# Patient Record
Sex: Male | Born: 1935
Health system: Southern US, Community
[De-identification: ages and names within clinical notes are randomized; demographics above are authoritative.]

## PROBLEM LIST (undated history)

## (undated) ENCOUNTER — Emergency Department (HOSPITAL_COMMUNITY): Discharge status: Absent without leave | Payer: Medicare Other

## (undated) DIAGNOSIS — E785 Hyperlipidemia, unspecified: Secondary | ICD-10-CM

## (undated) DIAGNOSIS — R7611 Nonspecific reaction to tuberculin skin test without active tuberculosis: Secondary | ICD-10-CM

## (undated) DIAGNOSIS — K219 Gastro-esophageal reflux disease without esophagitis: Secondary | ICD-10-CM

## (undated) DIAGNOSIS — B1910 Unspecified viral hepatitis B without hepatic coma: Secondary | ICD-10-CM

## (undated) DIAGNOSIS — K5790 Diverticulosis of intestine, part unspecified, without perforation or abscess without bleeding: Secondary | ICD-10-CM

## (undated) HISTORY — DX: Diverticulosis of intestine, part unspecified, without perforation or abscess without bleeding: K57.90

## (undated) HISTORY — PX: NO PAST SURGERIES: SHX2092

## (undated) HISTORY — DX: Hyperlipidemia, unspecified: E78.5

## (undated) HISTORY — DX: Gastro-esophageal reflux disease without esophagitis: K21.9

## (undated) HISTORY — DX: Unspecified viral hepatitis B without hepatic coma: B19.10

## (undated) HISTORY — DX: Nonspecific reaction to tuberculin skin test without active tuberculosis: R76.11

---

## 1997-08-13 ENCOUNTER — Emergency Department (HOSPITAL_COMMUNITY): Admission: EM | Admit: 1997-08-13 | Discharge: 1997-08-13 | Payer: Self-pay | Admitting: Emergency Medicine

## 2001-05-23 ENCOUNTER — Emergency Department (HOSPITAL_COMMUNITY): Admission: EM | Admit: 2001-05-23 | Discharge: 2001-05-23 | Payer: Self-pay | Admitting: Emergency Medicine

## 2001-06-07 ENCOUNTER — Observation Stay (HOSPITAL_COMMUNITY): Admission: EM | Admit: 2001-06-07 | Discharge: 2001-06-09 | Payer: Self-pay

## 2001-06-11 ENCOUNTER — Encounter: Admission: RE | Admit: 2001-06-11 | Discharge: 2001-06-11 | Payer: Self-pay | Admitting: Family Medicine

## 2001-07-01 ENCOUNTER — Emergency Department (HOSPITAL_COMMUNITY): Admission: EM | Admit: 2001-07-01 | Discharge: 2001-07-01 | Payer: Self-pay

## 2001-07-02 ENCOUNTER — Ambulatory Visit (HOSPITAL_COMMUNITY): Admission: RE | Admit: 2001-07-02 | Discharge: 2001-07-02 | Payer: Self-pay | Admitting: Family Medicine

## 2003-05-18 ENCOUNTER — Ambulatory Visit (HOSPITAL_COMMUNITY): Admission: RE | Admit: 2003-05-18 | Discharge: 2003-05-18 | Payer: Self-pay | Admitting: Internal Medicine

## 2007-02-16 ENCOUNTER — Ambulatory Visit: Payer: Self-pay | Admitting: Internal Medicine

## 2007-02-16 DIAGNOSIS — M549 Dorsalgia, unspecified: Secondary | ICD-10-CM | POA: Insufficient documentation

## 2007-03-12 ENCOUNTER — Ambulatory Visit: Payer: Self-pay | Admitting: Internal Medicine

## 2007-03-12 DIAGNOSIS — K219 Gastro-esophageal reflux disease without esophagitis: Secondary | ICD-10-CM | POA: Insufficient documentation

## 2007-03-12 DIAGNOSIS — R7611 Nonspecific reaction to tuberculin skin test without active tuberculosis: Secondary | ICD-10-CM | POA: Insufficient documentation

## 2007-03-16 ENCOUNTER — Telehealth (INDEPENDENT_AMBULATORY_CARE_PROVIDER_SITE_OTHER): Payer: Self-pay | Admitting: *Deleted

## 2007-03-16 LAB — CONVERTED CEMR LAB
ALT: 58 units/L — ABNORMAL HIGH (ref 0–53)
AST: 42 units/L — ABNORMAL HIGH (ref 0–37)
Albumin: 4.5 g/dL (ref 3.5–5.2)
Alkaline Phosphatase: 59 units/L (ref 39–117)
BUN: 16 mg/dL (ref 6–23)
Basophils Absolute: 0 10*3/uL (ref 0.0–0.1)
Basophils Relative: 0.2 % (ref 0.0–1.0)
Bilirubin, Direct: 0.1 mg/dL (ref 0.0–0.3)
CO2: 31 meq/L (ref 19–32)
Calcium: 9.9 mg/dL (ref 8.4–10.5)
Chloride: 102 meq/L (ref 96–112)
Cholesterol: 251 mg/dL (ref 0–200)
Creatinine, Ser: 1.1 mg/dL (ref 0.4–1.5)
Direct LDL: 147 mg/dL
Eosinophils Absolute: 0.2 10*3/uL (ref 0.0–0.6)
Eosinophils Relative: 3.1 % (ref 0.0–5.0)
GFR calc Af Amer: 85 mL/min
GFR calc non Af Amer: 70 mL/min
Glucose, Bld: 126 mg/dL — ABNORMAL HIGH (ref 70–99)
HCT: 48.1 % (ref 39.0–52.0)
HDL: 30.8 mg/dL — ABNORMAL LOW (ref >39.0)
Hemoglobin: 16.2 g/dL (ref 13.0–17.0)
Lymphocytes Relative: 38.2 % (ref 12.0–46.0)
MCHC: 33.7 g/dL (ref 30.0–36.0)
MCV: 78.6 fL (ref 78.0–100.0)
Monocytes Absolute: 0.4 10*3/uL (ref 0.2–0.7)
Monocytes Relative: 6 % (ref 3.0–11.0)
Neutro Abs: 3.9 10*3/uL (ref 1.4–7.7)
Neutrophils Relative %: 52.5 % (ref 43.0–77.0)
PSA: 2.95 ng/mL (ref 0.10–4.00)
Platelets: 132 10*3/uL — ABNORMAL LOW (ref 150–400)
Potassium: 4.7 meq/L (ref 3.5–5.1)
RBC: 6.12 M/uL — ABNORMAL HIGH (ref 4.22–5.81)
RDW: 12.7 % (ref 11.5–14.6)
Sodium: 140 meq/L (ref 135–145)
TSH: 0.45 microintl units/mL (ref 0.35–5.50)
Total Bilirubin: 0.8 mg/dL (ref 0.3–1.2)
Total CHOL/HDL Ratio: 8.1
Total Protein: 8.2 g/dL (ref 6.0–8.3)
Triglycerides: 283 mg/dL (ref 0–149)
VLDL: 57 mg/dL — ABNORMAL HIGH (ref 0–40)
WBC: 7.3 10*3/uL (ref 4.5–10.5)

## 2007-04-03 ENCOUNTER — Ambulatory Visit: Payer: Self-pay | Admitting: Internal Medicine

## 2007-05-18 ENCOUNTER — Encounter (INDEPENDENT_AMBULATORY_CARE_PROVIDER_SITE_OTHER): Payer: Self-pay | Admitting: *Deleted

## 2007-06-15 ENCOUNTER — Ambulatory Visit: Payer: Self-pay | Admitting: Internal Medicine

## 2007-06-15 ENCOUNTER — Encounter (INDEPENDENT_AMBULATORY_CARE_PROVIDER_SITE_OTHER): Payer: Self-pay | Admitting: *Deleted

## 2007-06-15 DIAGNOSIS — B1910 Unspecified viral hepatitis B without hepatic coma: Secondary | ICD-10-CM

## 2007-06-15 DIAGNOSIS — B181 Chronic viral hepatitis B without delta-agent: Secondary | ICD-10-CM | POA: Insufficient documentation

## 2007-06-15 DIAGNOSIS — E785 Hyperlipidemia, unspecified: Secondary | ICD-10-CM

## 2007-06-19 ENCOUNTER — Telehealth (INDEPENDENT_AMBULATORY_CARE_PROVIDER_SITE_OTHER): Payer: Self-pay | Admitting: *Deleted

## 2007-06-19 LAB — CONVERTED CEMR LAB
ALT: 36 units/L (ref 0–53)
AST: 36 units/L (ref 0–37)
Albumin: 4.1 g/dL (ref 3.5–5.2)
Alkaline Phosphatase: 64 units/L (ref 39–117)
Basophils Absolute: 0 10*3/uL (ref 0.0–0.1)
Basophils Relative: 0.3 % (ref 0.0–1.0)
Bilirubin, Direct: 0.1 mg/dL (ref 0.0–0.3)
Cholesterol: 262 mg/dL (ref 0–200)
Direct LDL: 190.6 mg/dL
Eosinophils Absolute: 0.2 10*3/uL (ref 0.0–0.7)
Eosinophils Relative: 2.7 % (ref 0.0–5.0)
HCT: 49.4 % (ref 39.0–52.0)
HDL: 30 mg/dL — ABNORMAL LOW (ref >39.0)
Hemoglobin: 16.1 g/dL (ref 13.0–17.0)
Hgb A1c MFr Bld: 5.2 % (ref 4.6–6.0)
Lymphocytes Relative: 45.5 % (ref 12.0–46.0)
MCHC: 32.7 g/dL (ref 30.0–36.0)
MCV: 81 fL (ref 78.0–100.0)
Monocytes Absolute: 0.5 10*3/uL (ref 0.1–1.0)
Monocytes Relative: 7.4 % (ref 3.0–12.0)
Neutro Abs: 2.8 10*3/uL (ref 1.4–7.7)
Neutrophils Relative %: 44.1 % (ref 43.0–77.0)
Platelets: 124 10*3/uL — ABNORMAL LOW (ref 150–400)
RBC: 6.1 M/uL — ABNORMAL HIGH (ref 4.22–5.81)
RDW: 13 % (ref 11.5–14.6)
Total Bilirubin: 1 mg/dL (ref 0.3–1.2)
Total CHOL/HDL Ratio: 8.7
Total Protein: 8.4 g/dL — ABNORMAL HIGH (ref 6.0–8.3)
Triglycerides: 163 mg/dL — ABNORMAL HIGH (ref 0–149)
VLDL: 33 mg/dL (ref 0–40)
WBC: 6.3 10*3/uL (ref 4.5–10.5)

## 2007-06-26 LAB — HM COLONOSCOPY

## 2007-07-01 ENCOUNTER — Ambulatory Visit: Payer: Self-pay | Admitting: Gastroenterology

## 2007-07-01 LAB — CONVERTED CEMR LAB
AFP-Tumor Marker: 4.9 ng/mL (ref 0.0–8.0)
HCV Ab: NEGATIVE
Hepatitis B Surface Ag: POSITIVE — AB

## 2007-07-02 ENCOUNTER — Ambulatory Visit: Payer: Self-pay | Admitting: Gastroenterology

## 2007-07-02 ENCOUNTER — Encounter: Payer: Self-pay | Admitting: Internal Medicine

## 2007-07-02 ENCOUNTER — Encounter: Payer: Self-pay | Admitting: Gastroenterology

## 2007-07-16 ENCOUNTER — Ambulatory Visit: Payer: Self-pay | Admitting: Gastroenterology

## 2007-09-08 ENCOUNTER — Ambulatory Visit: Payer: Self-pay | Admitting: Internal Medicine

## 2007-10-12 ENCOUNTER — Telehealth (INDEPENDENT_AMBULATORY_CARE_PROVIDER_SITE_OTHER): Payer: Self-pay | Admitting: *Deleted

## 2007-10-20 ENCOUNTER — Ambulatory Visit: Payer: Self-pay | Admitting: Internal Medicine

## 2007-10-27 ENCOUNTER — Telehealth (INDEPENDENT_AMBULATORY_CARE_PROVIDER_SITE_OTHER): Payer: Self-pay | Admitting: *Deleted

## 2007-10-27 LAB — CONVERTED CEMR LAB
BUN: 17 mg/dL (ref 6–23)
CO2: 30 meq/L (ref 19–32)
Cholesterol: 198 mg/dL (ref 0–200)
GFR calc Af Amer: 95 mL/min
Glucose, Bld: 90 mg/dL (ref 70–99)
HDL: 31.2 mg/dL — ABNORMAL LOW (ref >39.0)
Potassium: 4.3 meq/L (ref 3.5–5.1)
Total CK: 235 units/L (ref 7–195)
VLDL: 35 mg/dL (ref 0–40)

## 2008-01-19 ENCOUNTER — Telehealth (INDEPENDENT_AMBULATORY_CARE_PROVIDER_SITE_OTHER): Payer: Self-pay | Admitting: *Deleted

## 2008-03-03 ENCOUNTER — Ambulatory Visit: Payer: Self-pay | Admitting: Family Medicine

## 2008-09-19 ENCOUNTER — Ambulatory Visit: Payer: Self-pay | Admitting: Family Medicine

## 2009-03-13 ENCOUNTER — Ambulatory Visit: Payer: Self-pay | Admitting: Internal Medicine

## 2009-03-14 ENCOUNTER — Ambulatory Visit: Payer: Self-pay | Admitting: Internal Medicine

## 2009-03-22 LAB — CONVERTED CEMR LAB
AST: 27 units/L (ref 0–37)
Albumin: 4.3 g/dL (ref 3.5–5.2)
Alkaline Phosphatase: 61 units/L (ref 39–117)
BUN: 16 mg/dL (ref 6–23)
Basophils Absolute: 0.1 10*3/uL (ref 0.0–0.1)
Bilirubin, Direct: 0.1 mg/dL (ref 0.0–0.3)
CO2: 28 meq/L (ref 19–32)
Calcium: 9.7 mg/dL (ref 8.4–10.5)
Chloride: 104 meq/L (ref 96–112)
Creatinine, Ser: 1.1 mg/dL (ref 0.4–1.5)
Direct LDL: 162.1 mg/dL
Eosinophils Absolute: 0.7 10*3/uL (ref 0.0–0.7)
Glucose, Bld: 115 mg/dL — ABNORMAL HIGH (ref 70–99)
HDL: 32.7 mg/dL — ABNORMAL LOW (ref >39.00)
Lymphocytes Relative: 39.5 % (ref 12.0–46.0)
MCHC: 32.3 g/dL (ref 30.0–36.0)
MCV: 82.2 fL (ref 78.0–100.0)
Monocytes Absolute: 0.5 10*3/uL (ref 0.1–1.0)
Neutrophils Relative %: 43.7 % (ref 43.0–77.0)
PSA: 2.82 ng/mL (ref 0.10–4.00)
Platelets: 110 10*3/uL — ABNORMAL LOW (ref 150.0–400.0)
RDW: 13.4 % (ref 11.5–14.6)
Saturation Ratios: 44.7 % (ref 20.0–50.0)
Total Bilirubin: 0.9 mg/dL (ref 0.3–1.2)
Triglycerides: 308 mg/dL — ABNORMAL HIGH (ref 0.0–149.0)

## 2009-05-16 ENCOUNTER — Ambulatory Visit: Payer: Self-pay | Admitting: Internal Medicine

## 2009-05-16 DIAGNOSIS — R42 Dizziness and giddiness: Secondary | ICD-10-CM

## 2009-05-16 LAB — CONVERTED CEMR LAB
Blood in Urine, dipstick: NEGATIVE
Glucose, Urine, Semiquant: NEGATIVE
Ketones, urine, test strip: NEGATIVE
Nitrite: NEGATIVE
WBC Urine, dipstick: NEGATIVE
pH: 5

## 2009-07-03 ENCOUNTER — Ambulatory Visit: Payer: Self-pay | Admitting: Internal Medicine

## 2009-09-08 ENCOUNTER — Ambulatory Visit: Payer: Self-pay | Admitting: Internal Medicine

## 2009-09-12 LAB — CONVERTED CEMR LAB
ALT: 26 units/L (ref 0–53)
AST: 33 units/L (ref 0–37)
HDL: 33.3 mg/dL — ABNORMAL LOW (ref >39.00)
TSH: 0.68 microintl units/mL (ref 0.35–5.50)
Total CHOL/HDL Ratio: 5
Triglycerides: 197 mg/dL — ABNORMAL HIGH (ref 0.0–149.0)

## 2009-11-08 ENCOUNTER — Ambulatory Visit: Payer: Self-pay | Admitting: Internal Medicine

## 2010-03-27 NOTE — Assessment & Plan Note (Signed)
Summary: COMPLETE CPX AND LABS///SPH  Flu Vaccine Consent Questions     Do you have a history of severe allergic reactions to this vaccine? no    Any prior history of allergic reactions to egg and/or gelatin? no    Do you have a sensitivity to the preservative Thimersol? no    Do you have a past history of Guillan-Barre Syndrome? no    Do you currently have an acute febrile illness? no    Have you ever had a severe reaction to latex? no    Vaccine information given and explained to patient? yes    Are you currently pregnant? no    Lot Number:[id number]   Exp Date:08/24/2009   Site Given  Left Deltoid IM Shary Decamp  March 13, 2009 9:27 AM   Vital Signs:  Patient profile:   76 year old male Height:      60.75 inches Weight:      137.8 pounds BMI:     26.35 Pulse rate:   68 / minute BP sitting:   100 / 60  Vitals Entered By: Shary Decamp (March 13, 2009 8:28 AM) CC: yearly    History of Present Illness: here w/ wife and translator   yearly, chart reviewed  HYPERLIPIDEMIA -- off cholesterol med x > 1 year   LIVER FUNCTION TESTS, ABNORMAL  -- die for recheck   GERD  off meds x > 1year c/o burning at upper chest, cough, no dysphagia or odynophagia no N-V-D, no blood in stools       Allergies: No Known Drug Allergies  Past History:  Past Medical History: + PPD...Marland KitchenMarland KitchenWas Rx INH x 3 months (2008)--HD GERD, EGD 5-09:  : Duodenitis without Hemorrhage,  Esophageal Stricture.  Cscope 5-09: TICs Hyperlipidemia 5-09: + Hep BsAg, (-) Hep BsAb, (-) HepC  Past Surgical History: Reviewed history from 02/16/2007 and no changes required. no  Family History: F - ?MI- deceased 18 - deceased - old age colon ca-- prostate ca--  Social History: Married children x 5 from [place] tobacco-- quit in the 90s ETOH-- rarely  diet-- healthy, low fat  exercise-- active   Review of Systems General:  wt varies . CV:  Denies chest pain or discomfort and swelling of  feet. Resp:  Denies shortness of breath. GU:  Denies dysuria, hematuria, and urinary hesitancy; occasionally diff.  urinating.  Physical Exam  General:  alert, well-developed, and well-nourished.   Neck:  no masses and no thyromegaly.   Lungs:  normal respiratory effort, no intercostal retractions, no accessory muscle use, and normal breath sounds.   Heart:  normal rate, regular rhythm, no murmur, and no gallop.   Abdomen:  soft, non-tender, no hepatomegaly, and no splenomegaly.   Rectal:  No external abnormalities noted. Normal sphincter tone. No rectal masses or tenderness. Prostate:  Prostate gland firm and smooth, no enlargement, nodularity, tenderness, mass, asymmetry or induration.   Impression & Recommendations:  Problem # 1:  HYPERLIPIDEMIA (ICD-272.4) off meds x 1 year  labs  diet described as healthy  The following medications were removed from the medication list:    Zocor 40 Mg Tabs (Simvastatin) .Marland Kitchen... 1 by mouth once daily  Orders: TLB-BMP (Basic Metabolic Panel-BMET) (80048-METABOL) TLB-Lipid Panel (80061-LIPID)  Problem # 2:  HEALTH SCREENING (ICD-V70.0) translator:  [employer] 336 [id number] Td--TODAY pneumonia shot--2009 flu shot--TODAY  Cscope 5-09: TICs  PSA   Problem # 3:  LIVER FUNCTION TESTS, ABNORMAL (ICD-794.8) denies tylenol, rarely drinks w/u so far:  5-09: + Hep BsAg, (-) Hep BsAb, (-) HepC ordering labs   Orders: Venipuncture (14782) TLB-Hepatic/Liver Function Pnl (80076-HEPATIC) TLB-IBC Pnl (Iron/FE;Transferrin) (83550-IBC)  Problem # 4:  GERD (ICD-530.81) off meds x > 1 year cough likely related to GERD but will check a CXR re start Nexium His updated medication list for this problem includes:    Nexium 40 Mg Cpdr (Esomeprazole magnesium) .Marland Kitchen... 1 by mouth every morning  Orders: TLB-CBC Platelet - w/Differential (85025-CBCD) T-2 View CXR (71020TC)  Complete Medication List: 1)  Nexium 40 Mg Cpdr (Esomeprazole magnesium) .Marland Kitchen.. 1  by mouth every morning  Other Orders: TLB-PSA (Prostate Specific Antigen) (84153-PSA) Admin 1st Vaccine (95621) Flu Vaccine 78yrs + (30865) Administration Flu vaccine - MCR (G0008) TD Toxoids IM 7 YR + (78469) Admin of Any Addtl Vaccine (62952) Admin 1st Vaccine (State) [phone]) Admin of Any Addtl Vaccine (State) [phone])  Patient Instructions: 1)  TAKE NEXIUM EVERY DAY BEFORE BREAKFAST  2)  WILL COMMUNICATE WITH YOU THROUGH THE INTERPRETER WHEN YOUR RESULTS ARE READY 3)  CAME BACK IN 2 MONTHS  Prescriptions: NEXIUM 40 MG  CPDR (ESOMEPRAZOLE MAGNESIUM) 1 by mouth every morning  #90 x 3   Entered and Authorized by:   Elita Quick E. Chauntel Windsor MD   Signed by:   Nolon Rod. Janiqua Friscia MD on 03/13/2009   Method used:   Print then Give to Patient   RxID:   [id number]    Tetanus/Td Vaccine    Vaccine Type: Td    Site: right deltoid    Dose: 0.5 ml    Route: IM    Given by: Shary Decamp    Exp. Date: 04/29/2010    Lot #: [id number]

## 2010-03-27 NOTE — Assessment & Plan Note (Signed)
Summary: FLU SHOT///[facility]  Nurse Visit   Allergies: No Known Drug Allergies  Orders Added: 1)  Flu Vaccine 6yrs + MEDICARE PATIENTS [Q2039] 2)  Administration Flu vaccine - MCR [G0008] Flu Vaccine Consent Questions     Do you have a history of severe allergic reactions to this vaccine? no    Any prior history of allergic reactions to egg and/or gelatin? no    Do you have a sensitivity to the preservative Thimersol? no    Do you have a past history of Guillan-Barre Syndrome? no    Do you currently have an acute febrile illness? no    Have you ever had a severe reaction to latex? no    Vaccine information given and explained to patient? yes    Are you currently pregnant? no    Lot Number:[id number]   Exp Date:08/25/2010   Site Given Right Deltoid IM

## 2010-03-27 NOTE — Assessment & Plan Note (Signed)
Summary: fasting roa//lch   Vital Signs:  Patient profile:   76 year old male Weight:      134.38 pounds Pulse rate:   65 / minute Pulse rhythm:   regular BP sitting:   104 / 62  (left arm) Cuff size:   regular  Vitals Entered By: Army Fossa CMA (September 08, 2009 9:21 AM) CC: Pt here for Fasting OV. Comments Everything going okay.   History of Present Illness: here w/ translator Mrs Jamelle Haring in general feels well c/o fatigue sometimes after mid-day Good compliance with cholesterol medication When asked about "muscle aches"  he said sometimes, on and off, at different places (points to the abdomen, upper back); symptoms started after statins?  Allergies (verified): No Known Drug Allergies  Past History:  Past Medical History: Reviewed history from 03/13/2009 and no changes required. + PPD...Marland KitchenMarland KitchenWas Rx INH x 3 months (2008)--HD GERD, EGD 5-09:  : Duodenitis without Hemorrhage,  Esophageal Stricture.  Cscope 5-09: TICs Hyperlipidemia 5-09: + Hep BsAg, (-) Hep BsAb, (-) HepC  Past Surgical History: Reviewed history from 02/16/2007 and no changes required. no  Social History: Reviewed history from 03/13/2009 and no changes required. Married children x 5 from [place] tobacco-- quit in the 90s ETOH-- rarely  diet-- healthy, low fat  exercise-- active   Review of Systems       no chest pain No dyspnea on exertion No lower extremity edema  Physical Exam  General:  alert and well-developed.  no apparent distress Lungs:  normal respiratory effort, no intercostal retractions, no accessory muscle use, and normal breath sounds.   Heart:  normal rate, regular rhythm, and no murmur.   Abdomen:  soft, non-tender, no distention, no masses, no guarding, and no rigidity.   Msk:  upper and lower extremities are squeezed, it caused no pain   Impression & Recommendations:  Problem # 1:  HYPERLIPIDEMIA (ICD-272.4)  reports good compliance with medication Unclear if the  aches are due to simvastatin Plan Labs including CK is If the CKs are very high, will discontinue simvastatin Also through the translator, I asked him to call me if the  aches become more severe or frequent  His updated medication list for this problem includes:    Simvastatin 40 Mg Tabs (Simvastatin) ..... One at bedtime  Labs Reviewed: SGOT: 27 (03/13/2009)   SGPT: 29 (03/13/2009)   HDL:32.70 (03/13/2009), 31.2 (10/20/2007)  LDL:132 (10/20/2007), DEL (06/15/2007)  Chol:265 (03/13/2009), 198 (10/20/2007)  Trig:308.0 (03/13/2009), 173 (10/20/2007)  Orders: Venipuncture (04540) TLB-ALT (SGPT) (84460-ALT) TLB-AST (SGOT) (84450-SGOT) TLB-Lipid Panel (80061-LIPID) TLB-TSH (Thyroid Stimulating Hormone) (84443-TSH) TLB-CK Total Only(Creatine Kinase/CPK) (82550-CK) Specimen Handling (98119)  Complete Medication List: 1)  Simvastatin 40 Mg Tabs (Simvastatin) .... One at bedtime 2)  Nexium 40 Mg Cpdr (Esomeprazole magnesium) .... One by mouth daily as needed for heartburn  Patient Instructions: 1)  Please schedule a follow-up appointment in 6 months .  Prescriptions: NEXIUM 40 MG CPDR (ESOMEPRAZOLE MAGNESIUM) one by mouth daily as needed for heartburn  #30 x 6   Entered and Authorized by:   Nolon Rod. Paz MD   Signed by:   Nolon Rod. Paz MD on 09/08/2009   Method used:   Print then Give to Patient   RxID:   [id number] SIMVASTATIN 40 MG TABS (SIMVASTATIN) one at bedtime  #30 x 6   Entered and Authorized by:   Nolon Rod. Paz MD   Signed by:   Nolon Rod. Paz MD on 09/08/2009   Method used:  Print then Give to Patient   RxID:   [id number]

## 2010-03-27 NOTE — Assessment & Plan Note (Signed)
Summary: 2 mth fu/kdc--3/17--CALLED, CONFIRMED INTERPRETER///SPH   Vital Signs:  Patient profile:   76 year old male Height:      60.75 inches Weight:      138 pounds BMI:     26.38 Pulse rate:   60 / minute BP sitting:   130 / 70  Vitals Entered By: Shary Decamp (May 16, 2009 12:52 PM) CC: rov,c/o of urinary frequency   History of Present Illness: here for a routine office visit, translator present  Current Medications (verified): 1)  None  Allergies (verified): No Known Drug Allergies  Past History:  Past Medical History: Reviewed history from 03/13/2009 and no changes required. + PPD...Marland KitchenMarland KitchenWas Rx INH x 3 months (2008)--HD GERD, EGD 5-09:  : Duodenitis without Hemorrhage,  Esophageal Stricture.  Cscope 5-09: TICs Hyperlipidemia 5-09: + Hep BsAg, (-) Hep BsAb, (-) HepC  Past Surgical History: Reviewed history from 02/16/2007 and no changes required. no  Social History: Reviewed history from 03/13/2009 and no changes required. Married children x 5 from [place] tobacco-- quit in the 90s ETOH-- rarely  diet-- healthy, low fat  exercise-- active   Review of Systems       --GERD : he discontinue Nexium , because he is essentially asymptomatic, having heartburn rarely and only after certain foods --complaining of dizziness, on and off, last few seconds , trigger is standing up fast No associated headache, loss of consciousness or palpitations --urinary frequency?  What he tells me is that he urinates "right after I  drinks fluids".  Specifically denies difficulty urinating, hematuria or dysuria -- high cholesterol, on diet only, again states his diet is healthy  Physical Exam  General:  alert and well-developed.   Neck:  normal carotid pulses with no bruit Lungs:  normal respiratory effort, no intercostal retractions, no accessory muscle use, and normal breath sounds.   Heart:  normal rate, regular rhythm, no murmur, and no gallop.   Abdomen:  soft,  non-tender, no distention, and no masses.   Extremities:  no edema Neurologic:  AOx3 face symmetric EOMI speech fluent, motor and gait normal   Impression & Recommendations:  Problem # 1:  GERD (ICD-530.81) patient quite reluctant to take medication because he is essentially asymptomatic  will recommend to take Nexium at least p.r.n.  The following medications were removed from the medication list:    Nexium 40 Mg Cpdr (Esomeprazole magnesium) .Marland Kitchen... 1 by mouth every morning His updated medication list for this problem includes:    Nexium 40 Mg Cpdr (Esomeprazole magnesium) ..... One by mouth daily as needed for heartburn  Problem # 2:  DIZZINESS (ICD-780.4) see  ROS  likely benign dizziness observation recommend to stand up slowly to prevent falls  Problem # 3:  HYPERLIPIDEMIA (ICD-272.4) he has been off medications for a while, on diet only on further analisis  of his cardiovascular risk factor, he is high-risk mostly due to his age in the past he was prescribed statins  several times but he did not take it long term Plan: would recommend simvastatin again reason behind recommending simvastatin discussed with the patient via the  interpreter recommend good compliance office visit two months (I'm  slightly reluctant to prescribe aspirin due to his history of duodenitis) Labs Reviewed: SGOT: 27 (03/13/2009)   SGPT: 29 (03/13/2009)   HDL:32.70 (03/13/2009), 31.2 (10/20/2007)  LDL:132 (10/20/2007), DEL (06/15/2007)  Chol:265 (03/13/2009), 198 (10/20/2007)  Trig:308.0 (03/13/2009), 173 (10/20/2007)  His updated medication list for this problem includes:    Simvastatin  40 Mg Tabs (Simvastatin) ..... One at bedtime  Complete Medication List: 1)  Simvastatin 40 Mg Tabs (Simvastatin) .... One at bedtime 2)  Nexium 40 Mg Cpdr (Esomeprazole magnesium) .... One by mouth daily as needed for heartburn  Other Orders: UA Dipstick w/o Micro (manual) (16109)  Patient Instructions: 1)   Please schedule a follow-up appointment in 2 months --fasting Prescriptions: NEXIUM 40 MG CPDR (ESOMEPRAZOLE MAGNESIUM) one by mouth daily as needed for heartburn  #30 x 3   Entered and Authorized by:   Nolon Rod. Thomasene Dubow MD   Signed by:   Nolon Rod. Raynesha Tiedt MD on 05/16/2009   Method used:   Print then Give to Patient   RxID:   [id number] SIMVASTATIN 40 MG TABS (SIMVASTATIN) one at bedtime  #30 x 3   Entered and Authorized by:   Nolon Rod. Mandela Bello MD   Signed by:   Nolon Rod. Avante Carneiro MD on 05/16/2009   Method used:   Print then Give to Patient   RxID:   [id number]   Laboratory Results   Urine Tests    Routine Urinalysis   Glucose: negative   (Normal Range: Negative) Bilirubin: negative   (Normal Range: Negative) Ketone: negative   (Normal Range: Negative) Spec. Gravity: 1.015   (Normal Range: 1.003-1.035) Blood: negative   (Normal Range: Negative) pH: 5.0   (Normal Range: 5.0-8.0) Protein: negative   (Normal Range: Negative) Urobilinogen: 0.2   (Normal Range: 0-1) Nitrite: negative   (Normal Range: Negative) Leukocyte Esterace: negative   (Normal Range: Negative)

## 2010-03-27 NOTE — Assessment & Plan Note (Signed)
Summary: 2 MONTH FOLLOW UP-FASTING//PH   Vital Signs:  Patient profile:   76 year old male Height:      60.75 inches Weight:      135.6 pounds Pulse rate:   60 / minute BP sitting:   102 / 60  Vitals Entered By: Shary Decamp (Jul 03, 2009 9:52 AM) CC: rov, fasting, out of simvastatin x 1 month   History of Present Illness: here with a male translator , Mrs Jamelle Haring  feels well he  run out of cholesterol medication two months ago,  needs a refill  also c/o pain at at the lateral aspect of the right tight with exertion  Current Medications (verified): 1)  Simvastatin 40 Mg Tabs (Simvastatin) .... One At Bedtime 2)  Nexium 40 Mg Cpdr (Esomeprazole Magnesium) .... One By Mouth Daily As Needed For Heartburn  Allergies (verified): No Known Drug Allergies  Past History:  Past Medical History: Reviewed history from 03/13/2009 and no changes required. + PPD...Marland KitchenMarland KitchenWas Rx INH x 3 months (2008)--HD GERD, EGD 5-09:  : Duodenitis without Hemorrhage,  Esophageal Stricture.  Cscope 5-09: TICs Hyperlipidemia 5-09: + Hep BsAg, (-) Hep BsAb, (-) HepC  Past Surgical History: Reviewed history from 02/16/2007 and no changes required. no  Social History: Reviewed history from 03/13/2009 and no changes required. Married children x 5 from [place] tobacco-- quit in the 90s ETOH-- rarely  diet-- healthy, low fat  exercise-- active   Review of Systems       he is GERD symptoms are better, denies dysphasia or odynophagia he was seen also with cough few months ago, this is resolved, this was thought to be due to GERD  Physical Exam  General:  alert, well-developed, and well-nourished.   Lungs:  normal respiratory effort, no intercostal retractions, no accessory muscle use, and normal breath sounds.   Heart:  normal rate, regular rhythm, no murmur, and no gallop.   Abdomen:  soft, non-tender, no hepatomegaly, and no splenomegaly.   Pulses:  normal pedal and femoral pulses  B Extremities:  no edema   Impression & Recommendations:  Problem # 1:  GERD (ICD-530.81) reports he is feeling well he had cough, that is resolved His updated medication list for this problem includes:    Nexium 40 Mg Cpdr (Esomeprazole magnesium) ..... One by mouth daily as needed for heartburn     Problem # 2:  HYPERLIPIDEMIA (ICD-272.4) see previous comments history of poor compliance with medicines today, through the translator, I encouraged him to take simvastatin daily and come back for a follow-up in two months. Also he is aware that he is to call me if he has muscle aches, nausea vomiting or other  side effects His updated medication list for this problem includes:    Simvastatin 40 Mg Tabs (Simvastatin) ..... One at bedtime  Problem # 3:  pain in the right leg with exertion  a normal vascular exam, recommend observation, patient to let me know if symptoms increase  Complete Medication List: 1)  Simvastatin 40 Mg Tabs (Simvastatin) .... One at bedtime 2)  Nexium 40 Mg Cpdr (Esomeprazole magnesium) .... One by mouth daily as needed for heartburn  Patient Instructions: 1)  take your blood pressure medication daily 2)  tape they GERD medication as prescribed 3)  come back to this office in two months, fasting   Prescriptions: NEXIUM 40 MG CPDR (ESOMEPRAZOLE MAGNESIUM) one by mouth daily as needed for heartburn  #30 x 3   Entered and Authorized  by:   Nolon Rod Daschel Roughton MD   Signed by:   Nolon Rod. Irelynn Schermerhorn MD on 07/03/2009   Method used:   Print then Give to Patient   RxID:   [id number] SIMVASTATIN 40 MG TABS (SIMVASTATIN) one at bedtime  #30 x 3   Entered and Authorized by:   Nolon Rod. Insiya Oshea MD   Signed by:   Nolon Rod. Adric Wrede MD on 07/03/2009   Method used:   Print then Give to Patient   RxID:   [id number]

## 2010-03-27 NOTE — Assessment & Plan Note (Signed)
Summary: 2 MONTH FOLLOW UP-FASTING//PH   Vital Signs:  Patient profile:   76 year old male Height:      60.75 inches Weight:      135.6 pounds BMI:     25.93 Pulse rate:   60 / minute BP sitting:   122 / 64  Vitals Entered By: Shary Decamp (Jun 27, 2009 8:40 AM) CC: rov   History of Present Illness: will reschedule, no interpreter present  Current Medications (verified): 1)  Simvastatin 40 Mg Tabs (Simvastatin) .... One At Bedtime 2)  Nexium 40 Mg Cpdr (Esomeprazole Magnesium) .... One By Mouth Daily As Needed For Heartburn  Allergies (verified): No Known Drug Allergies   Complete Medication List: 1)  Simvastatin 40 Mg Tabs (Simvastatin) .... One at bedtime 2)  Nexium 40 Mg Cpdr (Esomeprazole magnesium) .... One by mouth daily as needed for heartburn  Other Orders: No Charge Patient Arrived (NCPA0) (NCPA0)

## 2010-11-13 ENCOUNTER — Encounter: Payer: Self-pay | Admitting: Internal Medicine

## 2010-12-28 ENCOUNTER — Encounter: Payer: Self-pay | Admitting: Internal Medicine

## 2011-01-01 ENCOUNTER — Encounter: Payer: Self-pay | Admitting: Internal Medicine

## 2011-01-01 ENCOUNTER — Ambulatory Visit (INDEPENDENT_AMBULATORY_CARE_PROVIDER_SITE_OTHER): Payer: Medicare Other | Admitting: Internal Medicine

## 2011-01-01 VITALS — BP 124/80 | HR 87 | Temp 98.1°F | Ht 61.0 in | Wt 135.8 lb

## 2011-01-01 DIAGNOSIS — Z125 Encounter for screening for malignant neoplasm of prostate: Secondary | ICD-10-CM

## 2011-01-01 DIAGNOSIS — R945 Abnormal results of liver function studies: Secondary | ICD-10-CM

## 2011-01-01 DIAGNOSIS — K219 Gastro-esophageal reflux disease without esophagitis: Secondary | ICD-10-CM

## 2011-01-01 DIAGNOSIS — R350 Frequency of micturition: Secondary | ICD-10-CM

## 2011-01-01 DIAGNOSIS — N400 Enlarged prostate without lower urinary tract symptoms: Secondary | ICD-10-CM

## 2011-01-01 DIAGNOSIS — Z0000 Encounter for general adult medical examination without abnormal findings: Secondary | ICD-10-CM

## 2011-01-01 DIAGNOSIS — E785 Hyperlipidemia, unspecified: Secondary | ICD-10-CM

## 2011-01-01 DIAGNOSIS — M549 Dorsalgia, unspecified: Secondary | ICD-10-CM

## 2011-01-01 HISTORY — DX: Benign prostatic hyperplasia without lower urinary tract symptoms: N40.0

## 2011-01-01 MED ORDER — ESOMEPRAZOLE MAGNESIUM 40 MG PO PACK: 40.0000 mg | PACK | Freq: Every day | ORAL | Status: DC

## 2011-01-01 MED ORDER — ZOSTER VACCINE LIVE 19400 UNT/0.65ML ~~LOC~~ SOLR: 0.6500 mL | Freq: Once | SUBCUTANEOUS | Status: AC

## 2011-01-01 MED ORDER — TAMSULOSIN HCL 0.4 MG PO CAPS: 0.4000 mg | ORAL_CAPSULE | ORAL | Status: DC

## 2011-01-01 MED ORDER — SIMVASTATIN 40 MG PO TABS: 40.0000 mg | ORAL_TABLET | Freq: Every day | ORAL | Status: DC

## 2011-01-01 NOTE — Progress Notes (Signed)
  Subjective:    Patient ID: Kevin Hutchinson, male    DOB: [birth year], 76 y.o.   MRN: [id number]  HPI Here with a translator CPX--chart reviewed, see assessment and plan 1 year history of nocturia, wakes up to urinate several times a night. Denies any blood in the urine, occasionally has dysuria. Has noted some flank pain on the left sometimes--->  on an off for years. Worse with certain movements. High cholesterol---> ran out of medications a couple months ago. GERD--run out of medications while back, complains of occasional heartburn.   Past Medical History: + PPD...Marland KitchenMarland KitchenWas Rx INH x 3 months (2008)--HD GERD, EGD 5-09:  : Duodenitis without Hemorrhage,  Esophageal Stricture.  Cscope 5-09: TICs Hyperlipidemia 5-09: + Hep BsAg, (-) Hep BsAb, (-) HepC  Past Surgical History: no  Family History: F - ?MI- deceased 28 - deceased - old age colon ca-- no prostate ca--no  Social History: Married children x 5 from [place] tobacco-- quit in the 90s ETOH-- rarely  diet-- healthy, low fat  exercise-- active   Review of Systems Denies fever or chills No nausea, vomiting or blood in the stools. No chest pain, very rarely has mild shortness of breath with exertion. No anxiety or depression.     Objective:   Physical Exam  Constitutional: He appears well-developed and well-nourished. No distress.  HENT:  Head: Normocephalic and atraumatic.  Neck: No thyromegaly present.       Normal carotid pulse  Cardiovascular: Normal rate, regular rhythm and normal heart sounds.   No murmur heard. Pulmonary/Chest: Effort normal and breath sounds normal. No respiratory distress. He has no wheezes. He has no rales.  Abdominal: Soft. Bowel sounds are normal. He exhibits no distension. There is no tenderness. There is no rebound and no guarding.  Genitourinary:       Prostate slightly enlarged, nontender, no nodule.  Musculoskeletal: He exhibits no edema.  Neurological: He is alert.  Skin: He is not  diaphoretic.  Psychiatric: He has a normal mood and affect. His behavior is normal.      Assessment & Plan:

## 2011-01-01 NOTE — Patient Instructions (Signed)
Read medication list carefully Came back in 6 weeks, fasting

## 2011-01-01 NOTE — Assessment & Plan Note (Signed)
Td--2011 pneumonia shot--2009 Had a flu shot already Benefits of zostavax discussed, Rx provided  Cscope 5-09: TICs Diet-exercise discussed

## 2011-01-01 NOTE — Assessment & Plan Note (Signed)
C/o heartburn sometimes, not taking PPIs: RF meds

## 2011-01-01 NOTE — Assessment & Plan Note (Signed)
H/o back pain, now with ill defined flank pain Muscle skeletal issue? Checking a UCX

## 2011-01-01 NOTE — Assessment & Plan Note (Addendum)
Sx c/w BPH, on eaxam gland is slt enlarged Plan: PSA UCX--- trewat w/ abx if appropiate  Trial w/  flomax  Call results to [name] : [name] [phone]

## 2011-01-01 NOTE — Assessment & Plan Note (Signed)
recheck

## 2011-01-01 NOTE — Assessment & Plan Note (Addendum)
Off cholesterol meds x a while: restart meds RTC 6 weeks , fasting

## 2011-01-02 LAB — POCT URINALYSIS DIPSTICK
Bilirubin, UA: NEGATIVE
Glucose, UA: NEGATIVE
Ketones, UA: NEGATIVE

## 2011-01-02 LAB — ALT: ALT: 143 U/L — ABNORMAL HIGH (ref 0–53)

## 2011-01-02 LAB — AST: AST: 86 U/L — ABNORMAL HIGH (ref 0–37)

## 2011-01-02 LAB — BASIC METABOLIC PANEL
Chloride: 104 mEq/L (ref 96–112)
Potassium: 4.8 mEq/L (ref 3.5–5.1)
Sodium: 142 mEq/L (ref 135–145)

## 2011-01-04 LAB — URINE CULTURE
Colony Count: NO GROWTH
Organism ID, Bacteria: NO GROWTH

## 2011-01-07 ENCOUNTER — Telehealth: Payer: Self-pay

## 2011-01-07 NOTE — Telephone Encounter (Signed)
   Called to give results to [name] at [phone] Liver test elevated DO NOT TAKE CHOLESTEROL MEDICINE AS RECOMMENDED DURING THE LAST VISIT (simvastatin) Came back in 6 weeks as planned (will need blood work to recheck liver and a chronic hepatitis panel

## 2011-01-07 NOTE — Telephone Encounter (Signed)
Message copied by Francisco Capuchin on Mon Jan 07, 2011  4:12 PM ------      Message from: Wanda Plump      Created: Sun Jan 06, 2011 12:32 PM       Call results to pt's Sumedh : [name] [phone]      Liver test elevated      DO NOT TAKE CHOLESTEROL MEDICINE AS RECOMMENDED DURING THE LAST VISIT (simvastatin)      Came back in 6 weeks as planned (will need blood work to recheck liver and a chronic hepatitis panel)

## 2011-01-07 NOTE — Telephone Encounter (Signed)
Message copied by Francisco Capuchin on Mon Jan 07, 2011  4:08 PM ------      Message from: Wanda Plump      Created: Sun Jan 06, 2011 12:32 PM       Call results to pt's Javontae : [name] [phone]      Liver test elevated      DO NOT TAKE CHOLESTEROL MEDICINE AS RECOMMENDED DURING THE LAST VISIT (simvastatin)      Came back in 6 weeks as planned (will need blood work to recheck liver and a chronic hepatitis panel)

## 2011-01-07 NOTE — Telephone Encounter (Signed)
Message copied by Francisco Capuchin on Mon Jan 07, 2011  3:59 PM ------      Message from: Wanda Plump      Created: Sun Jan 06, 2011 12:32 PM       Call results to pt's Kipp : [name] [phone]      Liver test elevated      DO NOT TAKE CHOLESTEROL MEDICINE AS RECOMMENDED DURING THE LAST VISIT (simvastatin)      Came back in 6 weeks as planned (will need blood work to recheck liver and a chronic hepatitis panel)

## 2011-03-27 ENCOUNTER — Encounter: Payer: Self-pay | Admitting: Internal Medicine

## 2011-03-27 ENCOUNTER — Ambulatory Visit (INDEPENDENT_AMBULATORY_CARE_PROVIDER_SITE_OTHER): Payer: Medicare Other | Admitting: Internal Medicine

## 2011-03-27 VITALS — BP 128/84 | HR 78 | Temp 98.2°F | Wt 131.0 lb

## 2011-03-27 DIAGNOSIS — R945 Abnormal results of liver function studies: Secondary | ICD-10-CM

## 2011-03-27 DIAGNOSIS — N400 Enlarged prostate without lower urinary tract symptoms: Secondary | ICD-10-CM

## 2011-03-27 DIAGNOSIS — E785 Hyperlipidemia, unspecified: Secondary | ICD-10-CM

## 2011-03-27 LAB — HEPATIC FUNCTION PANEL
Alkaline Phosphatase: 68 U/L (ref 39–117)
Bilirubin, Direct: 0 mg/dL (ref 0.0–0.3)

## 2011-03-27 NOTE — Assessment & Plan Note (Signed)
Currently not taking any medication and asymptomatic

## 2011-03-27 NOTE — Assessment & Plan Note (Addendum)
I recommended not to take cholesterol medication due to increased LFTs, patient reports he has not been taking medicines. Addendum, LFTs completely normal, likely intolerant to statins

## 2011-03-27 NOTE — Progress Notes (Signed)
  Subjective:    Patient ID: Kevin Hutchinson, male    DOB: Sep 13, 1934, 77 y.o.   MRN: [id number]  HPI F/u from last visit, here w/a translator  Past Medical History:  + PPD...Marland KitchenMarland KitchenWas Rx INH x 3 months (2008)--HD  GERD, EGD 5-09: : Duodenitis without Hemorrhage, Esophageal Stricture.  Cscope 5-09: TICs  Hyperlipidemia  5-09: + Hep BsAg, (-) Hep BsAb, (-) HepC   Past Surgical History:  no   Family History:  F - ?MI- deceased  58 - deceased - old age  colon ca-- no  prostate ca--no   Social History:  Married , children x 5  from [place]  tobacco-- quit in the 90s  ETOH-- rarely  diet-- healthy, low fat  exercise-- active    Review of Systems Today, he reports a 5 year history of on and off left lower back pain, it radiates actually to both legs. Denies any fever chills, bladder or bowel incontinence.  Medication list reviewed, states that the only thing he is taking is "the stomach medicine". Denies difficulty urinating, bladder or bowel incontinence. Please notice that communication even with a translator is difficult, unable to give me straight yes or no answers.     Objective:   Physical Exam  Constitutional: He appears well-developed and well-nourished.  HENT:  Head: Normocephalic and atraumatic.  Cardiovascular: Normal rate, regular rhythm and normal heart sounds.   No murmur heard. Pulmonary/Chest: Effort normal and breath sounds normal. No respiratory distress. He has no wheezes. He has no rales.  Abdominal: Soft. He exhibits no distension. There is no tenderness. There is no rebound and no guarding.       No organomegaly  Musculoskeletal: He exhibits no edema.       Nontender to palpation  Neurological:       DTRs and  motor strength symmetric, gait normal  Skin:       No erythema palmaris or chest telangiectasias          Assessment & Plan:

## 2011-03-27 NOTE — Assessment & Plan Note (Addendum)
denies tylenol, rarely drinks w/u so far:  5-09: + Hep BsAg, (-) Hep BsAb, (-) HepC 02-2009    Iron slightly elevated at 185, transferrin normal. Last liver ultrasound 2005. No evidenc of chronic liver dz on eaxm Plan: Refer to GI, labs Addendum, LFTs came back completely normal so is likely that they were elevated the cholesterol medication. Hold GI referral. See phone note

## 2011-03-27 NOTE — Patient Instructions (Signed)
Come back in 6 months with a translator for a routine checkup

## 2011-03-28 MED ORDER — EZETIMIBE 10 MG PO TABS: 10.0000 mg | ORAL_TABLET | Freq: Every day | ORAL | Status: DC

## 2011-03-28 NOTE — Progress Notes (Signed)
Addended by: Edwena Felty T on: 03/28/2011 12:04 PM   Modules accepted: Orders

## 2011-03-28 NOTE — Progress Notes (Signed)
Addended by: Edwena Felty T on: 03/28/2011 09:26 AM   Modules accepted: Orders

## 2011-03-29 MED ORDER — EZETIMIBE 10 MG PO TABS: 10.0000 mg | ORAL_TABLET | Freq: Every day | ORAL | Status: DC

## 2011-03-29 NOTE — Progress Notes (Signed)
Addended by: Edwena Felty T on: 03/29/2011 08:09 AM   Modules accepted: Orders

## 2011-11-28 ENCOUNTER — Ambulatory Visit (INDEPENDENT_AMBULATORY_CARE_PROVIDER_SITE_OTHER): Payer: Medicare Other | Admitting: *Deleted

## 2011-11-28 DIAGNOSIS — Z23 Encounter for immunization: Secondary | ICD-10-CM

## 2012-06-24 ENCOUNTER — Ambulatory Visit (INDEPENDENT_AMBULATORY_CARE_PROVIDER_SITE_OTHER): Payer: Medicare Other | Admitting: Internal Medicine

## 2012-06-24 ENCOUNTER — Encounter: Payer: Self-pay | Admitting: Internal Medicine

## 2012-06-24 VITALS — BP 120/74 | HR 67 | Ht 63.5 in | Wt 132.0 lb

## 2012-06-24 DIAGNOSIS — N400 Enlarged prostate without lower urinary tract symptoms: Secondary | ICD-10-CM

## 2012-06-24 DIAGNOSIS — R945 Abnormal results of liver function studies: Secondary | ICD-10-CM

## 2012-06-24 DIAGNOSIS — R7611 Nonspecific reaction to tuberculin skin test without active tuberculosis: Secondary | ICD-10-CM

## 2012-06-24 DIAGNOSIS — Z0000 Encounter for general adult medical examination without abnormal findings: Secondary | ICD-10-CM

## 2012-06-24 DIAGNOSIS — K219 Gastro-esophageal reflux disease without esophagitis: Secondary | ICD-10-CM

## 2012-06-24 DIAGNOSIS — E785 Hyperlipidemia, unspecified: Secondary | ICD-10-CM

## 2012-06-24 LAB — CBC WITH DIFFERENTIAL/PLATELET
Basophils Absolute: 0 10*3/uL (ref 0.0–0.1)
Basophils Relative: 0.5 % (ref 0.0–3.0)
HCT: 47.6 % (ref 39.0–52.0)
Hemoglobin: 16 g/dL (ref 13.0–17.0)
Lymphs Abs: 3.4 10*3/uL (ref 0.7–4.0)
Monocytes Relative: 6.3 % (ref 3.0–12.0)
Neutro Abs: 3.2 10*3/uL (ref 1.4–7.7)
RBC: 6 Mil/uL — ABNORMAL HIGH (ref 4.22–5.81)
RDW: 13.7 % (ref 11.5–14.6)

## 2012-06-24 LAB — BASIC METABOLIC PANEL
CO2: 31 mEq/L (ref 19–32)
GFR: 86.13 mL/min (ref >60.00)
Glucose, Bld: 109 mg/dL — ABNORMAL HIGH (ref 70–99)
Potassium: 4.1 mEq/L (ref 3.5–5.1)
Sodium: 138 mEq/L (ref 135–145)

## 2012-06-24 LAB — LDL CHOLESTEROL, DIRECT: Direct LDL: 151 mg/dL

## 2012-06-24 LAB — LIPID PANEL
Total CHOL/HDL Ratio: 7
VLDL: 46.4 mg/dL — ABNORMAL HIGH (ref 0.0–40.0)

## 2012-06-24 MED ORDER — ZOSTER VACCINE LIVE 19400 UNT/0.65ML ~~LOC~~ SOLR: 0.6500 mL | Freq: Once | SUBCUTANEOUS | Status: DC

## 2012-06-24 MED ORDER — ESOMEPRAZOLE MAGNESIUM 40 MG PO PACK: 40.0000 mg | PACK | Freq: Every day | ORAL | Status: DC

## 2012-06-24 NOTE — Assessment & Plan Note (Addendum)
Td--2011 pneumonia shot--2009 zostavax   Rx was provided 2012 , apparently did not get, new rx issued  Cscope 5-09: TICs DRE PSA 12-2010, next due 12-2012 Diet-exercise discussed

## 2012-06-24 NOTE — Assessment & Plan Note (Signed)
labs

## 2012-06-24 NOTE — Assessment & Plan Note (Signed)
asx , see ROS

## 2012-06-24 NOTE — Assessment & Plan Note (Addendum)
occ dysphagia, not on PPIs, h/o stricture Re start PPIs, if sx severe to let me know. Re asses 6 months, GI ref if needed

## 2012-06-24 NOTE — Assessment & Plan Note (Signed)
Minimal sx, observe

## 2012-06-24 NOTE — Progress Notes (Signed)
  Subjective:    Patient ID: Kevin Hutchinson, male    DOB: 18-Sep-1934, 78 y.o.   MRN: [id number]  HPI Here w/ a translator  Here for Medicare AWV: 1. Risk factors based on Past M, S, F history: reviewed 2. Physical Activities:  Remains active 3. Depression/mood:  (-) screening 4. Hearing: reports L hearing decreased, saw a specialist, declined a hearing aid ($)   5. ADL's:  Independent, drives  6. Fall Risk: no recent falls, see instructions (asked translator to discuss w/ him) 7. home Safety: does feel safe at home  8. Height, weight, &visual acuity: see VS, uses glasses  9. Counseling: provided 10. Labs ordered based on risk factors: if needed  11. Referral Coordination: if needed 12.  Care Plan, see assessment and plan  13.   Cognitive Assessment: seems to be doing well emotionally, normal motor abilities   In addition, today we discussed the following: GERD--  Not taking Nexium for a while, no side effects, he simply ran out. Reports occasional dysphagia on burning in the epigastrium. History of positive PPD, denies fever, chills or weight loss. History hyperlipidemia, has a healthy diet, on no medications.   Past Medical History:   + PPD...Marland KitchenMarland KitchenWas Rx INH x 3 months (2008)--HD   GERD, EGD 5-09: : Duodenitis without Hemorrhage, Esophageal Stricture.   Cscope 5-09: TICs   Hyperlipidemia   5-09: + Hep BsAg, (-) Hep BsAb, (-) HepC   Past Surgical History:   no   Family History:   F - ?MI- deceased   73 - deceased - old age   colon ca-- no   prostate ca--no   Social History:   Married , children x 5, lives w/ wife from [place]   tobacco-- quit in the 90s   ETOH-- rarely   diet-- healthy, low fat      Review of Systems Denies chest pain or shortness or breath No cough or lower extremity edema. Occasional knee pain. No dysuria, hematuria or difficulty urinating. No nausea, vomiting, diarrhea or blood in the stools. No change in the color of the stools.     Objective:    Physical Exam  BP 120/74  Pulse 67  Ht 5' 3.5" (1.613 m)  Wt 132 lb (59.875 kg)  BMI 23.01 kg/m2  SpO2 99% General -- alert, well-developed, No apparent distress  Neck --no thyromegaly Lungs -- normal respiratory effort, no intercostal retractions, no accessory muscle use, and normal breath sounds.   Heart-- normal rate, regular rhythm, no murmur, and no gallop.   Abdomen--soft, non-tender, no distention, no masses, no HSM, no guarding, and no rigidity.   Extremities-- no pretibial edema bilaterally Neurologic-- alert , No apparent distress, speech gait and motor are intact. Psych-- Interview is done through a translator, he seems to be very well emotionally.      Assessment & Plan:

## 2012-06-24 NOTE — Patient Instructions (Addendum)
Restart nexium daily If the difficulty swallowing increases, let me know Come back in 6 months   Fall Prevention and Home Safety Falls cause injuries and can affect all age groups. It is possible to use preventive measures to significantly decrease the likelihood of falls. There are many simple measures which can make your home safer and prevent falls. OUTDOORS  Repair cracks and edges of walkways and driveways.  Remove high doorway thresholds.  Trim shrubbery on the main path into your home.  Have good outside lighting.  Clear walkways of tools, rocks, debris, and clutter.  Check that handrails are not broken and are securely fastened. Both sides of steps should have handrails.  Have leaves, snow, and ice cleared regularly.  Use sand or salt on walkways during winter months.  In the garage, clean up grease or oil spills. BATHROOM  Install night lights.  Install grab bars by the toilet and in the tub and shower.  Use non-skid mats or decals in the tub or shower.  Place a plastic non-slip stool in the shower to sit on, if needed.  Keep floors dry and clean up all water on the floor immediately.  Remove soap buildup in the tub or shower on a regular basis.  Secure bath mats with non-slip, double-sided rug tape.  Remove throw rugs and tripping hazards from the floors. BEDROOMS  Install night lights.  Make sure a bedside light is easy to reach.  Do not use oversized bedding.  Keep a telephone by your bedside.  Have a firm chair with side arms to use for getting dressed.  Remove throw rugs and tripping hazards from the floor. KITCHEN  Keep handles on pots and pans turned toward the center of the stove. Use back burners when possible.  Clean up spills quickly and allow time for drying.  Avoid walking on wet floors.  Avoid hot utensils and knives.  Position shelves so they are not too high or low.  Place commonly used objects within easy reach.  If  necessary, use a sturdy step stool with a grab bar when reaching.  Keep electrical cables out of the way.  Do not use floor polish or wax that makes floors slippery. If you must use wax, use non-skid floor wax.  Remove throw rugs and tripping hazards from the floor. STAIRWAYS  Never leave objects on stairs.  Place handrails on both sides of stairways and use them. Fix any loose handrails. Make sure handrails on both sides of the stairways are as long as the stairs.  Check carpeting to make sure it is firmly attached along stairs. Make repairs to worn or loose carpet promptly.  Avoid placing throw rugs at the top or bottom of stairways, or properly secure the rug with carpet tape to prevent slippage. Get rid of throw rugs, if possible.  Have an electrician put in a light switch at the top and bottom of the stairs. OTHER FALL PREVENTION TIPS  Wear low-heel or rubber-soled shoes that are supportive and fit well. Wear closed toe shoes.  When using a stepladder, make sure it is fully opened and both spreaders are firmly locked. Do not climb a closed stepladder.  Add color or contrast paint or tape to grab bars and handrails in your home. Place contrasting color strips on first and last steps.  Learn and use mobility aids as needed. Install an electrical emergency response system.  Turn on lights to avoid dark areas. Replace light bulbs that burn out immediately.  Get light switches that glow.  Arrange furniture to create clear pathways. Keep furniture in the same place.  Firmly attach carpet with non-skid or double-sided tape.  Eliminate uneven floor surfaces.  Select a carpet pattern that does not visually hide the edge of steps.  Be aware of all pets. OTHER HOME SAFETY TIPS  Set the water temperature for 120 F (48.8 C).  Keep emergency numbers on or near the telephone.  Keep smoke detectors on every level of the home and near sleeping areas. Document Released: 02/01/2002  Document Revised: 08/13/2011 Document Reviewed: 05/03/2011 [facility] Patient Information 2013 [place], [place].

## 2012-07-01 ENCOUNTER — Encounter: Payer: Self-pay | Admitting: *Deleted

## 2012-07-08 ENCOUNTER — Ambulatory Visit (INDEPENDENT_AMBULATORY_CARE_PROVIDER_SITE_OTHER): Payer: Medicare Other | Admitting: Internal Medicine

## 2012-07-08 ENCOUNTER — Encounter: Payer: Self-pay | Admitting: Internal Medicine

## 2012-07-08 VITALS — BP 136/84 | HR 73 | Wt 135.0 lb

## 2012-07-08 DIAGNOSIS — R945 Abnormal results of liver function studies: Secondary | ICD-10-CM

## 2012-07-08 DIAGNOSIS — E785 Hyperlipidemia, unspecified: Secondary | ICD-10-CM

## 2012-07-08 DIAGNOSIS — R7309 Other abnormal glucose: Secondary | ICD-10-CM

## 2012-07-08 LAB — ALT: ALT: 156 U/L — ABNORMAL HIGH (ref 0–53)

## 2012-07-08 LAB — APTT: aPTT: 28.8 s (ref 21.7–28.8)

## 2012-07-08 NOTE — Assessment & Plan Note (Addendum)
LFTs were again elevated, today he denies tylenol or ETOH He takes a OTC supplement called "Renette Butters Cordyceps" and a multivitamin w/u In the past included:  5-09: + Hep BsAg, (-) Hep BsAb, (-) HepC 02-2009   -Iron slightly elevated at 185, transferrin normal. Last liver ultrasound 2005 (-)  Mild thrombocytopenia related to liver dz? Plan: Repeat a liver ultrasound. Repeat LFTs and hep B serology GI referral Stop herbal supplemets, ok MVI

## 2012-07-08 NOTE — Assessment & Plan Note (Signed)
Consider treatment at some point, will investigate LFT elevation first

## 2012-07-08 NOTE — Patient Instructions (Addendum)
Come back in 4 months Stop "golden Cordyceps" Ok to take the multivitamin We are scheduling a ultrasound of the liver and a visit with the GI doctor

## 2012-07-08 NOTE — Progress Notes (Signed)
  Subjective:    Patient ID: ALA CAPRI, male    DOB: 1934-08-15, 78 y.o.   MRN: [id number]  HPI Here with a translator to discuss his labs here LFTs are quite elevated, platelets are slightly low, blood  sugars are slightly high, LDL is 151.  Past Medical History  Diagnosis Date  . GERD (gastroesophageal reflux disease)     EGD 5-09: : Duodenitis without Hemorrhage, Esophageal Stricture.  . Hyperlipidemia   . Elevated LFTs   . PPD positive     Was Rx INH x 3 months (2008)--HD     Past Surgical History  Procedure Laterality Date  . No past surgeries        Review of Systems He is not taking Tylenol and does not drink alcohol. Denies any nausea, vomiting. Occasional fatigue. He takes a multivitamin daily. Also takes "[place] Cordyceps" herbal  supplements to help nocturia      Objective:   Physical Exam BP 136/84  Pulse 73  Wt 135 lb (61.236 kg)  BMI 23.54 kg/m2  SpO2 98%  General -- alert, well-developed, NAD    HEENT -- not pale-jaundice Lungs -- normal respiratory effort, no intercostal retractions, no accessory muscle use, and normal breath sounds.   Abdomen--soft, non-tender, no distention, no masses, no HSM, no guarding, and no rigidity.   Extremities-- no pretibial edema bilaterally, no palmar erythema  Neurologic-- alert & oriented X3 and strength normal in all extremities. Psych--  not anxious appearing and not depressed appearing.       Assessment & Plan:

## 2012-07-08 NOTE — Assessment & Plan Note (Signed)
Last CBG slt elevated, check a A1C

## 2012-07-09 ENCOUNTER — Encounter: Payer: Self-pay | Admitting: Gastroenterology

## 2012-07-09 LAB — HEPATITIS B CORE ANTIBODY, IGM: Hep B C IgM: NEGATIVE

## 2012-07-09 LAB — HEPATITIS B SURFACE ANTIGEN: Hepatitis B Surface Ag: POSITIVE — AB

## 2012-07-09 LAB — HEPATITIS B SURFACE ANTIBODY,QUALITATIVE: Hep B S Ab: NONREACTIVE

## 2012-07-13 ENCOUNTER — Ambulatory Visit
Admission: RE | Admit: 2012-07-13 | Discharge: 2012-07-13 | Disposition: A | Discharge status: Home / Self Care | Payer: Medicare Other | Source: Ambulatory Visit | Attending: Internal Medicine | Admitting: Internal Medicine

## 2012-07-13 DIAGNOSIS — R945 Abnormal results of liver function studies: Secondary | ICD-10-CM

## 2012-07-15 ENCOUNTER — Encounter: Payer: Self-pay | Admitting: *Deleted

## 2012-08-05 ENCOUNTER — Other Ambulatory Visit (INDEPENDENT_AMBULATORY_CARE_PROVIDER_SITE_OTHER): Payer: Medicare Other

## 2012-08-05 ENCOUNTER — Encounter: Payer: Self-pay | Admitting: Gastroenterology

## 2012-08-05 ENCOUNTER — Ambulatory Visit (INDEPENDENT_AMBULATORY_CARE_PROVIDER_SITE_OTHER): Payer: Medicare Other | Admitting: Gastroenterology

## 2012-08-05 VITALS — BP 90/58 | HR 68 | Ht 63.0 in | Wt 134.2 lb

## 2012-08-05 DIAGNOSIS — K759 Inflammatory liver disease, unspecified: Secondary | ICD-10-CM

## 2012-08-05 DIAGNOSIS — R945 Abnormal results of liver function studies: Secondary | ICD-10-CM

## 2012-08-05 LAB — PROTIME-INR
INR: 1.1 ratio — ABNORMAL HIGH (ref 0.8–1.0)
Prothrombin Time: 11.2 s (ref 10.2–12.4)

## 2012-08-05 NOTE — Patient Instructions (Addendum)
We will refer you to the Hepatitis clinic You need to go to the basement for labs today

## 2012-08-05 NOTE — Progress Notes (Signed)
History of Present Illness: 78 year-old [place] male referred at the request of Dr. Drue Novel for evaluation of abnormal liver tests. These were incidentally noted. Serologies are positive for hepatitis B surface antigen. There is no history of hepatitis or jaundice. Upper endoscopy in 2009 demonstrated a distal esophageal stricture which was dilated colonoscopy in 2009 demonstrated diverticulosis. Ultrasound in May, 2014 was normal.    Past Medical History  Diagnosis Date  . GERD (gastroesophageal reflux disease)     EGD 5-09: : Duodenitis without Hemorrhage, Esophageal Stricture.  . Hyperlipidemia   . Elevated LFTs   . PPD positive     Was Rx INH x 3 months (2008)--HD     Past Surgical History  Procedure Laterality Date  . No past surgeries     family history includes Heart disease in his father. Current Outpatient Prescriptions  Medication Sig Dispense Refill  . esomeprazole (NEXIUM) 40 MG packet Take 40 mg by mouth daily before breakfast.  30 each  6  . Multiple Vitamin (MULTIVITAMIN) tablet Take 1 tablet by mouth daily.      . NON FORMULARY 1 capsule daily. Golden cordyceps extract 800mg       . zoster vaccine live, PF, (ZOSTAVAX) 21308 UNT/0.65ML injection Inject 19,400 Units into the skin once.  1 each  0   No current facility-administered medications for this visit.   Allergies as of 08/05/2012  . (No Known Allergies)    reports that he has quit smoking. He has never used smokeless tobacco. He reports that  drinks alcohol. He reports that he does not use illicit drugs.     Review of Systems: He complains of intermittent left flank discomfort Pertinent positive and negative review of systems were noted in the above HPI section. All other review of systems were otherwise negative.  Vital signs were reviewed in today's medical record Physical Exam: General: Well developed , well nourished, no acute distress Skin: anicteric Head: Normocephalic and atraumatic Eyes:  sclerae  anicteric, EOMI Ears: Normal auditory acuity Mouth: No deformity or lesions Neck: Supple, no masses or thyromegaly Lungs: Clear throughout to auscultation Heart: Regular rate and rhythm; no murmurs, rubs or bruits Abdomen: Soft, non tender and non distended. No masses, hepatosplenomegaly or hernias noted. Normal Bowel sounds Rectal:deferred Musculoskeletal: Symmetrical with no gross deformities  Skin: No lesions on visible extremities Pulses:  Normal pulses noted Extremities: No clubbing, cyanosis, edema or deformities noted Neurological: Alert oriented x 4, grossly nonfocal Cervical Nodes:  No significant cervical adenopathy Inguinal Nodes: No significant inguinal adenopathy Psychological:  Alert and cooperative. Normal mood and affect

## 2012-08-05 NOTE — Assessment & Plan Note (Signed)
Abnormal liver tests are likely to 2 hepatitis B. 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  Recommendations #1 HBV quantitative DNA, serologies for hepatitis A. and C., alpha-fetoprotein, INR #2 refer to hepatology clinic for consideration of therapy

## 2012-08-06 LAB — HEPATITIS A ANTIBODY, IGM: Hep A IgM: NEGATIVE

## 2012-11-09 ENCOUNTER — Ambulatory Visit: Payer: Medicare Other | Admitting: Internal Medicine

## 2012-12-24 ENCOUNTER — Ambulatory Visit: Payer: Medicare Other | Admitting: Internal Medicine

## 2013-01-19 ENCOUNTER — Encounter: Payer: Self-pay | Admitting: Internal Medicine

## 2013-02-23 ENCOUNTER — Ambulatory Visit: Payer: Medicare Other | Admitting: Internal Medicine

## 2013-03-05 ENCOUNTER — Encounter: Payer: Self-pay | Admitting: Internal Medicine

## 2013-03-05 ENCOUNTER — Ambulatory Visit (INDEPENDENT_AMBULATORY_CARE_PROVIDER_SITE_OTHER): Payer: Medicare Other | Admitting: Internal Medicine

## 2013-03-05 VITALS — BP 94/66 | HR 101 | Temp 97.9°F | Wt 135.0 lb

## 2013-03-05 DIAGNOSIS — K219 Gastro-esophageal reflux disease without esophagitis: Secondary | ICD-10-CM

## 2013-03-05 DIAGNOSIS — R945 Abnormal results of liver function studies: Secondary | ICD-10-CM

## 2013-03-05 DIAGNOSIS — B1910 Unspecified viral hepatitis B without hepatic coma: Secondary | ICD-10-CM

## 2013-03-05 DIAGNOSIS — R42 Dizziness and giddiness: Secondary | ICD-10-CM

## 2013-03-05 DIAGNOSIS — Z23 Encounter for immunization: Secondary | ICD-10-CM

## 2013-03-05 LAB — CBC WITH DIFFERENTIAL/PLATELET
BASOS ABS: 0 10*3/uL (ref 0.0–0.1)
Basophils Relative: 0.5 % (ref 0.0–3.0)
Eosinophils Absolute: 0.2 10*3/uL (ref 0.0–0.7)
Eosinophils Relative: 2.4 % (ref 0.0–5.0)
HEMATOCRIT: 46.5 % (ref 39.0–52.0)
HEMOGLOBIN: 15.8 g/dL (ref 13.0–17.0)
LYMPHS ABS: 4.4 10*3/uL — AB (ref 0.7–4.0)
Lymphocytes Relative: 48.5 % — ABNORMAL HIGH (ref 12.0–46.0)
MCHC: 34 g/dL (ref 30.0–36.0)
MCV: 77.6 fl — AB (ref 78.0–100.0)
MONOS PCT: 6.5 % (ref 3.0–12.0)
Monocytes Absolute: 0.6 10*3/uL (ref 0.1–1.0)
NEUTROS ABS: 3.8 10*3/uL (ref 1.4–7.7)
Neutrophils Relative %: 42.1 % — ABNORMAL LOW (ref 43.0–77.0)
Platelets: 122 10*3/uL — ABNORMAL LOW (ref 150.0–400.0)
RBC: 5.99 Mil/uL — ABNORMAL HIGH (ref 4.22–5.81)
RDW: 13.4 % (ref 11.5–14.6)
WBC: 9 10*3/uL (ref 4.5–10.5)

## 2013-03-05 NOTE — Progress Notes (Signed)
Pre visit review using our clinic review tool, if applicable. No additional management support is needed unless otherwise documented below in the visit note. 

## 2013-03-05 NOTE — Assessment & Plan Note (Signed)
Status post GI eval, he was dx w/ have hepatitis B and referred to a specialty clinic apparently he has not gone. Ultrasound few months ago show no liver abnormalities. Plan: Monitor LFTs and CBC, re referred to specialty clinic. Implications of chronic hepatitis B including cancer discussed with the patient

## 2013-03-05 NOTE — Assessment & Plan Note (Signed)
Dizziness as described as the patient likely benign positional vertigo, recommend observation

## 2013-03-05 NOTE — Patient Instructions (Signed)
Get your blood work before you leave   Next visit is for a physical exam in 6 months , fasting Please make an appointment    

## 2013-03-05 NOTE — Assessment & Plan Note (Signed)
Rarely has symptoms, nexium  very expensive, recommend Prilosec OTC

## 2013-03-05 NOTE — Progress Notes (Signed)
   Subjective:    Patient ID: Kevin Hutchinson, male    DOB: 1934/05/21, 79 y.o.   MRN: [id number]  HPI Routine office visit, here with a translator. Increased LFTs--note from GI review, see assessment and plan. Twice in the last 6 months he has experienced  dizziness when he laid down in bed and turned her head to the left, described as spinning, symptoms self resolved quickly, there was no associated nausea or headache.  Past Medical History  Diagnosis Date  . GERD (gastroesophageal reflux disease)     EGD 5-09: : Duodenitis without Hemorrhage, Esophageal Stricture.  . Hyperlipidemia   . Hepatitis B   . PPD positive     Was Rx INH x 3 months (2008)--HD     / Past Surgical History  Procedure Laterality Date  . No past surgeries       Review of Systems GERD symptoms are still on and off but mild , has been unable to take Nexium due to cost Denies any nausea, vomiting, diarrhea. Appetite is normal. The only medication he is currently taking is multivitamins    Objective:   Physical Exam BP 94/66  Pulse 101  Temp(Src) 97.9 F (36.6 C)  Wt 135 lb (61.236 kg)  SpO2 97% General -- alert, well-developed, NAD.  Lungs -- normal respiratory effort, no intercostal retractions, no accessory muscle use, and normal breath sounds.  Heart-- normal rate, regular rhythm, no murmur.  Abdomen-- Not distended, good bowel sounds,soft, non-tender. No rebound or rigidity. No mass,organomegaly.  Extremities-- no pretibial edema bilaterally  Psych--   No anxious or depressed appearing.     Assessment & Plan:

## 2013-03-07 ENCOUNTER — Encounter: Payer: Self-pay | Admitting: Internal Medicine

## 2013-03-08 ENCOUNTER — Telehealth: Payer: Self-pay | Admitting: Gastroenterology

## 2013-03-08 ENCOUNTER — Other Ambulatory Visit: Payer: Self-pay | Admitting: *Deleted

## 2013-03-08 NOTE — Telephone Encounter (Signed)
[facility]  They said that they thought he has already been seen there but was in a paper chart.   I faxed over new information to [phone]   They are trying to work him in ASAP   She will contact me and patient with the appointment

## 2013-03-08 NOTE — Telephone Encounter (Signed)
Spoke with Brittney from Dr Leta JunglingPaz's office

## 2013-03-08 NOTE — Telephone Encounter (Signed)
Pt was seen for an OV with Dr. Arlyce DiceKaplan 08/05/12. Per office note pt was supposed to be referred to Hep Clinic. Zella BallRobin do you know anything about this?

## 2013-03-12 ENCOUNTER — Other Ambulatory Visit (INDEPENDENT_AMBULATORY_CARE_PROVIDER_SITE_OTHER): Payer: Medicare Other

## 2013-03-12 DIAGNOSIS — R945 Abnormal results of liver function studies: Secondary | ICD-10-CM

## 2013-03-12 LAB — HEPATIC FUNCTION PANEL
ALBUMIN: 4.5 g/dL (ref 3.5–5.2)
ALT: 22 U/L (ref 0–53)
AST: 25 U/L (ref 0–37)
Alkaline Phosphatase: 70 U/L (ref 39–117)
Bilirubin, Direct: 0.1 mg/dL (ref 0.0–0.3)
Indirect Bilirubin: 0.5 mg/dL (ref 0.0–0.9)
TOTAL PROTEIN: 7.7 g/dL (ref 6.0–8.3)
Total Bilirubin: 0.6 mg/dL (ref 0.3–1.2)

## 2013-03-15 ENCOUNTER — Encounter: Payer: Self-pay | Admitting: *Deleted

## 2013-03-23 NOTE — Telephone Encounter (Signed)
[facility] Message [facility] to contact me

## 2013-04-02 NOTE — Telephone Encounter (Signed)
[facility]  They are sending the patient his appointment time and will fax it to us after they get with the patient

## 2013-06-25 ENCOUNTER — Telehealth: Payer: Self-pay

## 2013-06-25 NOTE — Telephone Encounter (Signed)
Left message for call back  identifiable  Flu vaccine --02/2013 Tdap--2011 PNA--2009 Shingles vaccine--2009 [facility]--Dr Kaplan--06/2007--Tics PSA-2012--3.58

## 2013-06-28 ENCOUNTER — Encounter: Payer: Self-pay | Admitting: Internal Medicine

## 2013-06-28 ENCOUNTER — Ambulatory Visit (INDEPENDENT_AMBULATORY_CARE_PROVIDER_SITE_OTHER): Payer: Medicare Other | Admitting: Internal Medicine

## 2013-06-28 VITALS — BP 105/62 | HR 84 | Temp 98.3°F | Ht 61.5 in | Wt 136.0 lb

## 2013-06-28 DIAGNOSIS — Z0000 Encounter for general adult medical examination without abnormal findings: Secondary | ICD-10-CM

## 2013-06-28 DIAGNOSIS — Z23 Encounter for immunization: Secondary | ICD-10-CM

## 2013-06-28 DIAGNOSIS — M25519 Pain in unspecified shoulder: Secondary | ICD-10-CM | POA: Insufficient documentation

## 2013-06-28 DIAGNOSIS — B1910 Unspecified viral hepatitis B without hepatic coma: Secondary | ICD-10-CM

## 2013-06-28 DIAGNOSIS — E785 Hyperlipidemia, unspecified: Secondary | ICD-10-CM

## 2013-06-28 DIAGNOSIS — N400 Enlarged prostate without lower urinary tract symptoms: Secondary | ICD-10-CM

## 2013-06-28 LAB — COMPREHENSIVE METABOLIC PANEL
ALK PHOS: 73 U/L (ref 39–117)
ALT: 72 U/L — AB (ref 0–53)
AST: 52 U/L — AB (ref 0–37)
Albumin: 4 g/dL (ref 3.5–5.2)
BILIRUBIN TOTAL: 0.3 mg/dL (ref 0.2–1.2)
BUN: 12 mg/dL (ref 6–23)
CO2: 27 mEq/L (ref 19–32)
CREATININE: 0.9 mg/dL (ref 0.4–1.5)
Calcium: 9.4 mg/dL (ref 8.4–10.5)
Chloride: 104 mEq/L (ref 96–112)
GFR: 91.68 mL/min (ref >60.00)
Glucose, Bld: 89 mg/dL (ref 70–99)
Potassium: 4.2 mEq/L (ref 3.5–5.1)
Sodium: 139 mEq/L (ref 135–145)
TOTAL PROTEIN: 7.1 g/dL (ref 6.0–8.3)

## 2013-06-28 LAB — TSH: TSH: 0.52 u[IU]/mL (ref 0.35–5.50)

## 2013-06-28 LAB — PSA: PSA: 4.19 ng/mL — ABNORMAL HIGH (ref 0.10–4.00)

## 2013-06-28 MED ORDER — TAMSULOSIN HCL 0.4 MG PO CAPS: 0.4000 mg | ORAL_CAPSULE | Freq: Every day | ORAL | Status: DC

## 2013-06-28 NOTE — Progress Notes (Signed)
Subjective:    Patient ID: Kevin Hutchinson, male    DOB: Jul 22, 1934, 79 y.o.   MRN: [id number]  DOS:  06/28/2013 Type of  visit: Here w/ a translator Here for Medicare AWV: 1. Risk factors based on Past M, S, F history: reviewed 2. Physical Activities:  Remains active 3. Depression/mood:  (-) screening 4. Hearing: reports L hearing decreased, saw a specialist, declined a hearing aid ($)    5. ADL's:  Independent, drives   6. Fall Risk: no recent falls, see instructions (asked translator to discuss w/ him) 7. home Safety: does feel safe at home   8. Height, weight, &visual acuity: see VS, uses glasses   9. Counseling: provided 10. Labs ordered based on risk factors: if needed   11. Referral Coordination: if needed 12.  Care Plan, see assessment and plan   13.   Cognitive Assessment: seems to be doing well emotionally, normal motor abilities   In addition, today we discussed the following: History of a hepatitis B, chart review,see assessment and plan Also complains of left shoulder pain for 1 month, mostly at night, some radiation to the left lateral neck, occasionally the left hand gets numb. Reports difficulty urinating sometimes, does not feel like he completely empties his bladder. No dysuria gross hematuria.  ROS Denies lower extremity edema, palpitations. No nausea, vomiting, diarrhea or blood in the stools.    Past Medical History  Diagnosis Date  . GERD (gastroesophageal reflux disease)     EGD 5-09: : Duodenitis without Hemorrhage, Esophageal Stricture.  . Hyperlipidemia   . Hepatitis B   . PPD positive     Was Rx INH x 3 months (2008)--HD      Past Surgical History  Procedure Laterality Date  . No past surgeries      History   Social History  . Marital Status: Married    Spouse Name: N/A    Number of Children: N/A  . Years of Education: N/A   Occupational History  . Not on file.   Social History Main Topics  . Smoking status: Former Games developermoker  .  Smokeless tobacco: Never Used  . Alcohol Use: Yes     Comment: RARE  . Drug Use: No  . Sexual Activity: Not on file   Other Topics Concern  . Not on file   Social History Narrative   LOW FAT DIET   ACTIVE   FROM [place]    Family History:   F - ?MI- deceased   33M - deceased - old age   colon ca-- no   prostate ca--no     Medication List       This list is accurate as of: 06/28/13 10:03 AM.  Always use your most recent med list.               multivitamin capsule  Take 1 capsule by mouth daily.     omeprazole 20 MG capsule  Commonly known as:  PRILOSEC  Take 20 mg by mouth daily.     TYLENOL PO  Take by mouth.           Objective:   Physical Exam BP 105/62  Pulse 84  Temp(Src) 98.3 F (36.8 C)  Ht 5' 1.5" (1.562 m)  Wt 136 lb (61.689 kg)  BMI 25.28 kg/m2  SpO2 97% General -- alert, well-developed, NAD.  Neck --no thyromegaly , normal carotid pulse, no bruit. Not tender to palpation of the cervical spine, range of  motion normal HEENT-- Not pale.  Lungs -- normal respiratory effort, no intercostal retractions, no accessory muscle use, and normal breath sounds.  Heart-- normal rate, regular rhythm, no murmur.  Abdomen-- Not distended, good bowel sounds,soft, non-tender. DRE-- process normal in size, right-sided nodule ~ 0.8 cm  versus prostate lobe Extremities-- no pretibial edema bilaterally  Neurologic--  alert & oriented X3. Speech normal, gait normal, strength normal in all extremities.  DTRs symmetrically decreased   Psych--  No anxious or depressed appearing.   Assessment & Plan:

## 2013-06-28 NOTE — Patient Instructions (Signed)
Get your blood work before you leave   We are referring you to the prostate doctor ---->  urology We are referring you to the liver doctor  Start taking Flomax, a medication to help you with your bladder.call if side effects   Next visit is for routine check up in 3 months, fasting      Fall Prevention and Home Safety Falls cause injuries and can affect all age groups. It is possible to use preventive measures to significantly decrease the likelihood of falls. There are many simple measures which can make your home safer and prevent falls. OUTDOORS  Repair cracks and edges of walkways and driveways.  Remove high doorway thresholds.  Trim shrubbery on the main path into your home.  Have good outside lighting.  Clear walkways of tools, rocks, debris, and clutter.  Check that handrails are not broken and are securely fastened. Both sides of steps should have handrails.  Have leaves, snow, and ice cleared regularly.  Use sand or salt on walkways during winter months.  In the garage, clean up grease or oil spills. BATHROOM  Install night lights.  Install grab bars by the toilet and in the tub and shower.  Use non-skid mats or decals in the tub or shower.  Place a plastic non-slip stool in the shower to sit on, if needed.  Keep floors dry and clean up all water on the floor immediately.  Remove soap buildup in the tub or shower on a regular basis.  Secure bath mats with non-slip, double-sided rug tape.  Remove throw rugs and tripping hazards from the floors. BEDROOMS  Install night lights.  Make sure a bedside light is easy to reach.  Do not use oversized bedding.  Keep a telephone by your bedside.  Have a firm chair with side arms to use for getting dressed.  Remove throw rugs and tripping hazards from the floor. KITCHEN  Keep handles on pots and pans turned toward the center of the stove. Use back burners when possible.  Clean up spills quickly and  allow time for drying.  Avoid walking on wet floors.  Avoid hot utensils and knives.  Position shelves so they are not too high or low.  Place commonly used objects within easy reach.  If necessary, use a sturdy step stool with a grab bar when reaching.  Keep electrical cables out of the way.  Do not use floor polish or wax that makes floors slippery. If you must use wax, use non-skid floor wax.  Remove throw rugs and tripping hazards from the floor. STAIRWAYS  Never leave objects on stairs.  Place handrails on both sides of stairways and use them. Fix any loose handrails. Make sure handrails on both sides of the stairways are as long as the stairs.  Check carpeting to make sure it is firmly attached along stairs. Make repairs to worn or loose carpet promptly.  Avoid placing throw rugs at the top or bottom of stairways, or properly secure the rug with carpet tape to prevent slippage. Get rid of throw rugs, if possible.  Have an electrician put in a light switch at the top and bottom of the stairs. OTHER FALL PREVENTION TIPS  Wear low-heel or rubber-soled shoes that are supportive and fit well. Wear closed toe shoes.  When using a stepladder, make sure it is fully opened and both spreaders are firmly locked. Do not climb a closed stepladder.  Add color or contrast paint or tape to grab bars  and handrails in your home. Place contrasting color strips on first and last steps.  Learn and use mobility aids as needed. Install an electrical emergency response system.  Turn on lights to avoid dark areas. Replace light bulbs that burn out immediately. Get light switches that glow.  Arrange furniture to create clear pathways. Keep furniture in the same place.  Firmly attach carpet with non-skid or double-sided tape.  Eliminate uneven floor surfaces.  Select a carpet pattern that does not visually hide the edge of steps.  Be aware of all pets. OTHER HOME SAFETY TIPS  Set the  water temperature for 120 F (48.8 C).  Keep emergency numbers on or near the telephone.  Keep smoke detectors on every level of the home and near sleeping areas. Document Released: 02/01/2002 Document Revised: 08/13/2011 Document Reviewed: 05/03/2011 [facility] Patient Information 2014 [employer], [place].

## 2013-06-28 NOTE — Assessment & Plan Note (Signed)
Shoulder pain, we'll recommend observation for now, needs to work on  hepatitis and urology referral.

## 2013-06-28 NOTE — Assessment & Plan Note (Signed)
Not fasting today.  ?

## 2013-06-28 NOTE — Assessment & Plan Note (Signed)
Status post GI eval, diagnosed with chronic active hepatitis B, was referred to the hepatitis  clinic but did not go ----> reason? No clear to me. Plan: Re-refer.

## 2013-06-28 NOTE — Assessment & Plan Note (Signed)
Symptoms are more prominent this year, he was offered  medication and agrees that would like to try. Additionally, question of a nodule versus normal prostatic lobe on the right. Plan: Flomax, urology referral

## 2013-06-28 NOTE — Assessment & Plan Note (Addendum)
Td--2011 pneumonia shot--2009 prevnar zostavax   Rx was provided before C[facility] 5-09: [facility],  Next per GI psa-- see BPH Diet-exercise discussed

## 2013-06-28 NOTE — Telephone Encounter (Signed)
Unable to reach prior to visit  

## 2013-07-07 ENCOUNTER — Telehealth: Payer: Self-pay | Admitting: *Deleted

## 2013-07-14 ENCOUNTER — Ambulatory Visit (INDEPENDENT_AMBULATORY_CARE_PROVIDER_SITE_OTHER): Payer: Medicare Other | Admitting: Internal Medicine

## 2013-07-14 ENCOUNTER — Encounter: Payer: Self-pay | Admitting: Internal Medicine

## 2013-07-14 VITALS — BP 119/65 | HR 64 | Wt 131.0 lb

## 2013-07-14 DIAGNOSIS — B181 Chronic viral hepatitis B without delta-agent: Secondary | ICD-10-CM

## 2013-07-14 LAB — COMPLETE METABOLIC PANEL WITH GFR
ALT: 112 U/L — AB (ref 0–53)
AST: 84 U/L — AB (ref 0–37)
Albumin: 4.4 g/dL (ref 3.5–5.2)
Alkaline Phosphatase: 73 U/L (ref 39–117)
BILIRUBIN TOTAL: 0.4 mg/dL (ref 0.2–1.2)
BUN: 13 mg/dL (ref 6–23)
CO2: 27 mEq/L (ref 19–32)
CREATININE: 0.98 mg/dL (ref 0.50–1.35)
Calcium: 9.4 mg/dL (ref 8.4–10.5)
Chloride: 101 mEq/L (ref 96–112)
GFR, Est African American: 86 mL/min
GFR, Est Non African American: 75 mL/min
Glucose, Bld: 114 mg/dL — ABNORMAL HIGH (ref 70–99)
Potassium: 4.9 mEq/L (ref 3.5–5.3)
Sodium: 137 mEq/L (ref 135–145)
Total Protein: 7.5 g/dL (ref 6.0–8.3)

## 2013-07-14 LAB — CBC
HCT: 43.6 % (ref 39.0–52.0)
HEMOGLOBIN: 15.4 g/dL (ref 13.0–17.0)
MCH: 26.1 pg (ref 26.0–34.0)
MCHC: 35.3 g/dL (ref 30.0–36.0)
MCV: 74 fL — AB (ref 78.0–100.0)
Platelets: 111 10*3/uL — ABNORMAL LOW (ref 150–400)
RBC: 5.89 MIL/uL — AB (ref 4.22–5.81)
RDW: 14.1 % (ref 11.5–15.5)
WBC: 7 10*3/uL (ref 4.0–10.5)

## 2013-07-14 LAB — PROTIME-INR
INR: 1.04 (ref <1.50)
Prothrombin Time: 13.5 seconds (ref 11.6–15.2)

## 2013-07-14 NOTE — Progress Notes (Signed)
Subjective:    Patient ID: Geryl CouncilmanSon T Skare, male    DOB: December 03, 1934, 79 y.o.   MRN: [id number]  HPI [age]yo [place] male who has chronic hepatitis B. He is referred to our clinic by primary care doctor for management of chronic hep B. He is here with translator. In his chart, it appears that hep B noted back in 2009. He is known to have chronic HBV( HepBSAg+, HepBCore Ab+, Hep B S Ab-) nonimmune for hep A. He hadNever been on treatment. Never had any episodes of jaundice, hemoptysis, ascites, SBP or easing bruising. He denies any recent drinking or smoking. He had RUQ U/S last year that did not show any evidence of cirrhosis.  He is retired, often engages in Nature conservation officerprayer/worship. No smoking or heavy alcohol use.   He has several questions regarding cost of medication and how long he would be taking it.  No Known Allergies   Current Outpatient Prescriptions on File Prior to Visit  Medication Sig Dispense Refill  . Acetaminophen (TYLENOL PO) Take by mouth.      . tamsulosin (FLOMAX) 0.4 MG CAPS capsule Take 1 capsule (0.4 mg total) by mouth daily.  30 capsule  3   No current facility-administered medications on file prior to visit.   Active Ambulatory Problems    Diagnosis Date Noted  . HYPERLIPIDEMIA 06/15/2007  . GERD 03/12/2007  . BACK PAIN 02/16/2007  . DIZZINESS 05/16/2009  . Hepatitis B 06/15/2007  . 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 Annual physical exam 01/01/2011  . BPH (benign prostatic hyperplasia) 01/01/2011  . Pain in joint, shoulder region 06/28/2013   Resolved Ambulatory Problems    Diagnosis Date Noted  . No Resolved Ambulatory Problems   Past Medical History  Diagnosis Date  . GERD (gastroesophageal reflux disease)   . Hyperlipidemia    History  Substance Use Topics  . Smoking status: Former Games developermoker  . Smokeless tobacco: Never Used     Comment: quit in the 90s  . Alcohol Use: Yes     Comment: RARE  family history is not on file.   Review of Systems Review of  Systems  Constitutional: Negative for fever, chills, diaphoresis, activity change, appetite change, fatigue and unexpected weight change.  HENT: Negative for congestion, sore throat, rhinorrhea, sneezing, trouble swallowing and sinus pressure.  Eyes: Negative for photophobia and visual disturbance.  Respiratory: Negative for cough, chest tightness, shortness of breath, wheezing and stridor.  Cardiovascular: Negative for chest pain, palpitations and leg swelling.  Gastrointestinal: Negative for nausea, vomiting, abdominal pain, diarrhea, constipation, blood in stool, abdominal distention and anal bleeding.  Genitourinary: Negative for dysuria, hematuria, flank pain and difficulty urinating.  Musculoskeletal: Negative for myalgias, back pain, joint swelling, arthralgias and gait problem.  Skin: Negative for color change, pallor, rash and wound.  Neurological: Negative for dizziness, tremors, weakness and light-headedness.  Hematological: Negative for adenopathy. Does not bruise/bleed easily.  Psychiatric/Behavioral: Negative for behavioral problems, confusion, sleep disturbance, dysphoric mood, decreased concentration and agitation.       Objective:   Physical Exam BP 119/65  Pulse 64  Wt 131 lb (59.421 kg) Physical Exam  Constitutional: He is oriented to person, place, and time. He appears well-developed and well-nourished. No distress.  HENT:  Mouth/Throat: Oropharynx is clear and moist. No oropharyngeal exudate.  Cardiovascular: Normal rate, regular rhythm and normal heart sounds. Exam reveals no gallop and no friction rub.  No murmur heard.  Pulmonary/Chest: Effort normal and breath sounds normal. No respiratory distress. He  has no wheezes.  Abdominal: Soft. Bowel sounds are normal. He exhibits no distension. There is no tenderness.  Lymphadenopathy:  He has no cervical adenopathy.  Neurological: He is alert and oriented to person, place, and time.  Skin: Skin is warm and dry. No  rash noted. No erythema.  Psychiatric: He has a normal mood and affect. His behavior is normal.        Assessment & Plan:  Chronic hepatitis b = will repeat viral load, hep B e ag, hep B e ab and HIV. We discussed getting started on truvada. Await lab results and give rx once they return. If co-pay is too expensive, we will apply for patient asisstance.  rtc in 4-6 wk

## 2013-07-15 LAB — HIV ANTIBODY (ROUTINE TESTING W REFLEX): HIV 1&2 Ab, 4th Generation: NONREACTIVE

## 2013-07-16 LAB — HEPATITIS B DNA, ULTRAQUANTITATIVE, PCR
HEPATITIS B DNA (CALC): 1818686 {copies}/mL — AB (ref <116)
HEPATITIS B DNA: 312489 [IU]/mL — AB (ref <20)

## 2013-07-16 LAB — HEPATITIS B E ANTIBODY: HEPATITIS BE ANTIBODY: REACTIVE — AB

## 2013-07-16 LAB — HEPATITIS B E ANTIGEN: HEPATITIS BE ANTIGEN: NONREACTIVE

## 2013-07-22 ENCOUNTER — Telehealth: Payer: Self-pay | Admitting: *Deleted

## 2013-07-22 NOTE — Telephone Encounter (Signed)
LETTER RECEIVED REVIEWED BY DR Arlyce Dice, PATIENT HAS NO SHOWED BOTH APPOINTMENTS TO [facility] WHICH WE REFERRED HIM TO

## 2013-07-23 ENCOUNTER — Telehealth: Payer: Self-pay | Admitting: *Deleted

## 2013-07-23 ENCOUNTER — Other Ambulatory Visit: Payer: Self-pay | Admitting: Internal Medicine

## 2013-07-23 DIAGNOSIS — B180 Chronic viral hepatitis B with delta-agent: Secondary | ICD-10-CM

## 2013-07-23 MED ORDER — TENOFOVIR DISOPROXIL FUMARATE 300 MG PO TABS: 300.0000 mg | ORAL_TABLET | Freq: Every day | ORAL | Status: DC

## 2013-07-23 NOTE — Telephone Encounter (Signed)
Patient's daughter Candise Bowens called [phone], he can not afford the viread, it is $300. Wanted to let Dr. Drue Second know; advised would call her back on Monday, there may be patient assistance available. Randolm Idol will have to check on this. Wendall Mola

## 2013-07-23 NOTE — Telephone Encounter (Signed)
Patient's daughter, Candise Bowens called to find out what medication patient was prescribed and where to pick up. She said he did not understand anything that was discussed through the interpreter. Dr. Drue Second please review his labs and call daughter at [phone]. Patient has [employer]

## 2013-07-23 NOTE — Progress Notes (Signed)
Spoke to Kevin Hutchinson daughter to let her know that we will start treatment on chronic hep b. He will start on viread 300mg  daily.

## 2013-07-26 NOTE — Telephone Encounter (Signed)
Pam thinks there is patient assistance for this medication and will call the daughter.

## 2013-07-26 NOTE — Telephone Encounter (Signed)
Can you let pam call the daughter to see how we can apply for patient assistance so that viread is covered for them. thx

## 2013-08-09 NOTE — Telephone Encounter (Signed)
error 

## 2013-08-25 ENCOUNTER — Ambulatory Visit: Payer: Medicare Other | Admitting: Internal Medicine

## 2013-09-08 ENCOUNTER — Encounter: Payer: Self-pay | Admitting: Internal Medicine

## 2013-09-08 ENCOUNTER — Ambulatory Visit (INDEPENDENT_AMBULATORY_CARE_PROVIDER_SITE_OTHER): Payer: Medicare Other | Admitting: Internal Medicine

## 2013-09-08 VITALS — BP 122/78 | HR 72 | Temp 97.5°F | Wt 134.0 lb

## 2013-09-08 DIAGNOSIS — B181 Chronic viral hepatitis B without delta-agent: Secondary | ICD-10-CM

## 2013-09-08 DIAGNOSIS — B180 Chronic viral hepatitis B with delta-agent: Secondary | ICD-10-CM

## 2013-09-08 LAB — BASIC METABOLIC PANEL WITH GFR
BUN: 12 mg/dL (ref 6–23)
CO2: 29 mEq/L (ref 19–32)
Calcium: 9.4 mg/dL (ref 8.4–10.5)
Chloride: 100 mEq/L (ref 96–112)
Creat: 0.99 mg/dL (ref 0.50–1.35)
GFR, EST AFRICAN AMERICAN: 85 mL/min
GFR, Est Non African American: 73 mL/min
GLUCOSE: 128 mg/dL — AB (ref 70–99)
Potassium: 5 mEq/L (ref 3.5–5.3)
Sodium: 137 mEq/L (ref 135–145)

## 2013-09-08 MED ORDER — TENOFOVIR DISOPROXIL FUMARATE 300 MG PO TABS: 300.0000 mg | ORAL_TABLET | Freq: Every day | ORAL | Status: DC

## 2013-09-08 NOTE — Progress Notes (Signed)
   Subjective:    Patient ID: Kevin Hutchinson, male    DOB: March 28, 1934, 79 y.o.   MRN: [id number]  HPI 79yo M with  Chronic hep b, he was unable to purchase meds still too cost prohibitive. He is here with his wife and interpreter. In good state of health. Distraught that he is unable to afford medicaitons.   During this visit, he met with financial counselor. He is unable to get any additional assistance that we have access to.  Current Outpatient Prescriptions on File Prior to Visit  Medication Sig Dispense Refill  . multivitamin-iron-minerals-folic acid (CENTRUM) chewable tablet Chew 1 tablet by mouth daily.      . Acetaminophen (TYLENOL PO) Take by mouth.      . tamsulosin (FLOMAX) 0.4 MG CAPS capsule Take 1 capsule (0.4 mg total) by mouth daily.  30 capsule  3   No current facility-administered medications on file prior to visit.   Active Ambulatory Problems    Diagnosis Date Noted  . HYPERLIPIDEMIA 06/15/2007  . GERD 03/12/2007  . BACK PAIN 02/16/2007  . DIZZINESS 05/16/2009  . Hepatitis B 06/15/2007  . 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 Annual physical exam 01/01/2011  . BPH (benign prostatic hyperplasia) 01/01/2011  . Pain in joint, shoulder region 06/28/2013   Resolved Ambulatory Problems    Diagnosis Date Noted  . No Resolved Ambulatory Problems   Past Medical History  Diagnosis Date  . GERD (gastroesophageal reflux disease)   . Hyperlipidemia        Review of Systems 10 point ros is negative    Objective:   Physical Exam BP 122/78  Pulse 72  Temp(Src) 97.5 F (36.4 C) (Oral)  Wt 134 lb (60.782 kg) No exam at this time       Assessment & Plan:  Chronic hep b= attempting to get patient assistance but unable to do so. Will dispense tenofovir which is roughly 300 co pay per month for the patient.   rtc in 4 wks to get labs

## 2013-09-29 ENCOUNTER — Ambulatory Visit (INDEPENDENT_AMBULATORY_CARE_PROVIDER_SITE_OTHER): Payer: Medicare Other | Admitting: Internal Medicine

## 2013-09-29 ENCOUNTER — Encounter: Payer: Self-pay | Admitting: Internal Medicine

## 2013-09-29 VITALS — BP 133/74 | HR 74 | Temp 98.2°F | Wt 136.2 lb

## 2013-09-29 DIAGNOSIS — B181 Chronic viral hepatitis B without delta-agent: Secondary | ICD-10-CM

## 2013-09-29 DIAGNOSIS — N400 Enlarged prostate without lower urinary tract symptoms: Secondary | ICD-10-CM

## 2013-09-29 MED ORDER — TAMSULOSIN HCL 0.4 MG PO CAPS: 0.4000 mg | ORAL_CAPSULE | Freq: Every day | ORAL | Status: DC

## 2013-09-29 NOTE — Patient Instructions (Signed)
Continue with the same meds  Next visit, fasting, for a physical by May 2016

## 2013-09-29 NOTE — Assessment & Plan Note (Signed)
Was seen in the infectious disease clinic, having a hard time affording medications.

## 2013-09-29 NOTE — Progress Notes (Signed)
   Subjective:    Patient ID: Kevin Hutchinson, male    DOB: 1934-09-03, 79 y.o.   MRN: [id number]  DOS:  09/29/2013 Type of visit - description: f/u here w/a interpreter  History: BPH, increased PSA--- symptoms decrease with Flomax, did not see urology, see assessment and plan His to hepatitis B, saw ID, having a difficult time affording his medications.    ROS Denies fever or chills No nausea, vomiting, diarrhea No anxiety- depression No dysuria,  gross hematuria  Past Medical History  Diagnosis Date  . GERD (gastroesophageal reflux disease)     EGD 5-09: : Duodenitis without Hemorrhage, Esophageal Stricture.  . Hyperlipidemia   . Hepatitis B   . PPD positive     Was Rx INH x 3 months (2008)--HD      Past Surgical History  Procedure Laterality Date  . No past surgeries      History   Social History  . Marital Status: Married    Spouse Name: N/A    Number of Children: 5  . Years of Education: N/A   Occupational History  . Not on file.   Social History Main Topics  . Smoking status: Former Games developermoker  . Smokeless tobacco: Never Used     Comment: quit in the 90s  . Alcohol Use: Yes     Comment: RARE  . Drug Use: No  . Sexual Activity: Not on file   Other Topics Concern  . Not on file   Social History Narrative   Lives w/ wife   [place]        Medication List       This list is accurate as of: 09/29/13  6:10 PM.  Always use your most recent med list.               multivitamin-iron-minerals-folic acid chewable tablet  Chew 1 tablet by mouth daily.     tamsulosin 0.4 MG Caps capsule  Commonly known as:  FLOMAX  Take 0.4 mg by mouth daily after supper.     tamsulosin 0.4 MG Caps capsule  Commonly known as:  FLOMAX  Take 1 capsule (0.4 mg total) by mouth daily.     tenofovir 300 MG tablet  Commonly known as:  VIREAD  Take 1 tablet (300 mg total) by mouth daily.           Objective:   Physical Exam BP 133/74  Pulse 74  Temp(Src) 98.2 F  (36.8 C) (Oral)  Wt 136 lb 4 oz (61.803 kg)  SpO2 91% General -- alert, well-developed, NAD.  Lungs -- normal respiratory effort, no intercostal retractions, no accessory muscle use, and normal breath sounds.  Heart-- normal rate, regular rhythm, no murmur.   Neurologic--  Speech normal, gait appropriate for age, strength symmetric and appropriate for age.  Psych--  No anxious or depressed appearing.       Assessment & Plan:

## 2013-09-29 NOTE — Assessment & Plan Note (Signed)
Last PSA slightly elevated, symptoms decreased with Flomax. At this point he has not seen urology and declines to see them. Plan: Continue with flomax, recheck PSA yearly

## 2013-10-18 ENCOUNTER — Ambulatory Visit (INDEPENDENT_AMBULATORY_CARE_PROVIDER_SITE_OTHER): Payer: Medicare Other | Admitting: Internal Medicine

## 2013-10-18 ENCOUNTER — Encounter: Payer: Self-pay | Admitting: Internal Medicine

## 2013-10-18 VITALS — BP 122/70 | HR 67 | Wt 135.0 lb

## 2013-10-18 DIAGNOSIS — B180 Chronic viral hepatitis B with delta-agent: Secondary | ICD-10-CM

## 2013-10-18 MED ORDER — TENOFOVIR DISOPROXIL FUMARATE 300 MG PO TABS: 300.0000 mg | ORAL_TABLET | Freq: Every day | ORAL | Status: DC

## 2013-10-18 NOTE — Progress Notes (Signed)
   Subjective:    Patient ID: Kevin Hutchinson, male    DOB: 1934-04-07, 79 y.o.   MRN: [id number]  HPI 79yo vietnamese male known to have chronic HBV( HepBSAg+, HepBCore Ab+, Hep B S Ab-) nonimmune for hep A. He had never been on treatment. Never had any episodes of jaundice, hemoptysis, ascites, SBP or easing bruising. He denies any recent drinking or smoking. He had RUQ U/S last year that did not show any evidence of cirrhosis.  He was referred to our clinic for initiation of hepatitis B treatment. His insurance policy is limited, where his out of pocket costs are $300 per month for viread. He has purchased 2 months thus far.  Current Outpatient Prescriptions on File Prior to Visit  Medication Sig Dispense Refill  . multivitamin-iron-minerals-folic acid (CENTRUM) chewable tablet Chew 1 tablet by mouth daily.      . tamsulosin (FLOMAX) 0.4 MG CAPS capsule Take 1 capsule (0.4 mg total) by mouth daily.  30 capsule  10   No current facility-administered medications on file prior to visit.   Active Ambulatory Problems    Diagnosis Date Noted  . HYPERLIPIDEMIA 06/15/2007  . GERD 03/12/2007  . BACK PAIN 02/16/2007  . DIZZINESS 05/16/2009  . Hepatitis B 06/15/2007  . 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 Annual physical exam 01/01/2011  . BPH (benign prostatic hyperplasia) 01/01/2011  . Pain in joint, shoulder region 06/28/2013   Resolved Ambulatory Problems    Diagnosis Date Noted  . No Resolved Ambulatory Problems   Past Medical History  Diagnosis Date  . GERD (gastroesophageal reflux disease)   . Hyperlipidemia        Review of Systems     Objective:   Physical Exam BP 122/70  Pulse 67  Wt 135 lb (61.236 kg) Physical Exam  Constitutional: He is oriented to person, place, and time. He appears well-developed and well-nourished. No distress.  HENT:  Mouth/Throat: Oropharynx is clear and moist. No oropharyngeal exudate.  Abdominal: Soft. Bowel sounds are normal. He exhibits no  distension. There is no tenderness.  Lymphadenopathy:  He has no cervical adenopathy.  Neurological: He is alert and oriented to person, place, and time.  Skin: Skin is warm and dry. No rash noted. No erythema.  Psychiatric: He has a normal mood and affect. His behavior is normal.          Assessment & Plan:  Chronic hepatitis B = will continue with tenofovir  daily. Will check cmp and hep b viral load. Gave information to online pharmacies as possible alternative for cheaper meds.  rtc in 2 months

## 2013-10-19 LAB — COMPLETE METABOLIC PANEL WITH GFR
ALBUMIN: 4.3 g/dL (ref 3.5–5.2)
ALT: 28 U/L (ref 0–53)
AST: 31 U/L (ref 0–37)
Alkaline Phosphatase: 67 U/L (ref 39–117)
BILIRUBIN TOTAL: 0.6 mg/dL (ref 0.2–1.2)
BUN: 10 mg/dL (ref 6–23)
CALCIUM: 9.1 mg/dL (ref 8.4–10.5)
CO2: 29 mEq/L (ref 19–32)
Chloride: 105 mEq/L (ref 96–112)
Creat: 1 mg/dL (ref 0.50–1.35)
GFR, EST AFRICAN AMERICAN: 84 mL/min
GFR, EST NON AFRICAN AMERICAN: 72 mL/min
GLUCOSE: 93 mg/dL (ref 70–99)
POTASSIUM: 4.3 meq/L (ref 3.5–5.3)
Sodium: 140 mEq/L (ref 135–145)
TOTAL PROTEIN: 7.2 g/dL (ref 6.0–8.3)

## 2013-10-21 ENCOUNTER — Telehealth: Payer: Self-pay | Admitting: *Deleted

## 2013-10-21 DIAGNOSIS — B180 Chronic viral hepatitis B with delta-agent: Secondary | ICD-10-CM

## 2013-10-21 LAB — HEPATITIS B DNA, ULTRAQUANTITATIVE, PCR
Hepatitis B DNA (Calc): <116 copies/mL (ref <116)
Hepatitis B DNA: <20 IU/mL (ref <20)

## 2013-10-21 MED ORDER — TENOFOVIR DISOPROXIL FUMARATE 300 MG PO TABS: 300.0000 mg | ORAL_TABLET | Freq: Every day | ORAL | Status: DC

## 2013-10-21 NOTE — Telephone Encounter (Signed)
Needig hard copy of Viread rx to send to [facility].  Will print rx for MD signature and send to pharmacy after faxing to [phone].  Address:  [facility], [address] [place], [place]. [id number].  Original rx mailed to this address.

## 2013-11-24 ENCOUNTER — Encounter: Payer: Self-pay | Admitting: Gastroenterology

## 2013-12-16 ENCOUNTER — Ambulatory Visit (INDEPENDENT_AMBULATORY_CARE_PROVIDER_SITE_OTHER): Payer: Medicare Other | Admitting: Internal Medicine

## 2013-12-16 ENCOUNTER — Encounter: Payer: Self-pay | Admitting: Internal Medicine

## 2013-12-16 VITALS — BP 113/75 | HR 74 | Temp 97.2°F | Wt 133.0 lb

## 2013-12-16 DIAGNOSIS — K7460 Unspecified cirrhosis of liver: Secondary | ICD-10-CM

## 2013-12-16 DIAGNOSIS — R229 Localized swelling, mass and lump, unspecified: Secondary | ICD-10-CM

## 2013-12-16 DIAGNOSIS — B181 Chronic viral hepatitis B without delta-agent: Secondary | ICD-10-CM

## 2013-12-16 DIAGNOSIS — Z0000 Encounter for general adult medical examination without abnormal findings: Secondary | ICD-10-CM

## 2013-12-16 DIAGNOSIS — Z23 Encounter for immunization: Secondary | ICD-10-CM

## 2013-12-16 LAB — COMPLETE METABOLIC PANEL WITH GFR
ALT: 23 U/L (ref 0–53)
AST: 29 U/L (ref 0–37)
Albumin: 4.3 g/dL (ref 3.5–5.2)
Alkaline Phosphatase: 72 U/L (ref 39–117)
BILIRUBIN TOTAL: 0.6 mg/dL (ref 0.2–1.2)
BUN: 10 mg/dL (ref 6–23)
CO2: 29 mEq/L (ref 19–32)
CREATININE: 0.97 mg/dL (ref 0.50–1.35)
Calcium: 9.7 mg/dL (ref 8.4–10.5)
Chloride: 102 mEq/L (ref 96–112)
GFR, Est African American: 87 mL/min
GFR, Est Non African American: 75 mL/min
Glucose, Bld: 92 mg/dL (ref 70–99)
Potassium: 4.9 mEq/L (ref 3.5–5.3)
Sodium: 139 mEq/L (ref 135–145)
Total Protein: 7.3 g/dL (ref 6.0–8.3)

## 2013-12-16 NOTE — Progress Notes (Signed)
   Subjective:    Patient ID: Kevin Hutchinson, male    DOB: 07/22/1934, 79 y.o.   MRN: [id number]  HPI 79 yo M started on TDF for chronic hep B. Doing well with tenofovir for hte past 3 months. Recent labs shows VL now undetectable. No difficulty with adherence.  He has lump on left shoulder that causes intense pruritic feeling  [place] Systems     Objective:   Physical Exam BP 113/75  Pulse 74  Temp(Src) 97.2 F (36.2 C) (Oral)  Wt 133 lb (60.328 kg) Physical Exam  Constitutional: He is oriented to person, place, and time. He appears well-developed and well-nourished. No distress.  HEENT = no scleral icterus Back exam = small subcutaneous nodule 1 cm nontender between base of neck and left shoulder. No warmth, no erythema          Assessment & Plan:  Chronic hepatitis b = will get cmp, continue with TDF for addn 9 months to complete 12 months to decide to do target treatment vs. Continuous treatment  Subcutaneous nodule = apply hydrating lotion to see if it minimizes pruritic component. Appears benign at this time.  Health maintenance = will give flu shot

## 2014-03-17 ENCOUNTER — Ambulatory Visit (INDEPENDENT_AMBULATORY_CARE_PROVIDER_SITE_OTHER): Payer: Medicare Other | Admitting: Internal Medicine

## 2014-03-17 ENCOUNTER — Encounter: Payer: Self-pay | Admitting: Internal Medicine

## 2014-03-17 VITALS — BP 122/69 | HR 72 | Temp 97.6°F | Wt 127.0 lb

## 2014-03-17 DIAGNOSIS — B180 Chronic viral hepatitis B with delta-agent: Secondary | ICD-10-CM | POA: Diagnosis not present

## 2014-03-17 LAB — BASIC METABOLIC PANEL WITH GFR
BUN: 9 mg/dL (ref 6–23)
CO2: 29 mEq/L (ref 19–32)
CREATININE: 0.97 mg/dL (ref 0.50–1.35)
Calcium: 9.4 mg/dL (ref 8.4–10.5)
Chloride: 101 mEq/L (ref 96–112)
GFR, Est African American: 87 mL/min
GFR, Est Non African American: 75 mL/min
Glucose, Bld: 120 mg/dL — ABNORMAL HIGH (ref 70–99)
Potassium: 4.8 mEq/L (ref 3.5–5.3)
SODIUM: 138 meq/L (ref 135–145)

## 2014-03-17 NOTE — Progress Notes (Signed)
   Subjective:    Patient ID: Geryl CouncilmanSon T Baez, male    DOB: Apr 12, 1934, 80 y.o.   MRN: [id number]  HPI 80yo M with chronic hepatitis B who has now been on tenofovir for 6 months. [facility] instead of $300 per month if procured from local pharmacy. He is doing well with medications. No jaundice, no nausea. Takes it daily. He has been in good health since last visit. He has questions whether he would be able to be reimbursed for his medications  Current Outpatient Prescriptions on File Prior to Visit  Medication Sig Dispense Refill  . multivitamin-iron-minerals-folic acid (CENTRUM) chewable tablet Chew 1 tablet by mouth daily.    . tamsulosin (FLOMAX) 0.4 MG CAPS capsule Take 1 capsule (0.4 mg total) by mouth daily. 30 capsule 10  . tenofovir (VIREAD) 300 MG tablet Take 1 tablet (300 mg total) by mouth daily. 30 tablet 11   No current facility-administered medications on file prior to visit.   Active Ambulatory Problems    Diagnosis Date Noted  . HYPERLIPIDEMIA 06/15/2007  . GERD 03/12/2007  . BACK PAIN 02/16/2007  . DIZZINESS 05/16/2009  . Hepatitis B 06/15/2007  . 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 Annual physical exam 01/01/2011  . BPH (benign prostatic hyperplasia) 01/01/2011  . Pain in joint, shoulder region 06/28/2013   Resolved Ambulatory Problems    Diagnosis Date Noted  . No Resolved Ambulatory Problems   Past Medical History  Diagnosis Date  . GERD (gastroesophageal reflux disease)   . Hyperlipidemia      Review of Systems  Constitutional: Negative for fever, chills, diaphoresis, activity change, appetite change, fatigue and unexpected weight change.  HENT: Negative for congestion, sore throat, rhinorrhea, sneezing, trouble swallowing and sinus pressure.  Eyes: Negative for photophobia and visual disturbance.  Respiratory: Negative for cough, chest tightness, shortness of breath, wheezing and stridor.  Cardiovascular: Negative for  chest pain, palpitations and leg swelling.  Gastrointestinal: Negative for nausea, vomiting, abdominal pain, diarrhea, constipation, blood in stool, abdominal distention and anal bleeding.  Genitourinary: Negative for dysuria, hematuria, flank pain and difficulty urinating.  Musculoskeletal: Negative for myalgias, back pain, joint swelling, arthralgias and gait problem.  Skin: Negative for color change, pallor, rash and wound.  Neurological: Negative for dizziness, tremors, weakness and light-headedness.  Hematological: Negative for adenopathy. Does not bruise/bleed easily.  Psychiatric/Behavioral: Negative for behavioral problems, confusion, sleep disturbance, dysphoric mood, decreased concentration and agitation.       Objective:   Physical Exam BP 122/69 mmHg  Pulse 72  Temp(Src) 97.6 F (36.4 C) (Oral)  Wt 127 lb (57.607 kg) gen = a xo by 3 in nad heent = no scleral icterus, no op lesions Abd= ntnd, soft, scaphoid, bs+ Skin = no rash       Assessment & Plan:  Chronic hepatitis b infection = will have him check bmp today. Return in 3 months for cmp and we wil see him back at  6months, roughly 1 year mark since being on treatment for chronic hepatitis b.  Spent 15 min face to face time in discussing management of chronic hep b infection

## 2014-03-25 DIAGNOSIS — H10433 Chronic follicular conjunctivitis, bilateral: Secondary | ICD-10-CM | POA: Diagnosis not present

## 2014-06-16 ENCOUNTER — Other Ambulatory Visit: Payer: Medicare Other

## 2014-06-16 DIAGNOSIS — B1910 Unspecified viral hepatitis B without hepatic coma: Secondary | ICD-10-CM

## 2014-06-16 DIAGNOSIS — B169 Acute hepatitis B without delta-agent and without hepatic coma: Secondary | ICD-10-CM | POA: Diagnosis not present

## 2014-06-17 LAB — BASIC METABOLIC PANEL
BUN: 13 mg/dL (ref 6–23)
CALCIUM: 8.8 mg/dL (ref 8.4–10.5)
CHLORIDE: 103 meq/L (ref 96–112)
CO2: 29 meq/L (ref 19–32)
Creat: 1.08 mg/dL (ref 0.50–1.35)
GLUCOSE: 108 mg/dL — AB (ref 70–99)
POTASSIUM: 4.5 meq/L (ref 3.5–5.3)
Sodium: 138 mEq/L (ref 135–145)

## 2014-07-13 DIAGNOSIS — H43393 Other vitreous opacities, bilateral: Secondary | ICD-10-CM | POA: Diagnosis not present

## 2014-08-30 ENCOUNTER — Other Ambulatory Visit: Payer: Self-pay | Admitting: *Deleted

## 2014-08-30 ENCOUNTER — Telehealth: Payer: Self-pay | Admitting: *Deleted

## 2014-08-30 ENCOUNTER — Ambulatory Visit (INDEPENDENT_AMBULATORY_CARE_PROVIDER_SITE_OTHER): Payer: Medicare Other | Admitting: Internal Medicine

## 2014-08-30 ENCOUNTER — Encounter: Payer: Self-pay | Admitting: Internal Medicine

## 2014-08-30 VITALS — BP 127/78 | HR 60 | Temp 97.5°F | Wt 134.0 lb

## 2014-08-30 DIAGNOSIS — B181 Chronic viral hepatitis B without delta-agent: Secondary | ICD-10-CM

## 2014-08-30 DIAGNOSIS — B180 Chronic viral hepatitis B with delta-agent: Secondary | ICD-10-CM

## 2014-08-30 LAB — COMPLETE METABOLIC PANEL WITH GFR
ALK PHOS: 80 U/L (ref 39–117)
ALT: 24 U/L (ref 0–53)
AST: 27 U/L (ref 0–37)
Albumin: 4.4 g/dL (ref 3.5–5.2)
BILIRUBIN TOTAL: 0.6 mg/dL (ref 0.2–1.2)
BUN: 12 mg/dL (ref 6–23)
CO2: 28 mEq/L (ref 19–32)
CREATININE: 1.06 mg/dL (ref 0.50–1.35)
Calcium: 9.4 mg/dL (ref 8.4–10.5)
Chloride: 102 mEq/L (ref 96–112)
GFR, EST NON AFRICAN AMERICAN: 67 mL/min
GFR, Est African American: 77 mL/min
GLUCOSE: 105 mg/dL — AB (ref 70–99)
Potassium: 4.5 mEq/L (ref 3.5–5.3)
Sodium: 138 mEq/L (ref 135–145)
Total Protein: 7.4 g/dL (ref 6.0–8.3)

## 2014-08-30 LAB — HEPATITIS B SURFACE ANTIBODY,QUALITATIVE: HEP B S AB: NEGATIVE

## 2014-08-30 LAB — HEPATITIS A ANTIBODY, TOTAL: HEP A TOTAL AB: REACTIVE — AB

## 2014-08-30 MED ORDER — TENOFOVIR DISOPROXIL FUMARATE 300 MG PO TABS: 300.0000 mg | ORAL_TABLET | Freq: Every day | ORAL | Status: DC

## 2014-08-30 NOTE — Progress Notes (Signed)
  Rfv: chronic hepatitis b  Subjective:    Patient ID: Kevin Hutchinson, male    DOB: 1934/11/22, 80 y.o.   MRN: [id number]  HPI 80yo M with chronic hepatitis B, now has been on treatment for nearly 1 year. He currently uses a [place] pharmacy to get his medicaitons since it is cheaper than what would be offered through his current insurance. He is taking his last months dose and wondered if there is anything cheaper to try  Current Outpatient Prescriptions on File Prior to Visit  Medication Sig Dispense Refill  . multivitamin-iron-minerals-folic acid (CENTRUM) chewable tablet Chew 1 tablet by mouth daily.    . tamsulosin (FLOMAX) 0.4 MG CAPS capsule Take 1 capsule (0.4 mg total) by mouth daily. 30 capsule 10  . tenofovir (VIREAD) 300 MG tablet Take 1 tablet (300 mg total) by mouth daily. 30 tablet 11   No current facility-administered medications on file prior to visit.   Active Ambulatory Problems    Diagnosis Date Noted  . HYPERLIPIDEMIA 06/15/2007  . GERD 03/12/2007  . BACK PAIN 02/16/2007  . DIZZINESS 05/16/2009  . Hepatitis B 06/15/2007  . 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 Annual physical exam 01/01/2011  . BPH (benign prostatic hyperplasia) 01/01/2011  . Pain in joint, shoulder region 06/28/2013   Resolved Ambulatory Problems    Diagnosis Date Noted  . No Resolved Ambulatory Problems   Past Medical History  Diagnosis Date  . GERD (gastroesophageal reflux disease)   . Hyperlipidemia       Review of Systems 10 point ros is negative    Objective:   Physical Exam BP 127/78 mmHg  Pulse 60  Temp(Src) 97.5 F (36.4 C)  Wt 134 lb (60.782 kg) Physical Exam  Constitutional: He is oriented to person, place, and time. He appears well-developed and well-nourished. No distress.  HENT:  Mouth/Throat: Oropharynx is clear and moist. No oropharyngeal exudate.  Cardiovascular: Normal rate, regular rhythm and normal heart sounds. Exam reveals no gallop and no friction rub.  No murmur  heard.  Pulmonary/Chest: Effort normal and breath sounds normal. No respiratory distress. He has no wheezes.  Abdominal: Soft. Bowel sounds are normal. He exhibits no distension. There is no tenderness. No hsm Lymphadenopathy:  He has no cervical adenopathy.  Skin: Skin is warm and dry. No rash noted. No erythema.          Assessment & Plan:  Chronic hepatitis b = will check cmp, hep b viral load, and hep b e antigen, hep b surface ab  Will see if can get tenofovir cheaper for him through patient assistance  Plan on seeing him back in 6 months

## 2014-08-30 NOTE — Telephone Encounter (Signed)
Viread (name brand only) is covered by patient's insurance, but is a Tier 5 medication.  He will have to pay 29% of retail cost.  At [facility], his copay for a 90 day supply is 944.17 (314/month).  [facility], it will be 413.04/month; 327.[facility].   Per Almon RegisterSteve Furr, pharmacist, patient's diagnosis will qualify him at [employer] for assistance.  Will cc Randolm IdolPam Jones for further assistance. Per Alesia Morinravis Poole, CMA, patient has medication via a [place] pharmacy. Andree CossHowell, Wilburta Milbourn M, RN

## 2014-09-01 LAB — HEPATITIS B E ANTIGEN: Hepatitis Be Antigen: NONREACTIVE

## 2014-09-01 NOTE — Telephone Encounter (Signed)
Patient given Rx as through an interpreter his wife advised they can get a better price the way they have been ordering his medication. Per Dr Drue SecondSnider gave them contact information to give their child so that if they have any questions later or need any assistance they can contact the office and ask.

## 2014-09-06 LAB — HEPATITIS B DNA, ULTRAQUANTITATIVE, PCR

## 2014-09-08 ENCOUNTER — Telehealth: Payer: Self-pay | Admitting: *Deleted

## 2014-09-08 NOTE — Telephone Encounter (Signed)
Patient soon Kevin Hutchinson called and advised they have sent the Rx off and are waiting on delivery. He also advised he received a call from someone who told him that they could bring his receipt and get reimbursed for the cost of the medication. He was unsure who had called as he was working and unable to write down the information at the time of the call. Advised him not sure who would have called him as there is no documentation and he was very insistant that the call did happen. Advised him I will ask around and try to find out who called him but it may take a while as some people will not be back in until Monday 09/12/14 but that I will try and find out and give him a call back asap. He advised to call him at number listed below.  Fortino SicJimmy Hutchinson [phone]

## 2014-11-30 DIAGNOSIS — H9313 Tinnitus, bilateral: Secondary | ICD-10-CM | POA: Diagnosis not present

## 2014-11-30 DIAGNOSIS — H903 Sensorineural hearing loss, bilateral: Secondary | ICD-10-CM | POA: Diagnosis not present

## 2015-02-23 ENCOUNTER — Ambulatory Visit: Payer: Self-pay | Admitting: Internal Medicine

## 2015-03-16 ENCOUNTER — Ambulatory Visit (INDEPENDENT_AMBULATORY_CARE_PROVIDER_SITE_OTHER): Payer: Medicare Other | Admitting: Internal Medicine

## 2015-03-16 ENCOUNTER — Encounter: Payer: Self-pay | Admitting: Internal Medicine

## 2015-03-16 VITALS — BP 113/71 | HR 74 | Temp 97.5°F | Wt 133.0 lb

## 2015-03-16 DIAGNOSIS — Z23 Encounter for immunization: Secondary | ICD-10-CM | POA: Diagnosis not present

## 2015-03-16 DIAGNOSIS — Z0000 Encounter for general adult medical examination without abnormal findings: Secondary | ICD-10-CM | POA: Diagnosis not present

## 2015-03-16 DIAGNOSIS — B181 Chronic viral hepatitis B without delta-agent: Secondary | ICD-10-CM

## 2015-03-16 DIAGNOSIS — H9190 Unspecified hearing loss, unspecified ear: Secondary | ICD-10-CM | POA: Diagnosis not present

## 2015-03-16 HISTORY — DX: Unspecified hearing loss, unspecified ear: H91.90

## 2015-03-16 LAB — COMPLETE METABOLIC PANEL WITH GFR
ALBUMIN: 4.4 g/dL (ref 3.6–5.1)
ALK PHOS: 72 U/L (ref 40–115)
ALT: 25 U/L (ref 9–46)
AST: 32 U/L (ref 10–35)
BILIRUBIN TOTAL: 0.7 mg/dL (ref 0.2–1.2)
BUN: 15 mg/dL (ref 7–25)
CO2: 27 mmol/L (ref 20–31)
CREATININE: 1.08 mg/dL (ref 0.70–1.18)
Calcium: 9.7 mg/dL (ref 8.6–10.3)
Chloride: 100 mmol/L (ref 98–110)
GFR, Est African American: 76 mL/min (ref >=60)
GFR, Est Non African American: 65 mL/min (ref >=60)
GLUCOSE: 105 mg/dL — AB (ref 65–99)
Potassium: 4.8 mmol/L (ref 3.5–5.3)
SODIUM: 137 mmol/L (ref 135–146)
TOTAL PROTEIN: 7.4 g/dL (ref 6.1–8.1)

## 2015-03-16 NOTE — Progress Notes (Signed)
  Rfv: chronic hep B Subjective:    Patient ID: Kevin Hutchinson, male    DOB: 1934-10-24, 81 y.o.   MRN: [id number]  HPI Kevin Hutchinson is doing well with treatment with tenofovir. He has been undetectable viral load with hep B e Ab  + at last visit in July. He  was supposed to stop at end of the year. He has reordered meds from [place]. He has 2 months left.   All is well with his health. Recently given hearing aids that he is not wearing this morning.  No Known Allergies Current Outpatient Prescriptions on File Prior to Visit  Medication Sig Dispense Refill  . multivitamin-iron-minerals-folic acid (CENTRUM) chewable tablet Chew 1 tablet by mouth daily. Reported on 03/16/2015    . tamsulosin (FLOMAX) 0.4 MG CAPS capsule Take 1 capsule (0.4 mg total) by mouth daily. (Patient not taking: Reported on 03/16/2015) 30 capsule 10  . tenofovir (VIREAD) 300 MG tablet Take 1 tablet (300 mg total) by mouth daily. (Patient not taking: Reported on 03/16/2015) 30 tablet 11   No current facility-administered medications on file prior to visit.   Active Ambulatory Problems    Diagnosis Date Noted  . HYPERLIPIDEMIA 06/15/2007  . GERD 03/12/2007  . BACK PAIN 02/16/2007  . DIZZINESS 05/16/2009  . Hepatitis B 06/15/2007  . 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 Annual physical exam 01/01/2011  . BPH (benign prostatic hyperplasia) 01/01/2011  . Pain in joint, shoulder region 06/28/2013   Resolved Ambulatory Problems    Diagnosis Date Noted  . No Resolved Ambulatory Problems   Past Medical History  Diagnosis Date  . GERD (gastroesophageal reflux disease)   . Hyperlipidemia       Review of Systems  Constitutional: Negative for fever, chills, diaphoresis, activity change, appetite change, fatigue and unexpected weight change.  HENT: decrease hearing. Negative for congestion, sore throat, rhinorrhea, sneezing, trouble swallowing and sinus pressure.  Eyes: Negative for photophobia and visual disturbance.    Respiratory: Negative for cough, chest tightness, shortness of breath, wheezing and stridor.  Cardiovascular: Negative for chest pain, palpitations and leg swelling.  Gastrointestinal: Negative for nausea, vomiting, abdominal pain, diarrhea, constipation, blood in stool, abdominal distention and anal bleeding.  Genitourinary: Negative for dysuria, hematuria, flank pain and difficulty urinating.  Musculoskeletal: Negative for myalgias, back pain, joint swelling, arthralgias and gait problem.  Skin: Negative for color change, pallor, rash and wound.  Neurological: Negative for dizziness, tremors, weakness and light-headedness.  Hematological: Negative for adenopathy. Does not bruise/bleed easily.  Psychiatric/Behavioral: Negative for behavioral problems, confusion, sleep disturbance, dysphoric mood, decreased concentration and agitation.       Objective:   Physical Exam BP 113/71 mmHg  Pulse 74  Temp(Src) 97.5 F (36.4 C) (Oral)  Wt 133 lb (60.328 kg) Did not examine during visit       Assessment & Plan:  Chronic hep b = will get labs today, and have his do annual ruq u/s. Will see back in 6 months, roughly 3 months after he has finished his tenofovir. Have him finish off 2 months supply  This was done with [place] translator  Health maintenance = will give  flu vaccine to him and his wife  Hearing loss = if worsens, to be re-evaluated. Recommend to have him wear aids more often

## 2015-03-17 LAB — HEPATITIS B SURFACE ANTIGEN: HEP B S AG: POSITIVE — AB

## 2015-03-17 LAB — HEPATITIS B SURF AG CONFIRMATION: Hepatitis B Surf Ag Confirmation: POSITIVE — AB

## 2015-03-17 LAB — HEPATITIS B SURFACE ANTIBODY,QUALITATIVE: Hep B S Ab: NEGATIVE

## 2015-03-20 LAB — HEPATITIS B E ANTIBODY: HEPATITIS BE ANTIBODY: REACTIVE — AB

## 2015-03-23 LAB — HEPATITIS B DNA, ULTRAQUANTITATIVE, PCR
Hepatitis B DNA (Calc): 1.52 Log IU/mL — ABNORMAL HIGH (ref <1.30)
Hepatitis B DNA: 33 IU/mL — ABNORMAL HIGH (ref <20)

## 2015-03-28 MED FILL — VIREAD 300 MG TABLET: 300 | 30 days supply | Qty: 30 | Fill #0

## 2015-04-04 ENCOUNTER — Telehealth: Payer: Self-pay | Admitting: Pharmacist Clinician (PhC)/ Clinical Pharmacy Specialist

## 2015-04-04 NOTE — Telephone Encounter (Signed)
Called to tell [facility] about [facility] [place] [facility] and instruction for no food or drink before procedure. I also relayed the message to his Nery who will likely take him there.

## 2015-04-12 ENCOUNTER — Ambulatory Visit (HOSPITAL_COMMUNITY)
Admission: RE | Admit: 2015-04-12 | Discharge: 2015-04-12 | Disposition: A | Discharge status: Home / Self Care | Payer: Medicare Other | Source: Ambulatory Visit | Attending: Internal Medicine | Admitting: Internal Medicine

## 2015-04-12 DIAGNOSIS — K769 Liver disease, unspecified: Secondary | ICD-10-CM | POA: Diagnosis not present

## 2015-04-12 DIAGNOSIS — B181 Chronic viral hepatitis B without delta-agent: Secondary | ICD-10-CM | POA: Diagnosis not present

## 2015-04-26 MED FILL — VIREAD 300 MG TABLET: 300 | 30 days supply | Qty: 30 | Fill #1

## 2015-05-18 IMAGING — US US ABDOMEN COMPLETE
1 series · 14 of 25 positions shown · non-contrast
Comparison: 05/18/2003.

CLINICAL DATA: Elevated liver function tests with intermittent
left upper quadrant pain.

COMPLETE ABDOMINAL ULTRASOUND

[Series 1: us abdomen complete · 0.35mm/px · 14 of 78 slices shown]
[im 1/78]
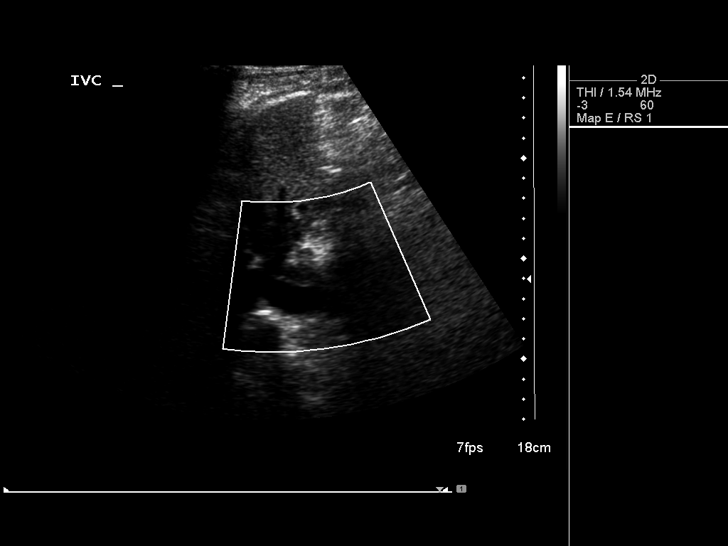
[im 7/78]
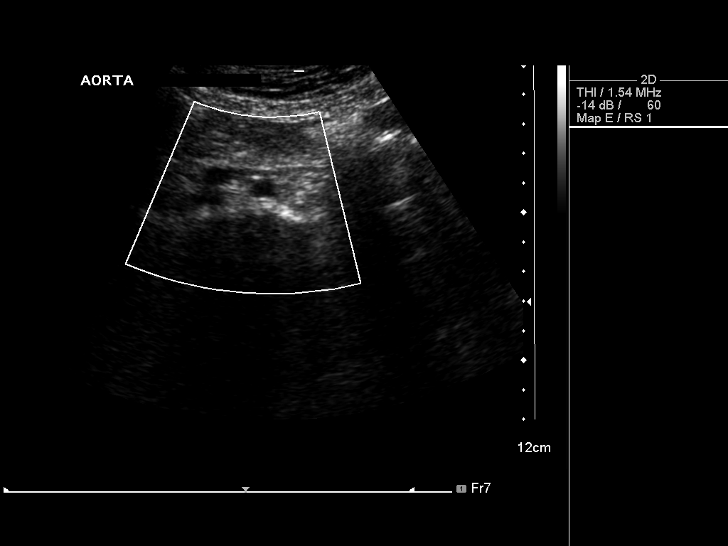
[im 13/78]
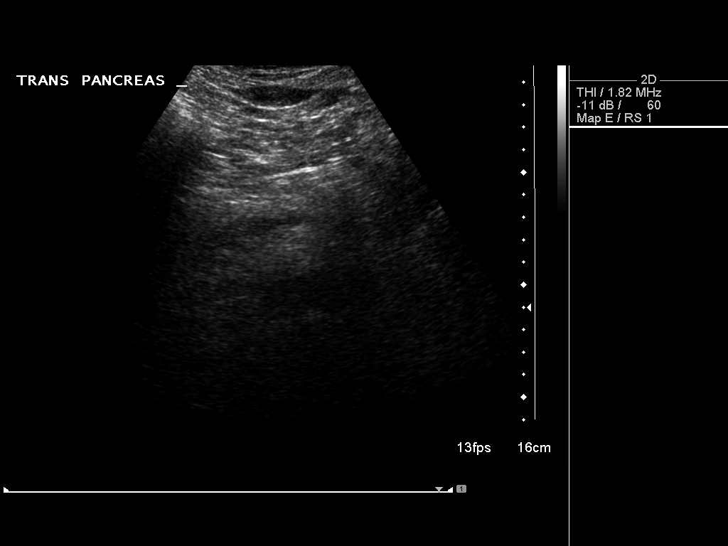
[im 20/78]
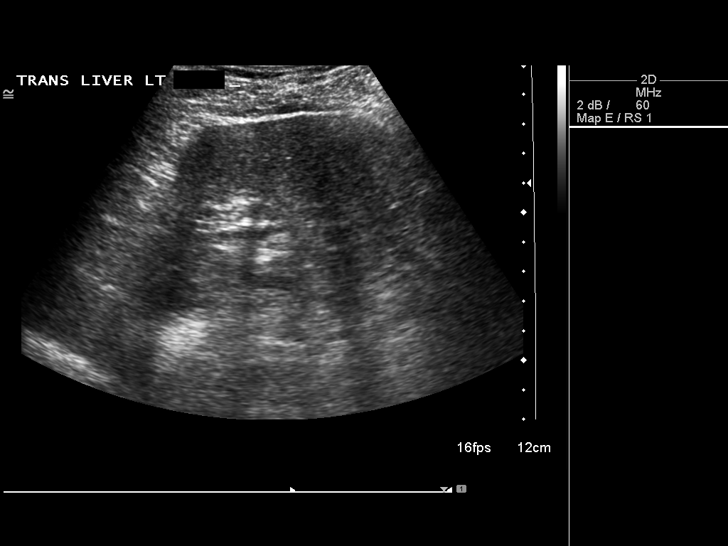
[im 26/78]
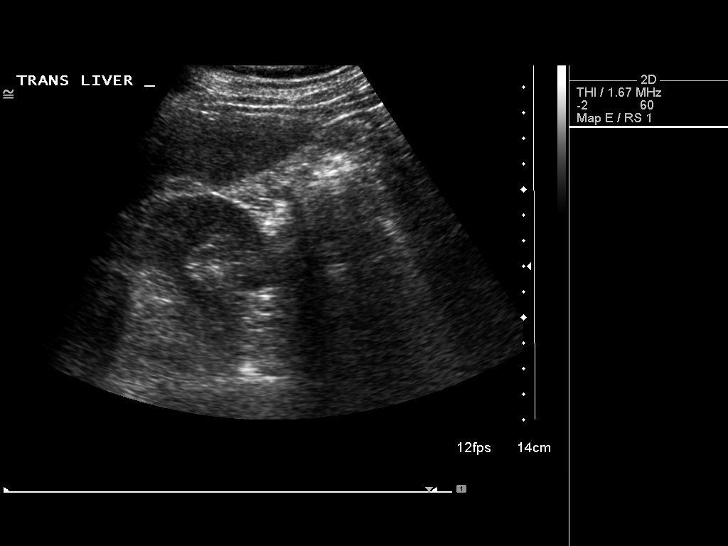
[im 29/78]
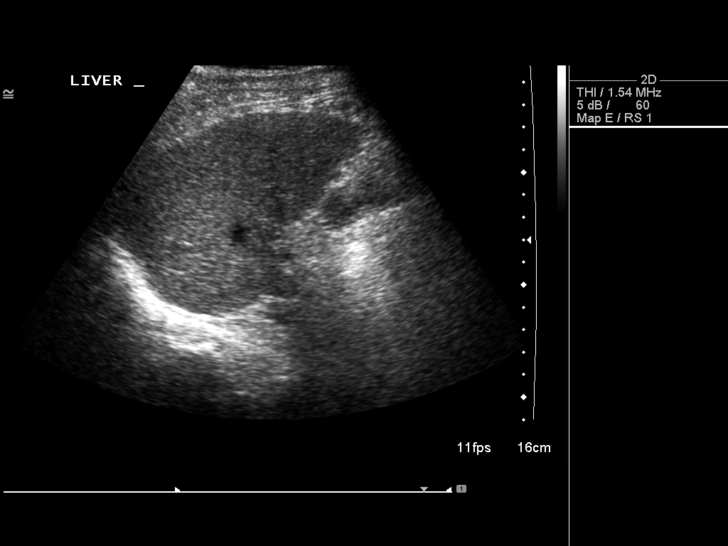
[im 36/78]
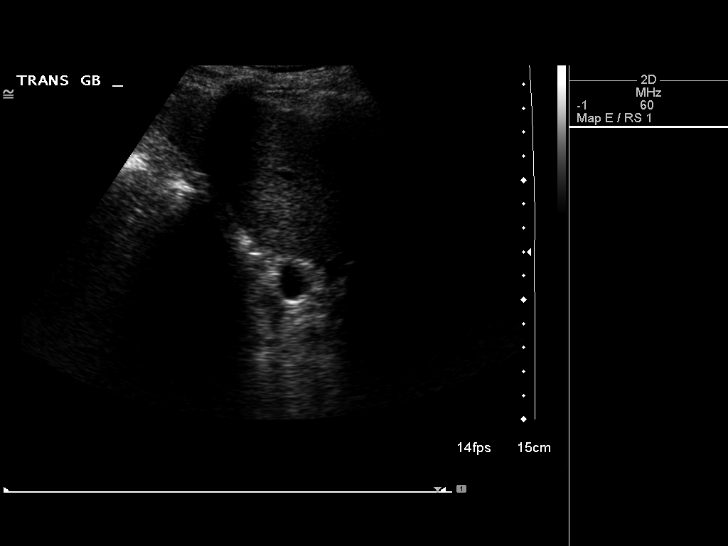
[im 42/78]
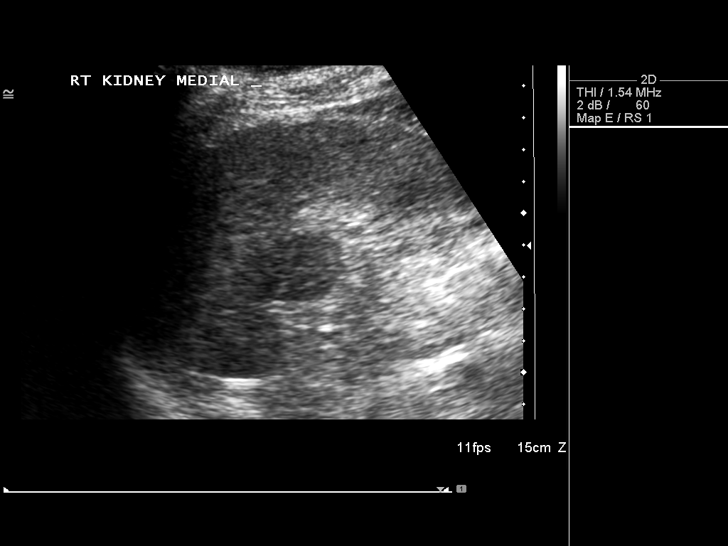
[im 49/78]
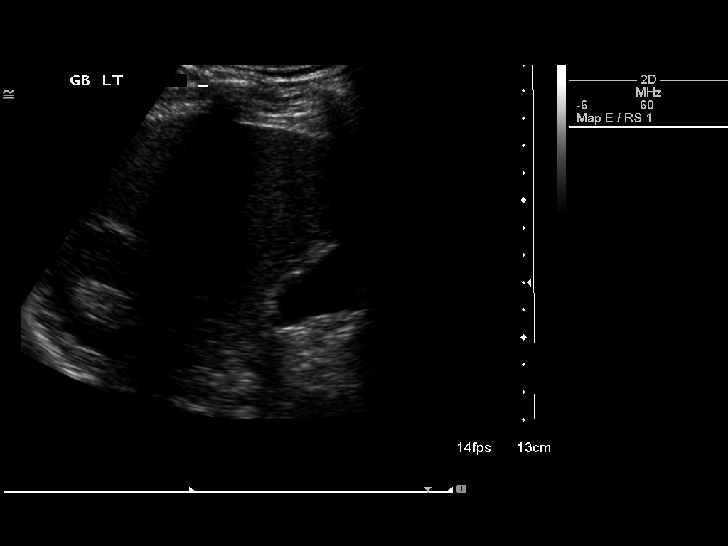
[im 52/78]
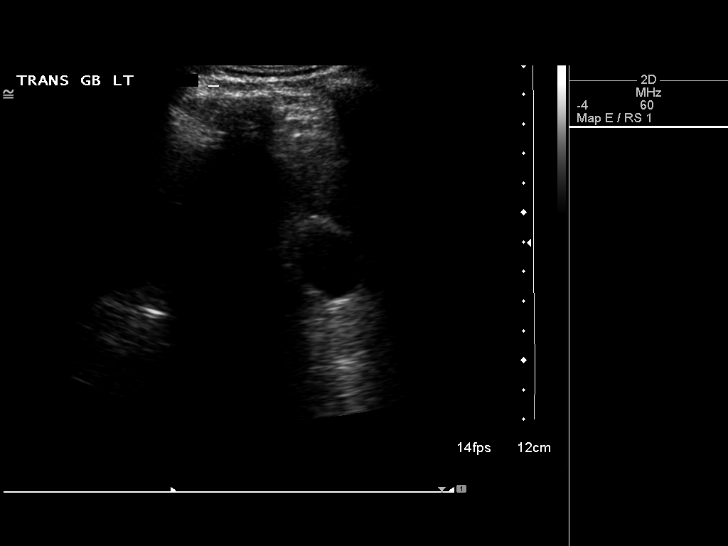
[im 58/78]
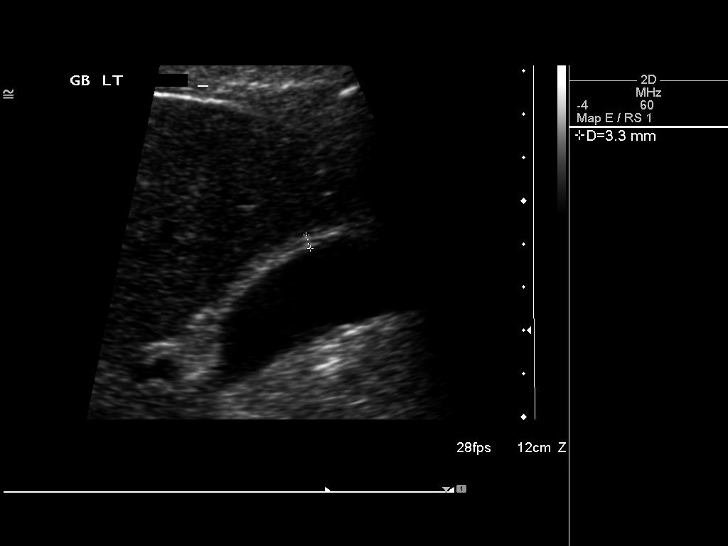
[im 65/78]
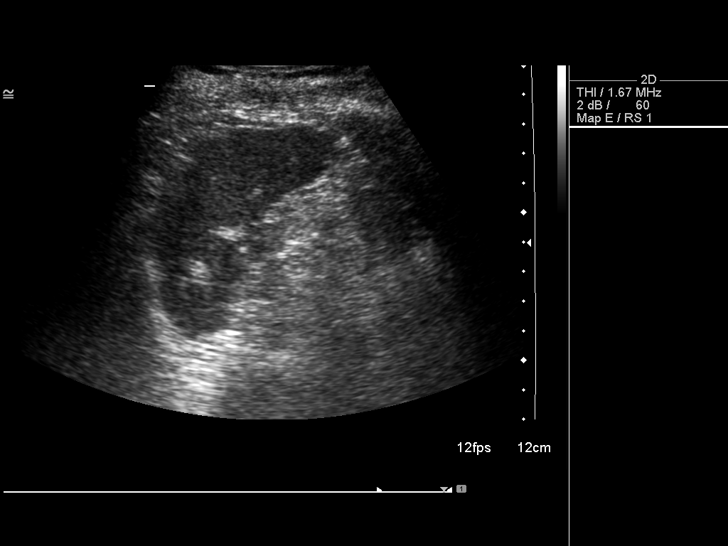
[im 71/78]
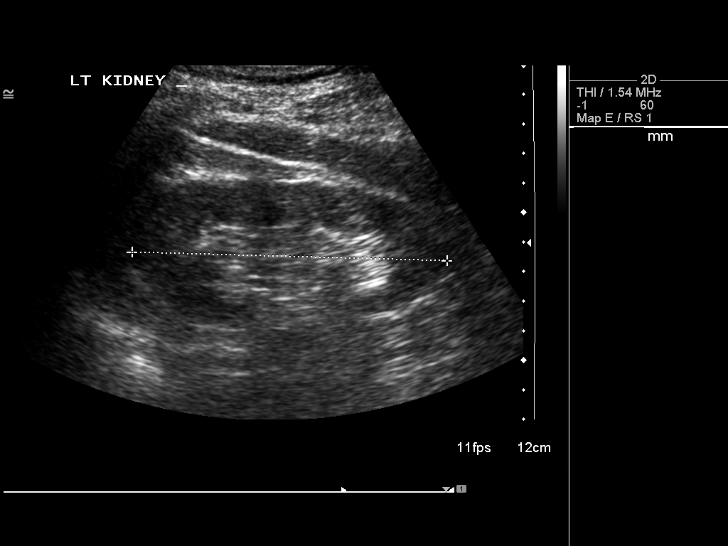
[im 78/78]
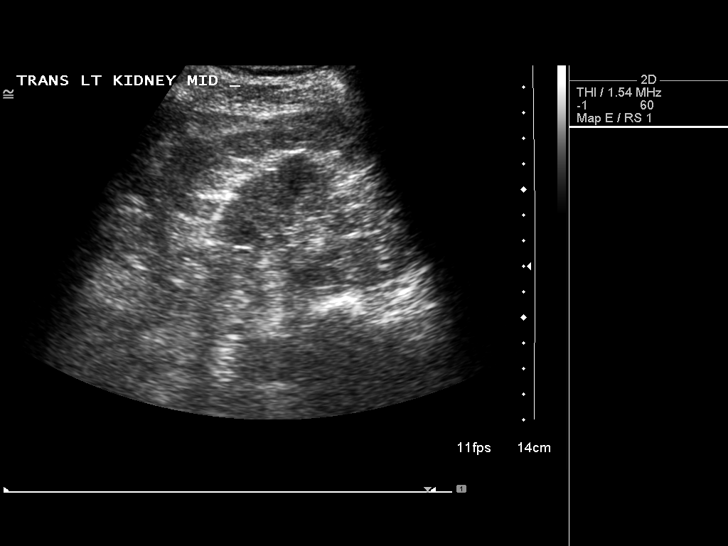

[14 of 25 positions shown; findings below may reference images not displayed]

FINDINGS: Gallbladder:  No gallstones, gallbladder wall thickening, or
pericholecystic fluid.

Common bile duct:  Measures 4 mm, within normal limits.

Liver:  No focal lesion identified.  Within normal limits in
parenchymal echogenicity.

IVC:  Appears normal.

Pancreas:  Poorly visualized due to bowel gas.

Spleen:  Measures 6.7 cm, negative.

Right Kidney:  Measures 9.7 cm with minimal cortical thinning.
Parenchymal echogenicity is normal.  No hydronephrosis.  No focal
lesion.

Left Kidney:  Measures 10.7 cm with minimal cortical thinning.
Parenchymal echogenicity is normal.  No hydronephrosis.  No focal
lesion.

Abdominal aorta:  Atherosclerotic appearance.  No aneurysm.
IMPRESSION: No acute findings.

## 2015-05-25 MED FILL — VIREAD 300 MG TABLET: 300 | 30 days supply | Qty: 30 | Fill #2

## 2015-06-23 MED FILL — VIREAD 300 MG TABLET: 300 | 30 days supply | Qty: 30 | Fill #3

## 2015-07-25 MED FILL — VIREAD 300 MG TABLET: 300 | 30 days supply | Qty: 30 | Fill #4

## 2015-08-09 ENCOUNTER — Encounter: Payer: Self-pay | Admitting: Internal Medicine

## 2015-08-23 MED FILL — VIREAD 300 MG TABLET: 300 | 30 days supply | Qty: 30 | Fill #5

## 2015-09-21 ENCOUNTER — Other Ambulatory Visit: Payer: Self-pay | Admitting: Internal Medicine

## 2015-09-21 ENCOUNTER — Telehealth: Payer: Self-pay | Admitting: *Deleted

## 2015-09-21 DIAGNOSIS — B180 Chronic viral hepatitis B with delta-agent: Secondary | ICD-10-CM

## 2015-09-21 NOTE — Telephone Encounter (Signed)
Refill request from [facility] pharmacy for Viread sent in error. Pharmacy notified to disregard; patient should be done with this medication. Kevin Hutchinson

## 2015-09-25 ENCOUNTER — Other Ambulatory Visit: Payer: Self-pay | Admitting: Internal Medicine

## 2015-09-25 DIAGNOSIS — B180 Chronic viral hepatitis B with delta-agent: Secondary | ICD-10-CM

## 2015-09-28 ENCOUNTER — Telehealth: Payer: Self-pay | Admitting: *Deleted

## 2015-09-28 ENCOUNTER — Other Ambulatory Visit: Payer: Medicare Other

## 2015-09-28 ENCOUNTER — Telehealth: Payer: Self-pay | Admitting: Pharmacist Clinician (PhC)/ Clinical Pharmacy Specialist

## 2015-09-28 DIAGNOSIS — B161 Acute hepatitis B with delta-agent without hepatic coma: Secondary | ICD-10-CM | POA: Diagnosis not present

## 2015-09-28 DIAGNOSIS — B1910 Unspecified viral hepatitis B without hepatic coma: Secondary | ICD-10-CM

## 2015-09-28 LAB — COMPREHENSIVE METABOLIC PANEL
ALBUMIN: 4.5 g/dL (ref 3.6–5.1)
ALK PHOS: 70 U/L (ref 40–115)
ALT: 25 U/L (ref 9–46)
AST: 29 U/L (ref 10–35)
BILIRUBIN TOTAL: 0.6 mg/dL (ref 0.2–1.2)
BUN: 18 mg/dL (ref 7–25)
CALCIUM: 9.4 mg/dL (ref 8.6–10.3)
CO2: 25 mmol/L (ref 20–31)
CREATININE: 1.32 mg/dL — AB (ref 0.70–1.18)
Chloride: 103 mmol/L (ref 98–110)
Glucose, Bld: 129 mg/dL — ABNORMAL HIGH (ref 65–99)
Potassium: 4.6 mmol/L (ref 3.5–5.3)
Sodium: 138 mmol/L (ref 135–146)
TOTAL PROTEIN: 7.4 g/dL (ref 6.1–8.1)

## 2015-09-28 NOTE — Telephone Encounter (Signed)
He was trying to get a refill of his TDF for the hep B at the outpt pharmacy the other day. The pharmacy did not fill it. We told him to stop the med after he finished the supply that he got from [place] at the last visit. His HBeab was positive at the last visit. He has an appt with Dr. Drue Second in mid august. Spoke to the [place] today and explained that to him.

## 2015-09-28 NOTE — Telephone Encounter (Signed)
Patient in for lab appointment, asked to speak with a nurse regarding refills of viread. Patient's last pill was 1 week ago. He did not understand that he was supposed to stop treatment in March. Patient will follow up with Dr. Drue Second in 2 weeks. Andree Coss, RN

## 2015-10-03 LAB — HEPATITIS B DNA, ULTRAQUANTITATIVE, PCR
Hepatitis B DNA (Calc): <1.3 Log IU/mL (ref <1.30)
Hepatitis B DNA: <20 IU/mL (ref <20)

## 2015-10-12 ENCOUNTER — Ambulatory Visit (INDEPENDENT_AMBULATORY_CARE_PROVIDER_SITE_OTHER): Payer: Medicare Other | Admitting: Internal Medicine

## 2015-10-12 DIAGNOSIS — B181 Chronic viral hepatitis B without delta-agent: Secondary | ICD-10-CM | POA: Diagnosis not present

## 2015-10-12 LAB — HEPATITIS B SURFACE ANTIBODY,QUALITATIVE: Hep B S Ab: NEGATIVE

## 2015-10-12 NOTE — Progress Notes (Signed)
   RFV: chronic hepatitis B Patient ID: Kevin Hutchinson, male   DOB: 07/10/1934, 81 y.o.   MRN: [id number]  HPI Kevin Hutchinson is a 81yo M with chronic hepatitis B who has been on tenofovir for roughly 2 years. He has viral suppression per labs. Just finished his recent monthly bottle.  Outpatient Encounter Prescriptions as of 10/12/2015  Medication Sig  . multivitamin-iron-minerals-folic acid (CENTRUM) chewable tablet Chew 1 tablet by mouth daily. Reported on 03/16/2015  . tamsulosin (FLOMAX) 0.4 MG CAPS capsule Take 1 capsule (0.4 mg total) by mouth daily. (Patient not taking: Reported on 03/16/2015)  . VIREAD 300 MG tablet TAKE 1 TABLET (300 MG TOTAL) BY MOUTH DAILY.   No facility-administered encounter medications on file as of 10/12/2015.      Patient Active Problem List   Diagnosis Date Noted  . Hearing loss 03/16/2015  . Pain in joint, shoulder region 06/28/2013  . Annual physical exam 01/01/2011  . BPH (benign prostatic hyperplasia) 01/01/2011  . DIZZINESS 05/16/2009  . HYPERLIPIDEMIA 06/15/2007  . Hepatitis B 06/15/2007  . GERD 03/12/2007  . 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 BACK PAIN 02/16/2007     Health Maintenance Due  Topic Date Due  . INFLUENZA VACCINE  09/26/2015     Review of Systems  Physical Exam   There were no vitals taken for this visit.  Physical Exam  Constitutional: He is oriented to person, place, and time. He appears well-developed and well-nourished. No distress.  HENT:  Mouth/Throat: Oropharynx is clear and moist. No oropharyngeal exudate.  Cardiovascular: Normal rate, regular rhythm and normal heart sounds. Exam reveals no gallop and no friction rub.  No murmur heard.  Pulmonary/Chest: Effort normal and breath sounds normal. No respiratory distress. He has no wheezes.  Abdominal: Soft. Bowel sounds are normal. He exhibits no distension. There is no tenderness.  Lymphadenopathy:  He has no cervical adenopathy.  Neurological: He is alert and oriented to  person, place, and time.  Skin: Skin is warm and dry. No rash noted. No erythema.  Psychiatric: He has a normal mood and affect. His behavior is normal.    Lab Results  Component Value Date   HEPBSAB NEG 10/12/2015   No results found for: RPR  CBC Lab Results  Component Value Date   WBC 7.0 07/14/2013   RBC 5.89 (H) 07/14/2013   HGB 15.4 07/14/2013   HCT 43.6 07/14/2013   PLT 111 (L) 07/14/2013   MCV 74.0 (L) 07/14/2013   MCH 26.1 07/14/2013   MCHC 35.3 07/14/2013   RDW 14.1 07/14/2013   LYMPHSABS 4.4 (H) 03/05/2013   MONOABS 0.6 03/05/2013   EOSABS 0.2 03/05/2013   BASOSABS 0.0 03/05/2013   BMET Lab Results  Component Value Date   NA 138 09/28/2015   K 4.6 09/28/2015   CL 103 09/28/2015   CO2 25 09/28/2015   GLUCOSE 129 (H) 09/28/2015   BUN 18 09/28/2015   CREATININE 1.32 (H) 09/28/2015   CALCIUM 9.4 09/28/2015   GFRNONAA 65 03/16/2015   GFRAA 76 03/16/2015     Assessment and Plan  Chronic hepatitis b = will check hep b s ab. Review if need to continue taking tenofovir. Plan to finish out this month's worth of medicaiton  Health maintenance = recommend for him to get flu vaccine at next month

## 2016-01-24 DIAGNOSIS — H40033 Anatomical narrow angle, bilateral: Secondary | ICD-10-CM | POA: Diagnosis not present

## 2016-01-24 DIAGNOSIS — H04123 Dry eye syndrome of bilateral lacrimal glands: Secondary | ICD-10-CM | POA: Diagnosis not present

## 2016-02-07 ENCOUNTER — Telehealth: Payer: Self-pay | Admitting: Internal Medicine

## 2016-02-07 NOTE — Telephone Encounter (Signed)
06/28/13 [place] PPPS, SUBSEQ VISIT [[id number]] spoke with Govoni,Nick (Ondra) and will touch base with patient and call back to schedule medicare wellness and CPE

## 2016-03-20 ENCOUNTER — Ambulatory Visit (INDEPENDENT_AMBULATORY_CARE_PROVIDER_SITE_OTHER): Payer: Medicare Other | Admitting: Internal Medicine

## 2016-03-20 ENCOUNTER — Encounter: Payer: Self-pay | Admitting: Internal Medicine

## 2016-03-20 VITALS — BP 108/78 | HR 78 | Temp 97.7°F | Resp 14 | Ht 61.3 in | Wt 137.5 lb

## 2016-03-20 DIAGNOSIS — J40 Bronchitis, not specified as acute or chronic: Secondary | ICD-10-CM | POA: Diagnosis not present

## 2016-03-20 MED ORDER — AZITHROMYCIN 250 MG PO TABS: ORAL_TABLET | ORAL | 0 refills | Status: DC

## 2016-03-20 NOTE — Progress Notes (Signed)
Subjective:    Patient ID: Kevin Hutchinson, male    DOB: [birth year], 82 y.o.   MRN: [id number]  DOS:  03/20/2016 Type of visit - description : Acute visit, last seen  09/29/2013 Interval history: Here with his wife, they don't speak [place]. the translator service was not available. I called the patient's Kevin Hutchinson who was very helpful. The patient has been sick for 2 weeks with mostly cough, dry throat, fatigue. Is taking Mucinex fast max which has a decongestant.  Review of Systems No fevers No headaches, nausea, diarrhea. Mild mucus production.   Past Medical History:  Diagnosis Date  . GERD (gastroesophageal reflux disease)    EGD 5-09: : Duodenitis without Hemorrhage, Esophageal Stricture.  . Hepatitis B   . Hyperlipidemia   . PPD positive    Was Rx INH x 3 months (2008)--HD      Past Surgical History:  Procedure Laterality Date  . NO PAST SURGERIES      Social History   Social History  . Marital status: Married    Spouse name: N/A  . Number of children: 5  . Years of education: N/A   Occupational History  . Not on file.   Social History Main Topics  . Smoking status: Former Games developermoker  . Smokeless tobacco: Never Used     Comment: quit in the 90s  . Alcohol use Yes     Comment: RARE  . Drug use: No  . Sexual activity: Not on file   Other Topics Concern  . Not on file   Social History Narrative   Lives w/ wife   [place]      Allergies as of 03/20/2016   No Known Allergies     Medication List       Accurate as of 03/20/16 11:59 PM. Always use your most recent med list.          azithromycin 250 MG tablet Commonly known as:  ZITHROMAX Z-PAK 2 tabs a day the first day, then 1 tab a day x 4 days          Objective:   Physical Exam BP 108/78 (BP Location: Left Arm, Patient Position: Sitting, Cuff Size: Normal)   Pulse 78   Temp 97.7 F (36.5 C) (Oral)   Resp 14   Ht 5' 1.3" (1.557 m)   Wt 137 lb 8 oz (62.4 kg)   SpO2 98%   BMI  25.73 kg/m  General:   Well developed, well nourished . NAD.  HEENT:  Normocephalic . Face symmetric, atraumatic. Both symmetric, nose not congested, sinuses no TTP. Lungs:  CTA B Normal respiratory effort, no intercostal retractions, no accessory muscle use. Heart: RRR,  no murmur.  No pretibial edema bilaterally  Skin: Not pale. Not jaundice Neurologic:  alert & oriented X3.  Speech normal, gait appropriate for age and unassisted Psych--  Cognition and judgment appear intact.  Cooperative with normal attention span and concentration.  Behavior appropriate. No anxious or depressed appearing.      Assessment & Plan:    Anemia GERD-esophageal stricture BPH, slightly increased PSA, declined to see urology 2015 H/o hepatitis B H/o + PPD, INH 3 months 2008  Plan: Mild bronchitis: Respiratory sxs likely related to mild bronchitis, will prescribed Zithromax, recommend to avoid decongestants, see AVS. Instructions were carefully discussed with the patient's Kevin Hutchinson who will discuss with his father. Also encouraged to come back for a checkup at his convenience, he has not been  seen in 3 years.

## 2016-03-20 NOTE — Progress Notes (Signed)
Pre visit review using our clinic review tool, if applicable. No additional management support is needed unless otherwise documented below in the visit note. 

## 2016-03-20 NOTE — Patient Instructions (Addendum)
Rest, fluids , tylenol  For cough:  Take Mucinex DM twice a day as needed until better   For nasal congestion: Use OTC Nasocort or Flonase : 2 nasal sprays on each side of the nose in the morning until you feel better   Avoid decongestants such as  Pseudoephedrine or phenylephrine   Take the antibiotic as prescribed  (zithromax)  Call if not gradually better over the next  10 days  Call anytime if the symptoms are severe  Please schedule a visit for a routine checkup at your earliest convenience. Bring a Nurse, learning disabilitytranslator with you

## 2016-03-21 DIAGNOSIS — Z09 Encounter for follow-up examination after completed treatment for conditions other than malignant neoplasm: Secondary | ICD-10-CM | POA: Insufficient documentation

## 2016-03-21 NOTE — Assessment & Plan Note (Addendum)
Mild bronchitis: Respiratory sxs likely related to mild bronchitis, will prescribed Zithromax, recommend to avoid decongestants, see AVS. Instructions were carefully discussed with the patient's Kevin Hutchinson who will discuss with his father. Also encouraged to come back for a checkup at his convenience, he has not been seen in 3 years.

## 2016-04-04 ENCOUNTER — Ambulatory Visit (INDEPENDENT_AMBULATORY_CARE_PROVIDER_SITE_OTHER): Payer: Medicare Other | Admitting: Internal Medicine

## 2016-04-04 ENCOUNTER — Encounter: Payer: Self-pay | Admitting: Internal Medicine

## 2016-04-04 VITALS — BP 138/80 | HR 75 | Temp 98.0°F | Ht 61.0 in | Wt 135.0 lb

## 2016-04-04 DIAGNOSIS — B180 Chronic viral hepatitis B with delta-agent: Secondary | ICD-10-CM

## 2016-04-04 LAB — CBC WITH DIFFERENTIAL/PLATELET
Basophils Absolute: 74 cells/uL (ref 0–200)
Basophils Relative: 1 %
EOS PCT: 4 %
Eosinophils Absolute: 296 cells/uL (ref 15–500)
HCT: 46.1 % (ref 38.5–50.0)
HEMOGLOBIN: 15.7 g/dL (ref 13.2–17.1)
LYMPHS ABS: 3404 {cells}/uL (ref 850–3900)
Lymphocytes Relative: 46 %
MCH: 26.3 pg — AB (ref 27.0–33.0)
MCHC: 34.1 g/dL (ref 32.0–36.0)
MCV: 77.3 fL — ABNORMAL LOW (ref 80.0–100.0)
MONOS PCT: 7 %
MPV: 10.5 fL (ref 7.5–12.5)
Monocytes Absolute: 518 cells/uL (ref 200–950)
NEUTROS ABS: 3108 {cells}/uL (ref 1500–7800)
Neutrophils Relative %: 42 %
PLATELETS: 140 10*3/uL (ref 140–400)
RBC: 5.96 MIL/uL — AB (ref 4.20–5.80)
RDW: 14.5 % (ref 11.0–15.0)
WBC: 7.4 10*3/uL (ref 3.8–10.8)

## 2016-04-04 LAB — COMPLETE METABOLIC PANEL WITH GFR
ALT: 24 U/L (ref 9–46)
AST: 25 U/L (ref 10–35)
Albumin: 4.5 g/dL (ref 3.6–5.1)
Alkaline Phosphatase: 63 U/L (ref 40–115)
BUN: 12 mg/dL (ref 7–25)
CO2: 29 mmol/L (ref 20–31)
Calcium: 9.4 mg/dL (ref 8.6–10.3)
Chloride: 102 mmol/L (ref 98–110)
Creat: 0.96 mg/dL (ref 0.70–1.18)
GFR, EST NON AFRICAN AMERICAN: 75 mL/min (ref >=60)
GFR, Est African American: 87 mL/min (ref >=60)
GLUCOSE: 128 mg/dL — AB (ref 65–99)
POTASSIUM: 5 mmol/L (ref 3.5–5.3)
SODIUM: 139 mmol/L (ref 135–146)
TOTAL PROTEIN: 7.7 g/dL (ref 6.1–8.1)
Total Bilirubin: 0.6 mg/dL (ref 0.2–1.2)

## 2016-04-04 LAB — HEPATITIS B SURFACE ANTIBODY,QUALITATIVE: HEP B S AB: NEGATIVE

## 2016-04-04 NOTE — Patient Instructions (Signed)
  On Tuesday 2/27, [facility] 10:45 am [facility]  Please do not eat or drink after midnight, the night before

## 2016-04-04 NOTE — Progress Notes (Signed)
RFV: chronic hepatitis b  Patient ID: Kevin Hutchinson, male   DOB: 12/20/1934, 82 y.o.   MRN: [id number]  HPI [name] is a 82yo M with chronic hepatitis B, HbSAg +, HbSAb-, hepatitis B e antibody +, VL 300,000, AST 84/ALT112, started treatment in 2015, had quickly become undetectable on TDF. Transaminitis decreased to 20s while on treatment, annual ultrasound no signs of HCC. Due to cost, last fall wondered if he could stop treatment. He has been off treatment 3 months. No signs of hep b flare. He is recently recovering from URI. He finished azithromycin, still taking mucinex for residual cough. He reports also having fatigue, loss of appetite, with this acute illness.   Outpatient Encounter Prescriptions as of 04/04/2016  Medication Sig  . azithromycin (ZITHROMAX Z-PAK) 250 MG tablet 2 tabs a day the first day, then 1 tab a day x 4 days   No facility-administered encounter medications on file as of 04/04/2016.      Patient Active Problem List   Diagnosis Date Noted  . PCP NOTES >>>>>>>>>>>>>>. 03/21/2016  . Hearing loss 03/16/2015  . Pain in joint, shoulder region 06/28/2013  . Annual physical exam 01/01/2011  . BPH (benign prostatic hyperplasia) 01/01/2011  . DIZZINESS 05/16/2009  . HYPERLIPIDEMIA 06/15/2007  . Hepatitis B 06/15/2007  . GERD 03/12/2007  . 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 BACK PAIN 02/16/2007     There are no preventive care reminders to display for this patient.   Review of Systems Review of Systems  Constitutional: Negative for fever, chills, diaphoresis, activity change, appetite change, fatigue and unexpected weight change.  HENT: Negative for congestion, sore throat, rhinorrhea, sneezing, trouble swallowing and sinus pressure.  Eyes: Negative for photophobia and visual disturbance.  Respiratory: Negative for cough, chest tightness, shortness of breath, wheezing and stridor.  Cardiovascular: Negative for chest pain, palpitations and leg swelling.    Gastrointestinal: Negative for nausea, vomiting, abdominal pain, diarrhea, constipation, blood in stool, abdominal distention and anal bleeding.  Genitourinary: Negative for dysuria, hematuria, flank pain and difficulty urinating.  Musculoskeletal: Negative for myalgias, back pain, joint swelling, arthralgias and gait problem.  Skin: Negative for color change, pallor, rash and wound.  Neurological: Negative for dizziness, tremors, weakness and light-headedness.  Hematological: Negative for adenopathy. Does not bruise/bleed easily.  Psychiatric/Behavioral: Negative for behavioral problems, confusion, sleep disturbance, dysphoric mood, decreased concentration and agitation.    Physical Exam  BP 138/80   Pulse 75   Temp 98 F (36.7 C) (Oral)   Ht 5\' 1"  (1.549 m)   Wt 135 lb (61.2 kg)   BMI 25.51 kg/m   Physical Exam  Constitutional: He is oriented to person, place, and time. He appears well-developed and well-nourished. No distress.  HENT:  Mouth/Throat: Oropharynx is clear and moist. No oropharyngeal exudate.  Cardiovascular: Normal rate, regular rhythm and normal heart sounds. Exam reveals no gallop and no friction rub.  No murmur heard.  Pulmonary/Chest: Effort normal and breath sounds normal. No respiratory distress. He has no wheezes.  Abdominal: Soft. Bowel sounds are normal. He exhibits no distension. There is no tenderness.  Lymphadenopathy:  He has no cervical adenopathy.  Neurological: He is alert and oriented to person, place, and time.  Skin: Skin is warm and dry. No rash noted. No erythema.  Psychiatric: He has a normal mood and affect. His behavior is normal.    Lab Results  Component Value Date   HEPBSAB NEG 10/12/2015   No results found for: RPR,  LABRPR  CBC Lab Results  Component Value Date   WBC 7.0 07/14/2013   RBC 5.89 (H) 07/14/2013   HGB 15.4 07/14/2013   HCT 43.6 07/14/2013   PLT 111 (L) 07/14/2013   MCV 74.0 (L) 07/14/2013   MCH 26.1 07/14/2013    MCHC 35.3 07/14/2013   RDW 14.1 07/14/2013   LYMPHSABS 4.4 (H) 03/05/2013   MONOABS 0.6 03/05/2013   EOSABS 0.2 03/05/2013    BMET Lab Results  Component Value Date   NA 138 09/28/2015   K 4.6 09/28/2015   CL 103 09/28/2015   CO2 25 09/28/2015   GLUCOSE 129 (H) 09/28/2015   BUN 18 09/28/2015   CREATININE 1.32 (H) 09/28/2015   CALCIUM 9.4 09/28/2015   GFRNONAA 65 03/16/2015   GFRAA 76 03/16/2015      Assessment and Plan  Chronic hepatitis b = will check labs and plant to restart and will do [place] elastography, for HCC surveillance. Depending on results, may need to reinstitute treatment  rtc in 3 months   Will arrange for appt for ultrasound at end of february

## 2016-04-08 LAB — HEPATITIS B E ANTIBODY: Hepatitis Be Antibody: REACTIVE — AB

## 2016-04-08 LAB — HEPATITIS B DNA, ULTRAQUANTITATIVE, PCR
HEPATITIS B DNA (CALC): 4.31 {Log_IU}/mL — AB (ref <1.30)
Hepatitis B DNA: 20641 IU/mL — ABNORMAL HIGH (ref <20)

## 2016-04-23 ENCOUNTER — Ambulatory Visit (HOSPITAL_COMMUNITY)
Admission: RE | Admit: 2016-04-23 | Discharge: 2016-04-23 | Disposition: A | Discharge status: Home / Self Care | Payer: Medicare Other | Source: Ambulatory Visit | Attending: Internal Medicine | Admitting: Internal Medicine

## 2016-04-23 DIAGNOSIS — B180 Chronic viral hepatitis B with delta-agent: Secondary | ICD-10-CM | POA: Insufficient documentation

## 2016-06-04 ENCOUNTER — Ambulatory Visit: Payer: Self-pay | Admitting: Internal Medicine

## 2016-06-06 ENCOUNTER — Ambulatory Visit (INDEPENDENT_AMBULATORY_CARE_PROVIDER_SITE_OTHER): Payer: Medicare Other | Admitting: Internal Medicine

## 2016-06-06 ENCOUNTER — Encounter: Payer: Self-pay | Admitting: Internal Medicine

## 2016-06-06 VITALS — BP 147/85 | HR 70 | Temp 97.7°F | Wt 135.0 lb

## 2016-06-06 DIAGNOSIS — B181 Chronic viral hepatitis B without delta-agent: Secondary | ICD-10-CM

## 2016-06-06 DIAGNOSIS — R42 Dizziness and giddiness: Secondary | ICD-10-CM | POA: Diagnosis not present

## 2016-06-06 MED ORDER — TENOFOVIR ALAFENAMIDE FUMARATE 25 MG PO TABS: 25.0000 mg | ORAL_TABLET | Freq: Every day | ORAL | 11 refills | Status: DC

## 2016-06-06 MED FILL — VEMLIDY 25 MG TABLET: 25 | 30 days supply | Qty: 30 | Fill #0

## 2016-06-06 NOTE — Progress Notes (Signed)
HPI: Kevin Hutchinson is a 82 y.o. male who is here for his hep B follow up.  Allergies: No Known Allergies  Vitals: Temp: 97.7 F (36.5 C) (04/12 0928) Temp Source: Oral (04/12 0928) BP: 147/85 (04/12 0928) Pulse Rate: 70 (04/12 1610)  Past Medical History: Past Medical History:  Diagnosis Date  . GERD (gastroesophageal reflux disease)    EGD 5-09: : Duodenitis without Hemorrhage, Esophageal Stricture.  . Hepatitis B   . Hyperlipidemia   . PPD positive    Was Rx INH x 3 months (2008)--HD      Social History: Social History   Social History  . Marital status: Married    Spouse name: N/A  . Number of children: 5  . Years of education: N/A   Social History Main Topics  . Smoking status: Former Games developer  . Smokeless tobacco: Never Used     Comment: quit in the 90s  . Alcohol use Yes     Comment: RARE  . Drug use: No  . Sexual activity: Not Asked   Other Topics Concern  . None   Social History Narrative   Lives w/ wife   FROM [place]    Previous Regimen: TDF  Current Regimen: None  Labs: Hep B S Ab (no units)  Date Value  04/04/2016 NEG   Hepatitis B Surface Ag (no units)  Date Value  03/16/2015 POSITIVE (A)   HCV Ab (no units)  Date Value  08/05/2012 NEGATIVE    CrCl: CrCl cannot be calculated (Patient's most recent lab result is older than the maximum 21 days allowed.).  Lipids:    Component Value Date/Time   CHOL 224 (H) 06/24/2012 1115   TRIG 232.0 (H) 06/24/2012 1115   HDL 29.90 (L) 06/24/2012 1115   CHOLHDL 7 06/24/2012 1115   VLDL 46.4 (H) 06/24/2012 1115   LDLCALC 90 09/08/2009 0939    Assessment: Kevin Hutchinson is here for his hep B visit. His VL is >20k now and the elastography has shown F2/3 so Dr. Drue Second will put him back on therapy today. He has Medicare plan and he is in the donut hole right now. I'm going to transfer it to [facility] so we can apply him for the PAF network to get him through the hole.  At this visit, he has had a significant  issue with vertigo. This has been going on for a while. He does have a primary. After a curbside consult with Dr. Modesto Charon, we will not put him on meds but rather get an MRI first. He will be seen by vestibular rehab next week.  Recommendations:  Tx Vemlidy to [facility], PharmD, BCPS, AAHIVP, CPP Clinical Infectious Disease Pharmacist [facility] 06/06/2016, 11:09 AM

## 2016-06-06 NOTE — Progress Notes (Signed)
RFV: chronic hep B  Patient ID: Kevin Hutchinson, male   DOB: November 11, 1934, 82 y.o.   MRN: [id number]  HPI Kevin Hutchinson is a 82yo vietnamese male with chronic hep B. Hep B viral load 20,641. Hep B e ab is reactive, hep B S ab negative. RUQ U/S is negative for any lesions, F2/F3  He states that he has been having spells of dizziness including episode of syncope. Noticeable when he lays down, right side mainly. He does feel that the room is spinning. No vomiting. He generally does not get motion sickness Attempted to do epley's but appears worse on the right  Outpatient Encounter Prescriptions as of 06/06/2016  Medication Sig  . Multiple Vitamins-Minerals (MULTIVITAMIN ADULT PO) Take by mouth.  . [DISCONTINUED] azithromycin (ZITHROMAX Z-PAK) 250 MG tablet 2 tabs a day the first day, then 1 tab a day x 4 days (Patient not taking: Reported on 06/06/2016)  . [DISCONTINUED] guaiFENesin (MUCINEX) 600 MG 12 hr tablet Take 600 mg by mouth daily.   No facility-administered encounter medications on file as of 06/06/2016.      Patient Active Problem List   Diagnosis Date Noted  . PCP NOTES >>>>>>>>>>>>>>. 03/21/2016  . Hearing loss 03/16/2015  . Pain in joint, shoulder region 06/28/2013  . Annual physical exam 01/01/2011  . BPH (benign prostatic hyperplasia) 01/01/2011  . DIZZINESS 05/16/2009  . HYPERLIPIDEMIA 06/15/2007  . Hepatitis B 06/15/2007  . GERD 03/12/2007  . 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 BACK PAIN 02/16/2007     There are no preventive care reminders to display for this patient.  Social History  Substance Use Topics  . Smoking status: Former Games developer  . Smokeless tobacco: Never Used     Comment: quit in the 90s  . Alcohol use Yes     Comment: RARE   Review of Systems +dizziness. Otherwise 12 point ros is negative Physical Exam   BP (!) 147/85   Pulse 70   Temp 97.7 F (36.5 C) (Oral)   Wt 135 lb (61.2 kg)   BMI 25.51 kg/m   Physical Exam  Constitutional: He is oriented to  person, place, and time. He appears well-developed and well-nourished. No distress.  HENT:  Mouth/Throat: Oropharynx is clear and moist. No oropharyngeal exudate.  Cardiovascular: Normal rate, regular rhythm and normal heart sounds. Exam reveals no gallop and no friction rub.  No murmur heard.  Pulmonary/Chest: Effort normal and breath sounds normal. No respiratory distress. He has no wheezes.  Abdominal: Soft. Bowel sounds are normal. He exhibits no distension. There is no tenderness.  Lymphadenopathy:  He has no cervical adenopathy.  Neurological: He is alert and oriented to person, place, and time. + R> L symptomatic dizziness with epley's maneuver Skin: Skin is warm and dry. No rash noted. No erythema.  Psychiatric: He has a normal mood and affect. His behavior is normal.   I have reviewed his record in [place] link  Lab Results  Component Value Date   HEPBSAB NEG 04/04/2016   No results found for: RPR, LABRPR  CBC Lab Results  Component Value Date   WBC 7.4 04/04/2016   RBC 5.96 (H) 04/04/2016   HGB 15.7 04/04/2016   HCT 46.1 04/04/2016   PLT 140 04/04/2016   MCV 77.3 (L) 04/04/2016   MCH 26.3 (L) 04/04/2016   MCHC 34.1 04/04/2016   RDW 14.5 04/04/2016   LYMPHSABS 3,404 04/04/2016   MONOABS 518 04/04/2016   EOSABS 296 04/04/2016    BMET  Lab Results  Component Value Date   NA 139 04/04/2016   K 5.0 04/04/2016   CL 102 04/04/2016   CO2 29 04/04/2016   GLUCOSE 128 (H) 04/04/2016   BUN 12 04/04/2016   CREATININE 0.96 04/04/2016   CALCIUM 9.4 04/04/2016   GFRNONAA 75 04/04/2016   GFRAA 87 04/04/2016      Assessment and Plan  - vertigo probably 2/2 BPPV =Will give maneuvers handout but also refer to neurology and outpatient neuro rehab  Chronic hep b  - will need taf indefinitely or until loss of hep B surface antigen and gain of hep B surface ab  - will refer to vestibular PT at neuro rehab  Chronic hep b will do vemlidy to see if coverage is not  too much oop  Will get brain mri and neuro referral  Spent 45 min with greater than 50% in coordinating care, and counseling on how to do epley's at home for BPPV

## 2016-06-07 ENCOUNTER — Telehealth: Payer: Self-pay | Admitting: Pharmacist Clinician (PhC)/ Clinical Pharmacy Specialist

## 2016-06-07 LAB — COMPREHENSIVE METABOLIC PANEL
ALBUMIN: 4.4 g/dL (ref 3.6–5.1)
ALT: 39 U/L (ref 9–46)
AST: 32 U/L (ref 10–35)
Alkaline Phosphatase: 61 U/L (ref 40–115)
BUN: 15 mg/dL (ref 7–25)
CHLORIDE: 102 mmol/L (ref 98–110)
CO2: 22 mmol/L (ref 20–31)
CREATININE: 1.02 mg/dL (ref 0.70–1.18)
Calcium: 9.1 mg/dL (ref 8.6–10.3)
Glucose, Bld: 166 mg/dL — ABNORMAL HIGH (ref 65–99)
Potassium: 4.6 mmol/L (ref 3.5–5.3)
SODIUM: 137 mmol/L (ref 135–146)
Total Bilirubin: 0.7 mg/dL (ref 0.2–1.2)
Total Protein: 7.5 g/dL (ref 6.1–8.1)

## 2016-06-07 NOTE — Telephone Encounter (Signed)
Transferred his Vemlidy to [employer]. Kathie Rhodes is going to apply him for the PAF to cover the $400 copay. They are going to mail it to him.

## 2016-06-14 ENCOUNTER — Encounter: Payer: Self-pay | Admitting: Physical Therapy

## 2016-06-14 ENCOUNTER — Ambulatory Visit: Payer: Medicare Other | Attending: Internal Medicine | Admitting: Physical Therapy

## 2016-06-14 DIAGNOSIS — R2681 Unsteadiness on feet: Secondary | ICD-10-CM | POA: Diagnosis not present

## 2016-06-14 DIAGNOSIS — R42 Dizziness and giddiness: Secondary | ICD-10-CM | POA: Insufficient documentation

## 2016-06-14 DIAGNOSIS — H8111 Benign paroxysmal vertigo, right ear: Secondary | ICD-10-CM | POA: Diagnosis not present

## 2016-06-14 NOTE — Therapy (Signed)
[facility] [address] [place], [place], [address] Phone: [phone]   Fax:  [phone]  Physical Therapy Evaluation  Patient Details  Name: Kevin Hutchinson MRN: [id number] Date of Birth: [birth year] Referring Provider: Judyann Munson MD  Encounter Date: 06/14/2016      PT End of Session - 06/14/16 0957    Visit Number 1   Number of Visits 5   Date for PT Re-Evaluation 07/14/16   Authorization Type UHC Medicare-G code and progress note every 10th visit   PT Start Time 0900  interpreter arrived late    PT Stop Time 0949   PT Time Calculation (min) 49 min   Activity Tolerance Patient tolerated treatment well   Behavior During Therapy [facility] for tasks assessed/performed      Past Medical History:  Diagnosis Date  . GERD (gastroesophageal reflux disease)    EGD 5-09: : Duodenitis without Hemorrhage, Esophageal Stricture.  . Hepatitis B   . Hyperlipidemia   . PPD positive    Was Rx INH x 3 months (2008)--HD      Past Surgical History:  Procedure Laterality Date  . NO PAST SURGERIES      There were no vitals filed for this visit.       Subjective Assessment - 06/14/16 0904    Subjective Patient is a 82 year old male presenting to PT due to reports of dizziness when lying down particularly to the R side (mild dizziness when turning to L side), which makes him feel like the "room is spinning". Patient is accompanied by his wife and an interpreter. He reports he holds his head stationary to allow the sensation to settle. Patient denies any vision problems, and reports having a hearing impairment for approximately the past 2 years. Patient reports this dizziness has been occuring for approximately 2 weeks. He denies any dizziness prior to 2 weeks ago except for one episode of lightheadedness that is unrelated to his curent dizziness. Patient denies any falls or nausea/vomiting. Patient denies any dizziness with walking  or sitting. Patient denies taking any medication for the dizziness.   Patient is accompained by: Family member;Interpreter  wife   Pertinent History Hep. B, GERD, hyperipidemia    Limitations Other (comment)  lying supine    Patient Stated Goals reduce the dizziness    Currently in Pain? No/denies            [facility] PT Assessment - 06/14/16 9528      Assessment   Medical Diagnosis Vertigo   Referring Provider Judyann Munson MD   Onset Date/Surgical Date 06/06/16  PT referral      Precautions   Precautions Other (comment)   Precaution Comments Hep. B      Balance Screen   Has the patient fallen in the past 6 months No  1 near loss of balance after rolling to get out of bed      Prior Function   Level of Independence Independent     Observation/Other Assessments   Focus on Therapeutic Outcomes (FOTO)  64 (36% limited; predicted 19% limitated)   Other Surveys  Other Surveys   Dizziness Handicap Inventory [facility])  28             Vestibular Assessment - 06/14/16 0001      Symptom Behavior   Type of Dizziness Spinning  especially when lying down    Frequency of Dizziness daily   Duration of Dizziness seconds    Aggravating  Factors Lying supine   Relieving Factors Head stationary     Occulomotor Exam   Occulomotor Alignment Normal   Spontaneous Absent   Gaze-induced Absent   Smooth Pursuits Intact     Vestibulo-Occular Reflex   VOR 1 Head Only (x 1 viewing) Head Impulse Test: positive L, negative R    VOR to Slow Head Movement Normal   VOR Cancellation Normal   Comment Patient is guarded with head motion      Positional Testing   Dix-Hallpike Dix-Hallpike Right;Dix-Hallpike Left     Dix-Hallpike Right   Dix-Hallpike Right Duration <30 seconds    Dix-Hallpike Right Symptoms Upbeat, right rotatory nystagmus     Dix-Hallpike Left   Dix-Hallpike Left Symptoms No nystagmus                Vestibular Treatment/Exercise - 06/14/16 0925       Vestibular Treatment/Exercise   Vestibular Treatment Provided Canalith Repositioning   Canalith Repositioning Epley Manuever Right      EPLEY MANUEVER RIGHT   Number of Reps  2   Overall Response Improved Symptoms   Response Details  felt sense of disequilibrium after second repetition               PT Education - 06/14/16 0955    Education provided Yes   Education Details clinical findings, PT goals and POC, sleeping position on 2 pillows and cease performing home CRM until re-assessed by PT and can be taught correct technique   Person(s) Educated Patient;Spouse;Other (comment)  interpreter   Methods Explanation   Comprehension Verbalized understanding             PT Long Term Goals - 06/14/16 1142      PT LONG TERM GOAL #1   Title Patient will have negative positional BPPV testing to indicate the BPPV has been resolved. (TARGET DATE: 07/12/2016)   Time 4   Period Weeks   Status New     PT LONG TERM GOAL #2   Title Patient will improve DHI score to < or = 18% impaired to indicate an improvement in function. (TARGET DATE: 07/12/2016)   Time 4   Period Weeks   Status New     PT LONG TERM GOAL #3   Title Patient will report no dizziness when rolling or turning his head to indicate resolution of BPPV. (TARGET DATE: 07/12/2016)   Time 4   Period Weeks   Status New     PT LONG TERM GOAL #4   Title Patient will be able to independently demonstrate HEP for self-treatment. (TARGET DATE: 07/12/2016)   Time 4   Period Weeks   Status New               Plan - 06/14/16 1128    Clinical Impression Statement Patient is a 82 year old male presenting to PT due to reports of room spinning vertigo for the past two weeks. The patient is accompanied by his wife and an interpreter. The patient reports the symptoms of dizziness are brought on when rolling in bed or turning his head to the R or L when supine (dizziness when turning R is greater than when turning L). The  patient's oculomotor exam was normal. Small catch up saccade noted in VOR testing to the L indicating a possible L mild hypofunction. PT performed positional testing, which was positive for R posterior BPPV. PT treated with 2 reps of R Epley's maneuver, and patient reported symptoms improved leaving  him with a mild disequlibrium feeling after the second repetition. Patient will benefit from continued skilled PT to reassess and treat as indicated.   Rehab Potential Good   PT Frequency 1x / week   PT Duration 4 weeks   PT Treatment/Interventions ADLs/Self Care Home Management;Canalith Repostioning;Neuromuscular re-education;Balance training;Therapeutic exercise;Therapeutic activities;Functional mobility training;Stair training;Gait training;Patient/family education   PT Next Visit Plan reassess for BPPV and treat as indicated; teach correct sequence for home self-treatment, SVA/DVA to further assess potential L sided hypofunction    Consulted and Agree with Plan of Care Patient;Family member/caregiver   Family Member Consulted Wife       Patient will benefit from skilled therapeutic intervention in order to improve the following deficits and impairments:  Dizziness, Decreased balance  Visit Diagnosis: BPPV (benign paroxysmal positional vertigo), right  Dizziness and giddiness  Unsteadiness on feet      G-Codes - 06/17/2016 0943    Functional Assessment Tool Used (Outpatient Only) DHI:28   Functional Limitation Changing and maintaining body position   Changing and Maintaining Body Position Current Status (Z6109) At least 20 percent but less than 40 percent impaired, limited or restricted   Changing and Maintaining Body Position Goal Status (U0454) 0 percent impaired, limited or restricted       Problem List Patient Active Problem List   Diagnosis Date Noted  . PCP NOTES >>>>>>>>>>>>>>. 03/21/2016  . Hearing loss 03/16/2015  . Pain in joint, shoulder region 06/28/2013  . Annual  physical exam 01/01/2011  . BPH (benign prostatic hyperplasia) 01/01/2011  . DIZZINESS 05/16/2009  . HYPERLIPIDEMIA 2007/06/18  . Hepatitis B 06/18/07  . GERD 03/12/2007  . 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 BACK PAIN 02/16/2007    Chase Picket, SPT 06-17-2016, 1:52 PM  [facility] [address] [place], [place], [address] Phone: [phone]   Fax:  [phone]  Name: JAYDEEN ODOR MRN: [id number] Date of Birth: 02-18-1935

## 2016-06-18 ENCOUNTER — Telehealth: Payer: Self-pay | Admitting: *Deleted

## 2016-06-18 NOTE — Telephone Encounter (Signed)
PA not required for MRI.  [facility] has not yet been able to reach the patient. RN spoke with [facility] who stated that the patient's Jatniel makes all of the patient's appointments. GI will call patient's Alyx. Andree Coss, RN

## 2016-06-21 ENCOUNTER — Ambulatory Visit: Payer: Medicare Other | Admitting: Physical Therapy

## 2016-06-21 ENCOUNTER — Encounter: Payer: Self-pay | Admitting: Physical Therapy

## 2016-06-21 DIAGNOSIS — R42 Dizziness and giddiness: Secondary | ICD-10-CM | POA: Diagnosis not present

## 2016-06-21 DIAGNOSIS — H8111 Benign paroxysmal vertigo, right ear: Secondary | ICD-10-CM

## 2016-06-21 DIAGNOSIS — R2681 Unsteadiness on feet: Secondary | ICD-10-CM

## 2016-06-21 NOTE — Therapy (Signed)
[facility] [address] [place], [place], [address] Phone: [phone]   Fax:  [phone]  Physical Therapy Treatment  Patient Details  Name: Kevin Hutchinson MRN: [id number] Date of Birth: [birth year] Referring Provider: Judyann Munson MD  Encounter Date: 06/21/2016      PT End of Session - 06/21/16 1634    Visit Number 2   Number of Visits 5   Date for PT Re-Evaluation 07/14/16   Authorization Type UHC Medicare-G code and progress note every 10th visit   PT Start Time 0848   PT Stop Time 0928   PT Time Calculation (min) 40 min   Activity Tolerance Patient tolerated treatment well   Behavior During Therapy [facility] for tasks assessed/performed      Past Medical History:  Diagnosis Date  . GERD (gastroesophageal reflux disease)    EGD 5-09: : Duodenitis without Hemorrhage, Esophageal Stricture.  . Hepatitis B   . Hyperlipidemia   . PPD positive    Was Rx INH x 3 months (2008)--HD      Past Surgical History:  Procedure Laterality Date  . NO PAST SURGERIES      There were no vitals filed for this visit.      Subjective Assessment - 06/21/16 0853    Subjective Feels better, but still having some dizziness. Sit to stand, supine to sit. Head feels heavy, tired. Lasts just a few seconds.   Patient is accompained by: Family member;Interpreter  wife   Pertinent History Hep. B, GERD, hyperipidemia    Limitations Other (comment)  lying supine    Patient Stated Goals reduce the dizziness    Currently in Pain? No/denies                Vestibular Assessment - 06/21/16 0001      Symptom Behavior   Type of Dizziness Imbalance   Frequency of Dizziness daily   Duration of Dizziness seconds      Positional Testing   Dix-Hallpike Dix-Hallpike Right     Dix-Hallpike Right   Dix-Hallpike Right Duration <30 sec   Dix-Hallpike Right Symptoms Downbeat, right rotatory nystagmus                 [facility]  Adult PT Treatment/Exercise - 06/21/16 0001      Bed Mobility   Bed Mobility Supine to Sit;Sit to Supine   Supine to Sit 7: Independent   Sit to Supine 7: Independent     Transfers   Transfers Sit to Stand;Stand to Sit   Sit to Stand 7: Independent   Stand to Sit 7: Independent   Comments denied symptoms after Epley     Ambulation/Gait   Ambulation/Gait Yes   Ambulation/Gait Assistance 7: Independent   Ambulation Distance (Feet) 50 Feet   Assistive device None   Gait Pattern Within Functional Limits   Ambulation Surface Level;Indoor   Gait Comments with head turns and no imbalance, symptoms         Vestibular Treatment/Exercise - 06/21/16 0924      Vestibular Treatment/Exercise   Vestibular Treatment Provided Canalith Repositioning   Canalith Repositioning Epley Manuever Right   Habituation Exercises [place]      [facility] MANUEVER RIGHT   Number of Reps  2   Overall Response Symptoms Resolved     [place]   Number of Reps  2   Symptom Description  none  PT Education - 06/21/16 1632    Education provided Yes   Education Details Brandt-Daroff exercises (5 reps, 3 times/day; if no symptoms 2 consecutive days may stop)   Person(s) Educated Patient;Spouse   Methods Explanation;Demonstration;Tactile cues;Verbal cues;Handout;Other (comment)  interpreter also added written instructions on handout   Comprehension Verbalized understanding;Returned demonstration             PT Long Term Goals - 06/14/16 1142      PT LONG TERM GOAL #1   Title Patient will have negative positional BPPV testing to indicate the BPPV has been resolved. (TARGET DATE: 07/12/2016)   Time 4   Period Weeks   Status New     PT LONG TERM GOAL #2   Title Patient will improve DHI score to < or = 18% impaired to indicate an improvement in function. (TARGET DATE: 07/12/2016)   Time 4   Period Weeks   Status New     PT LONG TERM GOAL #3   Title Patient will report  no dizziness when rolling or turning his head to indicate resolution of BPPV. (TARGET DATE: 07/12/2016)   Time 4   Period Weeks   Status New     PT LONG TERM GOAL #4   Title Patient will be able to independently demonstrate HEP for self-treatment. (TARGET DATE: 07/12/2016)   Time 4   Period Weeks   Status New               Plan - 06/21/16 1635    Clinical Impression Statement Patient reporting some continued dizziness/imbalance since prior session. +Rt Dix-Hallpike however with downbeating, rt rotational nystagmus. Epley maneuver x 2 with complete resolution of symptoms. Educated in Brandt-Daroff exercises. Instructed to call and cancel future appointments if he has no further symptoms.    Rehab Potential Good   PT Frequency 1x / week   PT Duration 4 weeks   PT Treatment/Interventions ADLs/Self Care Home Management;Canalith Repostioning;Neuromuscular re-education;Balance training;Therapeutic exercise;Therapeutic activities;Functional mobility training;Stair training;Gait training;Patient/family education   PT Next Visit Plan reassess for BPPV and treat as indicated; SVA/DVA to further assess potential L sided hypofunction    Consulted and Agree with Plan of Care Patient;Family member/caregiver   Family Member Consulted Wife       Patient will benefit from skilled therapeutic intervention in order to improve the following deficits and impairments:  Dizziness, Decreased balance  Visit Diagnosis: BPPV (benign paroxysmal positional vertigo), right  Dizziness and giddiness  Unsteadiness on feet     Problem List Patient Active Problem List   Diagnosis Date Noted  . PCP NOTES >>>>>>>>>>>>>>. 03/21/2016  . Hearing loss 03/16/2015  . Pain in joint, shoulder region 06/28/2013  . Annual physical exam 01/01/2011  . BPH (benign prostatic hyperplasia) 01/01/2011  . DIZZINESS 05/16/2009  . HYPERLIPIDEMIA 06/15/2007  . Hepatitis B 06/15/2007  . GERD 03/12/2007  . PPD positive  03/12/2007  . BACK PAIN 02/16/2007    Zena Amos, PT 06/21/2016, 4:41 PM  [facility] [address] [place], [place], [address] Phone: [phone]   Fax:  [phone]  Name: KELCY BAETEN MRN: [id number] Date of Birth: [birth year]

## 2016-06-24 DIAGNOSIS — H8111 Benign paroxysmal vertigo, right ear: Secondary | ICD-10-CM | POA: Diagnosis not present

## 2016-06-24 DIAGNOSIS — R2681 Unsteadiness on feet: Secondary | ICD-10-CM | POA: Diagnosis not present

## 2016-06-24 DIAGNOSIS — R42 Dizziness and giddiness: Secondary | ICD-10-CM | POA: Diagnosis not present

## 2016-06-26 ENCOUNTER — Encounter: Payer: Self-pay | Admitting: Physical Therapy

## 2016-06-26 NOTE — Therapy (Signed)
[facility] [address] [place], [place], [address] Phone: [phone]   Fax:  [phone]  Patient Details  Name: Kevin Hutchinson MRN: [id number] Date of Birth: 01/03/1935 Referring Provider:  No ref. provider found  Encounter Date: 07/21/2016 PHYSICAL THERAPY DISCHARGE SUMMARY  Visits from Start of Care: 2  Current functional level related to goals / functional outcomes: Continued to report positional vertigo at second visit; continued to treat with CRM and habituation exercises.  Pt requested to cancel remaining visits due to co-pay.  Attempted to contact pt to provide information about [facility] physical therapy clinics.  Will attempt to contact again.   Remaining deficits: Dizziness, imbalance   Education / Equipment: HEP  Plan: Patient agrees to discharge.  Patient goals were not met. Patient is being discharged due to the patient's request.  ?????         G-Codes - 07/21/16 0908    Functional Assessment Tool Used (Outpatient Only) Clinical judgment as pt did not return for further visits   Functional Limitation Changing and maintaining body position   Changing and Maintaining Body Position Goal Status (D5329) 0 percent impaired, limited or restricted   Changing and Maintaining Body Position Discharge Status (J2426) At least 1 percent but less than 20 percent impaired, limited or restricted      Raylene Everts, PT, DPT 07-21-16    9:14 AM    [facility] [address] [place], [place], [address] Phone: [phone]   Fax:  [phone]

## 2016-06-28 ENCOUNTER — Other Ambulatory Visit: Payer: Self-pay | Admitting: Internal Medicine

## 2016-06-28 ENCOUNTER — Inpatient Hospital Stay: Admission: RE | Admit: 2016-06-28 | Payer: Self-pay | Source: Ambulatory Visit

## 2016-07-04 MED FILL — VEMLIDY 25 MG TABLET: 25 | 30 days supply | Qty: 30 | Fill #1

## 2016-07-05 ENCOUNTER — Encounter: Payer: Self-pay | Admitting: Physical Therapy

## 2016-07-12 ENCOUNTER — Encounter: Payer: Self-pay | Admitting: Physical Therapy

## 2016-07-17 ENCOUNTER — Telehealth: Payer: Self-pay | Admitting: *Deleted

## 2016-07-17 NOTE — Telephone Encounter (Signed)
-----   Message from Judyann Munsonynthia Snider, MD sent at 07/17/2016  1:47 PM EDT ----- Regarding: RE: mri If symptoms have resolved. No need for mri. Also I thought we were referring him to neurology for evaluation and planned to get the mri to help with his work up. If symptoms are better -- no need ----- Message ----- From: Andree CossHowell, Michelle M, RN Sent: 07/16/2016   5:08 PM To: Judyann Munsonynthia Snider, MD Subject: mri                                            Patient[facility]  Per GI, the patient's Sekou arrived on 5/4 and said the MRI was for him, not our patient.  GI will call patient today (via interpreter) to schedule MRI. Please advise if you still want this MRI completed. It seems his symptoms have resolved.   Thanks!  Marcelino DusterMichelle

## 2016-07-17 NOTE — Telephone Encounter (Signed)
Referral and image order cancelled per Dr Drue SecondSnider. Andree CossHowell, Velvie Thomaston M, RN

## 2016-07-31 MED FILL — VEMLIDY 25 MG TABLET: 25 | 30 days supply | Qty: 30 | Fill #2

## 2016-08-06 ENCOUNTER — Ambulatory Visit: Payer: Self-pay | Admitting: Internal Medicine

## 2016-08-27 ENCOUNTER — Encounter: Payer: Self-pay | Admitting: Internal Medicine

## 2016-08-27 ENCOUNTER — Ambulatory Visit (INDEPENDENT_AMBULATORY_CARE_PROVIDER_SITE_OTHER): Payer: Medicare Other | Admitting: Internal Medicine

## 2016-08-27 VITALS — BP 129/78 | HR 82 | Temp 98.6°F | Wt 137.0 lb

## 2016-08-27 DIAGNOSIS — N182 Chronic kidney disease, stage 2 (mild): Secondary | ICD-10-CM

## 2016-08-27 DIAGNOSIS — R42 Dizziness and giddiness: Secondary | ICD-10-CM

## 2016-08-27 DIAGNOSIS — B180 Chronic viral hepatitis B with delta-agent: Secondary | ICD-10-CM

## 2016-08-27 DIAGNOSIS — R5383 Other fatigue: Secondary | ICD-10-CM

## 2016-08-27 LAB — COMPLETE METABOLIC PANEL WITH GFR
ALBUMIN: 4.5 g/dL (ref 3.6–5.1)
ALK PHOS: 58 U/L (ref 40–115)
ALT: 19 U/L (ref 9–46)
AST: 19 U/L (ref 10–35)
BUN: 22 mg/dL (ref 7–25)
CALCIUM: 9.5 mg/dL (ref 8.6–10.3)
CHLORIDE: 100 mmol/L (ref 98–110)
CO2: 28 mmol/L (ref 20–31)
CREATININE: 1.2 mg/dL — AB (ref 0.70–1.11)
GFR, EST AFRICAN AMERICAN: 66 mL/min (ref >=60)
GFR, Est Non African American: 57 mL/min — ABNORMAL LOW (ref >=60)
Glucose, Bld: 111 mg/dL — ABNORMAL HIGH (ref 65–99)
Potassium: 4.9 mmol/L (ref 3.5–5.3)
Sodium: 139 mmol/L (ref 135–146)
Total Bilirubin: 0.8 mg/dL (ref 0.2–1.2)
Total Protein: 7.6 g/dL (ref 6.1–8.1)

## 2016-08-27 LAB — CBC WITH DIFFERENTIAL/PLATELET
BASOS PCT: 0 %
Basophils Absolute: 0 cells/uL (ref 0–200)
Eosinophils Absolute: 380 cells/uL (ref 15–500)
Eosinophils Relative: 4 %
HEMATOCRIT: 47.2 % (ref 38.5–50.0)
HEMOGLOBIN: 15.9 g/dL (ref 13.2–17.1)
LYMPHS PCT: 46 %
Lymphs Abs: 4370 cells/uL — ABNORMAL HIGH (ref 850–3900)
MCH: 25.9 pg — ABNORMAL LOW (ref 27.0–33.0)
MCHC: 33.7 g/dL (ref 32.0–36.0)
MCV: 76.9 fL — ABNORMAL LOW (ref 80.0–100.0)
MONO ABS: 665 {cells}/uL (ref 200–950)
MPV: 11.2 fL (ref 7.5–12.5)
Monocytes Relative: 7 %
NEUTROS PCT: 43 %
Neutro Abs: 4085 cells/uL (ref 1500–7800)
Platelets: 131 10*3/uL — ABNORMAL LOW (ref 140–400)
RBC: 6.14 MIL/uL — ABNORMAL HIGH (ref 4.20–5.80)
RDW: 15.1 % — AB (ref 11.0–15.0)
WBC: 9.5 10*3/uL (ref 3.8–10.8)

## 2016-08-27 LAB — TSH: TSH: 0.88 mIU/L (ref 0.40–4.50)

## 2016-08-27 LAB — T4, FREE: Free T4: 1.1 ng/dL (ref 0.8–1.8)

## 2016-08-29 NOTE — Progress Notes (Signed)
RFV: follow up on chronic hepatitis b Patient ID: Kevin Hutchinson, male   DOB: May 16, 1934, 82 y.o.   MRN: [id number]  HPI Kevin Hutchinson is an 82yo M with history of chronic hepatitis b restarted on treatment, with vemlidy. At our last visit, he was suffering from benign position vertigo. He states that his symptoms improved, does not look like he went to neurology for follow up. He states he is doing well, though sometimes fatigued. No episodes n/v/jaundice, nor any abdominal pain.  Social History  Substance Use Topics  . Smoking status: Former Games developer  . Smokeless tobacco: Never Used     Comment: quit in the 90s  . Alcohol use Yes     Comment: RARE    Outpatient Encounter Prescriptions as of 08/27/2016  Medication Sig  . Multiple Vitamins-Minerals (MULTIVITAMIN ADULT PO) Take by mouth.  . Tenofovir Alafenamide Fumarate 25 MG TABS Take 1 tablet (25 mg total) by mouth daily.   No facility-administered encounter medications on file as of 08/27/2016.      Patient Active Problem List   Diagnosis Date Noted  . PCP NOTES >>>>>>>>>>>>>>. 03/21/2016  . Hearing loss 03/16/2015  . Pain in joint, shoulder region 06/28/2013  . Annual physical exam 01/01/2011  . BPH (benign prostatic hyperplasia) 01/01/2011  . DIZZINESS 05/16/2009  . HYPERLIPIDEMIA 06/15/2007  . Hepatitis B 06/15/2007  . GERD 03/12/2007  . 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 BACK PAIN 02/16/2007     There are no preventive care reminders to display for this patient.   Review of Systems +fatigue buth otherwise 12 point ros is negative Physical Exam   BP 129/78   Pulse 82   Temp 98.6 F (37 C) (Oral)   Wt 137 lb (62.1 kg)   BMI 25.89 kg/m    Physical Exam  Constitutional:  oriented to person, place, and time. appears well-developed and well-nourished. No distress.  HENT: [place]/AT, PERRLA, no scleral icterus Mouth/Throat: Oropharynx is clear and moist. No oropharyngeal exudate.  Cardiovascular: Normal rate, regular rhythm and  normal heart sounds. Exam reveals no gallop and no friction rub.  No murmur heard.  Pulmonary/Chest: Effort normal and breath sounds normal. No respiratory distress.  has no wheezes.  Neck = supple, no nuchal rigidity Abdominal: Soft. Bowel sounds are normal.  exhibits no distension. There is no tenderness.  Lymphadenopathy: no cervical adenopathy. No axillary adenopathy Neurological: alert and oriented to person, place, and time.  Skin: Skin is warm and dry. No rash noted. No erythema.  Psychiatric: a normal mood and affect.  behavior is normal.   Lab Results  Component Value Date   HEPBSAB NEG 04/04/2016   No results found for: RPR, LABRPR  CBC Lab Results  Component Value Date   WBC 9.5 08/27/2016   RBC 6.14 (H) 08/27/2016   HGB 15.9 08/27/2016   HCT 47.2 08/27/2016   PLT 131 (L) 08/27/2016   MCV 76.9 (L) 08/27/2016   MCH 25.9 (L) 08/27/2016   MCHC 33.7 08/27/2016   RDW 15.1 (H) 08/27/2016   LYMPHSABS 4,370 (H) 08/27/2016   MONOABS 665 08/27/2016   EOSABS 380 08/27/2016    BMET Lab Results  Component Value Date   NA 139 08/27/2016   K 4.9 08/27/2016   CL 100 08/27/2016   CO2 28 08/27/2016   GLUCOSE 111 (H) 08/27/2016   BUN 22 08/27/2016   CREATININE 1.20 (H) 08/27/2016   CALCIUM 9.5 08/27/2016   GFRNONAA 57 (L) 08/27/2016   GFRAA 66 08/27/2016  Assessment and Plan  Chronic hepatitis b = will check hepatitis b viral load and cmp. Plan to continue on vemlidy (TAF) for several years. 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 Reminded them to use eply maneuvers

## 2016-08-30 LAB — HEPATITIS B DNA, ULTRAQUANTITATIVE, PCR: Hepatitis B DNA (Calc): <1 Log IU/mL

## 2016-09-02 MED FILL — VEMLIDY 25 MG TABLET: 25 | 30 days supply | Qty: 30 | Fill #3

## 2016-09-26 MED FILL — VEMLIDY 25 MG TABLET: 25 | 30 days supply | Qty: 30 | Fill #4

## 2016-10-29 MED FILL — VEMLIDY 25 MG TABLET: 25 | 30 days supply | Qty: 30 | Fill #5

## 2016-11-22 MED FILL — VEMLIDY 25 MG TABLET: 25 | 30 days supply | Qty: 30 | Fill #6

## 2016-12-03 ENCOUNTER — Ambulatory Visit (INDEPENDENT_AMBULATORY_CARE_PROVIDER_SITE_OTHER): Payer: Medicare Other | Admitting: Internal Medicine

## 2016-12-03 ENCOUNTER — Encounter: Payer: Self-pay | Admitting: Internal Medicine

## 2016-12-03 VITALS — BP 118/78 | HR 71 | Temp 98.2°F | Resp 14 | Ht 61.0 in | Wt 137.4 lb

## 2016-12-03 DIAGNOSIS — Z125 Encounter for screening for malignant neoplasm of prostate: Secondary | ICD-10-CM

## 2016-12-03 DIAGNOSIS — Z23 Encounter for immunization: Secondary | ICD-10-CM

## 2016-12-03 DIAGNOSIS — Z136 Encounter for screening for cardiovascular disorders: Secondary | ICD-10-CM | POA: Diagnosis not present

## 2016-12-03 DIAGNOSIS — Z0000 Encounter for general adult medical examination without abnormal findings: Secondary | ICD-10-CM

## 2016-12-03 LAB — LIPID PANEL
Cholesterol: 177 mg/dL (ref 0–200)
HDL: 28.7 mg/dL — ABNORMAL LOW (ref >39.00)
Total CHOL/HDL Ratio: 6
Triglycerides: 454 mg/dL — ABNORMAL HIGH (ref 0.0–149.0)

## 2016-12-03 LAB — LDL CHOLESTEROL, DIRECT: Direct LDL: 91 mg/dL

## 2016-12-03 LAB — PSA: PSA: 5.43 ng/mL — ABNORMAL HIGH (ref 0.10–4.00)

## 2016-12-03 MED ORDER — TAMSULOSIN HCL 0.4 MG PO CAPS: 0.4000 mg | ORAL_CAPSULE | Freq: Every day | ORAL | 6 refills | Status: DC

## 2016-12-03 MED ORDER — ZOSTER VAC RECOMB ADJUVANTED 50 MCG/0.5ML IM SUSR: 0.5000 mL | Freq: Once | INTRAMUSCULAR | 1 refills | Status: AC

## 2016-12-03 MED ORDER — PANTOPRAZOLE SODIUM 40 MG PO TBEC: 40.0000 mg | DELAYED_RELEASE_TABLET | Freq: Every day | ORAL | 6 refills | Status: DC

## 2016-12-03 NOTE — Assessment & Plan Note (Addendum)
-   Td--2011;pneumonia shot 2009; prevnar 2015; had zostavax before . Flu shot today. Shingrix discussed, Rx printed.  -CCS: Cscope 5-09 @ [facility]: TICs,  Next per GI -Prostate cancer screening: see BPH Diet-exercise discussed  -Labs reviewed, will do a PSA and FLP

## 2016-12-03 NOTE — Progress Notes (Signed)
Subjective:    Patient ID: Kevin Hutchinson, male    DOB: Feb 12, 1935, 82 y.o.   MRN: [id number]  DOS:  12/03/2016 Type of visit - description :CPX  Interval history: Here with a translator which was extremely  helpful   Review of Systems In general feels well. He did complain of heartburn, described as a burning at the upper abdomen and lower chest, this is going on for 2 years, only at night. No exertional symptoms Denies nausea, vomiting, diarrhea blood in the stools. He does have occasional dysphagia to solids, not to liquids. This has been stable over the last few years. History of BPH, has chronic difficulty urinating, at baseline. No dysuria or gross hematuria. Asked if he feels he needed medication for his symptoms and he said yes.   Other than above, a 14 point review of systems is negative     Past Medical History:  Diagnosis Date  . GERD (gastroesophageal reflux disease)    EGD 5-09: : Duodenitis without Hemorrhage, Esophageal Stricture.  . Hepatitis B   . Hyperlipidemia   . PPD positive    Was Rx INH x 3 months (2008)--HD      Past Surgical History:  Procedure Laterality Date  . NO PAST SURGERIES      Social History   Social History  . Marital status: Married    Spouse name: N/A  . Number of children: 5  . Years of education: N/A   Occupational History  . Not on file.   Social History Main Topics  . Smoking status: Former Games developer  . Smokeless tobacco: Never Used     Comment: quit in the 90s  . Alcohol use Yes     Comment: RARE  . Drug use: No  . Sexual activity: Not on file   Other Topics Concern  . Not on file   Social History Narrative   Lives w/ wife   FROM [place]    No family history on file.   Allergies as of 12/03/2016   No Known Allergies     Medication List       Accurate as of 12/03/16  9:25 AM. Always use your most recent med list.          MULTIVITAMIN ADULT PO Take by mouth.   Tenofovir Alafenamide Fumarate 25 MG  Tabs Take 1 tablet (25 mg total) by mouth daily.          Objective:   Physical Exam BP 118/78 (BP Location: Left Arm, Patient Position: Sitting, Cuff Size: Small)   Pulse 71   Temp 98.2 F (36.8 C) (Oral)   Resp 14   Ht  (1.549 m)   Wt 137 lb 6 oz (62.3 kg)   SpO2 96%   BMI 25.96 kg/m   General:   Well developed, well nourished . NAD.  Neck: No  thyromegaly  HEENT:  Normocephalic . Face symmetric, atraumatic Lungs:  CTA B Normal respiratory effort, no intercostal retractions, no accessory muscle use. Heart: RRR,  no murmur.  No pretibial edema bilaterally  Abdomen:  Not distended, soft, non-tender. No rebound or rigidity.   Skin: Exposed areas without rash. Not pale. Not jaundice  DRE: Normal sphincter tone, no stools found, prostate is not nodular or tender, symmetric. Not large. Neurologic:  alert & oriented X3.  Speech normal, gait appropriate for age and unassisted Strength symmetric and appropriate for age.  Psych: Cognition and judgment appear intact.  Cooperative with normal attention span  and concentration.  Behavior appropriate. No anxious or depressed appearing.    Assessment & Plan:   Assessment Anemia GERD: EGD 06-2007: duodenitis, esophageal stricture BPH, slightly increased PSA, declined to see urology 2015 Chronic hepatitis B H/o + PPD, INH 3 months 2008 HOH, s/p hearing assessment 11/30/2014: Severe hearing loss, has hearing aids   Plan: GERD: Ongoing but stable sxs, h/o  esophageal stricture. Rx  Protonix. Reassess in 6 months BPH: DRE without red flag symptoms, check a PSA. Trial with Flomax, it helped before per chart review. Chronic hepatitis: per ID. RTC 6 months

## 2016-12-03 NOTE — Patient Instructions (Signed)
GO TO THE LAB : Get the blood work     GO TO THE FRONT DESK Schedule your next appointment for a  checkup in 6 months  Start pantoprazole one before breakfast to help with the  burning in the stomach  Start Flomax one at bedtime to help with your difficulty urinating   Okay to you take a shingles immunization [facility]

## 2016-12-03 NOTE — Progress Notes (Signed)
Pre visit review using our clinic review tool, if applicable. No additional management support is needed unless otherwise documented below in the visit note. 

## 2016-12-04 NOTE — Assessment & Plan Note (Signed)
GERD: Ongoing but stable sxs, h/o  esophageal stricture. Rx  Protonix. Reassess in 6 months BPH: DRE without red flag symptoms, check a PSA. Trial with Flomax, it helped before per chart review. Chronic hepatitis: per ID. RTC 6 months

## 2016-12-25 MED FILL — VEMLIDY 25 MG TABLET: 25 | 30 days supply | Qty: 30 | Fill #7

## 2017-01-17 MED FILL — VEMLIDY 25 MG TABLET: 25 | 30 days supply | Qty: 30 | Fill #8

## 2017-01-24 DIAGNOSIS — H04123 Dry eye syndrome of bilateral lacrimal glands: Secondary | ICD-10-CM | POA: Diagnosis not present

## 2017-01-24 DIAGNOSIS — H40033 Anatomical narrow angle, bilateral: Secondary | ICD-10-CM | POA: Diagnosis not present

## 2017-02-13 ENCOUNTER — Other Ambulatory Visit: Payer: Medicare Other

## 2017-02-13 MED FILL — VEMLIDY 25 MG TABLET: 25 | 30 days supply | Qty: 30 | Fill #9

## 2017-02-27 ENCOUNTER — Encounter: Payer: Self-pay | Admitting: Internal Medicine

## 2017-02-27 ENCOUNTER — Ambulatory Visit (INDEPENDENT_AMBULATORY_CARE_PROVIDER_SITE_OTHER): Payer: Medicare Other | Admitting: Internal Medicine

## 2017-02-27 VITALS — BP 139/71 | HR 74 | Wt 139.0 lb

## 2017-02-27 DIAGNOSIS — B181 Chronic viral hepatitis B without delta-agent: Secondary | ICD-10-CM

## 2017-02-27 NOTE — Progress Notes (Signed)
RFV: chronic hepatitis B  Patient ID: Kevin Hutchinson T Varden, male   DOB: May 21, 1934, 83 y.o.   MRN: [id number]  HPI [name] is a pleasant 83yo vietnamese male, with limited english, who has chronic hepatitis C. Otherwise in very good health. Since we last saw him, he has been in good health. Denies any issues troubling him at this time.  Outpatient Encounter Medications as of 02/27/2017  Medication Sig  . Multiple Vitamins-Minerals (MULTIVITAMIN ADULT PO) Take by mouth.  . pantoprazole (PROTONIX) 40 MG tablet Take 1 tablet (40 mg total) by mouth daily.  . tamsulosin (FLOMAX) 0.4 MG CAPS capsule Take 1 capsule (0.4 mg total) by mouth daily after supper.  . Tenofovir Alafenamide Fumarate 25 MG TABS Take 1 tablet (25 mg total) by mouth daily.   No facility-administered encounter medications on file as of 02/27/2017.      Patient Active Problem List   Diagnosis Date Noted  . PCP NOTES >>>>>>>>>>>>>>. 03/21/2016  . Hearing loss 03/16/2015  . Pain in joint, shoulder region 06/28/2013  . Annual physical exam 01/01/2011  . BPH (benign prostatic hyperplasia) 01/01/2011  . DIZZINESS 05/16/2009  . HYPERLIPIDEMIA 06/15/2007  . Hepatitis B 06/15/2007  . GERD 03/12/2007  . 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 BACK PAIN 02/16/2007   Soc hx: he does not smoke or drink alcohol  There are no preventive care reminders to display for this patient.   Review of Systems Review of Systems  Constitutional: Negative for fever, chills, diaphoresis, activity change, appetite change, fatigue and unexpected weight change.  HENT: Negative for congestion, sore throat, rhinorrhea, sneezing, trouble swallowing and sinus pressure.  Eyes: Negative for photophobia and visual disturbance.  Respiratory: Negative for cough, chest tightness, shortness of breath, wheezing and stridor.  Cardiovascular: Negative for chest pain, palpitations and leg swelling.  Gastrointestinal: Negative for nausea, vomiting, abdominal pain, diarrhea,  constipation, blood in stool, abdominal distention and anal bleeding.  Genitourinary: Negative for dysuria, hematuria, flank pain and difficulty urinating.  Musculoskeletal: Negative for myalgias, back pain, joint swelling, arthralgias and gait problem.  Skin: Negative for color change, pallor, rash and wound.  Neurological: Negative for dizziness, tremors, weakness and light-headedness.  Hematological: Negative for adenopathy. Does not bruise/bleed easily.  Psychiatric/Behavioral: Negative for behavioral problems, confusion, sleep disturbance, dysphoric mood, decreased concentration and agitation.    Physical Exam   BP 139/71   Pulse 74   Wt 139 lb (63 kg)   BMI 26.26 kg/m    Physical Exam  Constitutional: He is oriented to person, place, and time. He appears well-developed and well-nourished. No distress.  HENT:  Mouth/Throat: Oropharynx is clear and moist. No oropharyngeal exudate.  Cardiovascular: Normal rate, regular rhythm and normal heart sounds. Exam reveals no gallop and no friction rub.  No murmur heard.  Pulmonary/Chest: Effort normal and breath sounds normal. No respiratory distress. He has no wheezes.  Abdominal: Soft. Bowel sounds are normal. He exhibits no distension. There is no tenderness.  Lymphadenopathy:  He has no cervical adenopathy.  Neurological: He is alert and oriented to person, place, and time.  Skin: Skin is warm and dry. No rash noted. No erythema.  Psychiatric: He has a normal mood and affect. His behavior is normal.    Lab Results  Component Value Date   HEPBSAB NEG 04/04/2016   No results found for: RPR, LABRPR  CBC Lab Results  Component Value Date   WBC 9.5 08/27/2016   RBC 6.14 (H) 08/27/2016   HGB 15.9 08/27/2016  HCT 47.2 08/27/2016   PLT 131 (L) 08/27/2016   MCV 76.9 (L) 08/27/2016   MCH 25.9 (L) 08/27/2016   MCHC 33.7 08/27/2016   RDW 15.1 (H) 08/27/2016   LYMPHSABS 4,370 (H) 08/27/2016   MONOABS 665 08/27/2016   EOSABS 380  08/27/2016    BMET Lab Results  Component Value Date   NA 139 08/27/2016   K 4.9 08/27/2016   CL 100 08/27/2016   CO2 28 08/27/2016   GLUCOSE 111 (H) 08/27/2016   BUN 22 08/27/2016   CREATININE 1.20 (H) 08/27/2016   CALCIUM 9.5 08/27/2016   GFRNONAA 57 (L) 08/27/2016   GFRAA 66 08/27/2016      Assessment and Plan Chronic hepatitis B = Will do lab work for following chronic hep b Will need to repeat US in March-April to screen for [facility] Will reapply for his vimlidy in April  Gave precautions on tylenol use

## 2017-02-28 LAB — CBC WITH DIFFERENTIAL/PLATELET
BASOS ABS: 68 {cells}/uL (ref 0–200)
Basophils Relative: 0.8 %
Eosinophils Absolute: 238 cells/uL (ref 15–500)
Eosinophils Relative: 2.8 %
HEMATOCRIT: 43 % (ref 38.5–50.0)
HEMOGLOBIN: 14.7 g/dL (ref 13.2–17.1)
LYMPHS ABS: 3681 {cells}/uL (ref 850–3900)
MCH: 26 pg — AB (ref 27.0–33.0)
MCHC: 34.2 g/dL (ref 32.0–36.0)
MCV: 76.1 fL — ABNORMAL LOW (ref 80.0–100.0)
MPV: 11.5 fL (ref 7.5–12.5)
Monocytes Relative: 6.5 %
NEUTROS ABS: 3961 {cells}/uL (ref 1500–7800)
NEUTROS PCT: 46.6 %
Platelets: 139 10*3/uL — ABNORMAL LOW (ref 140–400)
RBC: 5.65 10*6/uL (ref 4.20–5.80)
RDW: 14.1 % (ref 11.0–15.0)
Total Lymphocyte: 43.3 %
WBC mixed population: 553 cells/uL (ref 200–950)
WBC: 8.5 10*3/uL (ref 3.8–10.8)

## 2017-02-28 LAB — HEPATITIS B SURFACE ANTIGEN: HEP B S AG: REACTIVE — AB

## 2017-02-28 LAB — HEPATITIS B SURFACE ANTIBODY,QUALITATIVE: HEP B S AB: NONREACTIVE

## 2017-03-02 LAB — HEPATITIS B DNA, ULTRAQUANTITATIVE, PCR
Hepatitis B DNA (Calc): <1 Log IU/mL
Hepatitis B DNA: <10 IU/mL

## 2017-03-19 MED FILL — VEMLIDY 25 MG TABLET: 25 | 30 days supply | Qty: 30 | Fill #10

## 2017-04-15 MED FILL — VEMLIDY 25 MG TABLET: 25 | 30 days supply | Qty: 30 | Fill #11

## 2017-05-08 ENCOUNTER — Other Ambulatory Visit: Payer: Self-pay | Admitting: Internal Medicine

## 2017-05-08 DIAGNOSIS — B181 Chronic viral hepatitis B without delta-agent: Secondary | ICD-10-CM

## 2017-05-12 MED FILL — VEMLIDY 25 MG TABLET: 25 | 30 days supply | Qty: 30 | Fill #0

## 2017-05-21 ENCOUNTER — Telehealth: Payer: Self-pay | Admitting: Internal Medicine

## 2017-05-21 NOTE — Telephone Encounter (Signed)
Copied from CRM #[id number]. Topic: Quick [id number] - See Telephone Encounter >> May 21, 2017  3:03 PM Ninfa MeekerPoole, Bridgett H wrote: CRM for notification. See Telephone encounter for: 05/21/17.  Called pt interpreter Wilkie Aye(Kristy) left voicemail for her to call back to reschedule pt appt per pcp.

## 2017-06-02 ENCOUNTER — Encounter: Payer: Self-pay | Admitting: Gastroenterology

## 2017-06-04 ENCOUNTER — Encounter: Payer: Self-pay | Admitting: Internal Medicine

## 2017-06-04 ENCOUNTER — Ambulatory Visit: Payer: Medicare Other | Admitting: Internal Medicine

## 2017-06-04 VITALS — BP 122/68 | HR 68 | Temp 97.4°F | Resp 14 | Ht 61.0 in | Wt 141.0 lb

## 2017-06-04 DIAGNOSIS — J302 Other seasonal allergic rhinitis: Secondary | ICD-10-CM

## 2017-06-04 DIAGNOSIS — B181 Chronic viral hepatitis B without delta-agent: Secondary | ICD-10-CM

## 2017-06-04 DIAGNOSIS — R972 Elevated prostate specific antigen [PSA]: Secondary | ICD-10-CM | POA: Diagnosis not present

## 2017-06-04 NOTE — Patient Instructions (Addendum)
Next visit in 6 months for a physical     For cough:  Take Mucinex DM twice a day as needed until better  For nasal congestion: Use OTC   Flonase : 2 nasal sprays on each side of the nose in the morning until you feel better  Use a antihistaminic like claritin 5 mg OTC 1 tablet a day as needed for   Call if not gradually better over the next  10 days  Call anytime if the symptoms are severe

## 2017-06-04 NOTE — Progress Notes (Signed)
Pre visit review using our clinic review tool, if applicable. No additional management support is needed unless otherwise documented below in the visit note. 

## 2017-06-04 NOTE — Progress Notes (Signed)
Subjective:    Patient ID: Kevin Hutchinson, male    DOB: 12/19/1934, 83 y.o.   MRN: [id number]  DOS:  06/04/2017 Type of visit - description : rov, here w/ wife, interpreter [phone] Interval history: I asked him about concerns, he reports 10 days history of cough and sneezing. He also has a history of increased PSA, currently with no symptoms. ID note reviewed   Review of Systems Denies fever chills he has some aches and pains, more than baseline for the last few days? Admits to itchy eyes and throat. He reports + sputum with cough. Denies dysuria or difficulty urinating.   Past Medical History:  Diagnosis Date  . GERD (gastroesophageal reflux disease)    EGD 5-09: : Duodenitis without Hemorrhage, Esophageal Stricture.  . Hepatitis B   . Hyperlipidemia   . PPD positive    Was Rx INH x 3 months (2008)--HD      Past Surgical History:  Procedure Laterality Date  . NO PAST SURGERIES      Social History   Socioeconomic History  . Marital status: Married    Spouse name: Not on file  . Number of children: 5  . Years of education: Not on file  . Highest education level: Not on file  Occupational History  . Occupation: n/a  Social Needs  . Financial resource strain: Not on file  . Food insecurity:    Worry: Not on file    Inability: Not on file  . Transportation needs:    Medical: Not on file    Non-medical: Not on file  Tobacco Use  . Smoking status: Former Games developer  . Smokeless tobacco: Never Used  . Tobacco comment: quit in the 90s  Substance and Sexual Activity  . Alcohol use: Yes    Comment: RARE  . Drug use: No  . Sexual activity: Not on file  Lifestyle  . Physical activity:    Days per week: Not on file    Minutes per session: Not on file  . Stress: Not on file  Relationships  . Social connections:    Talks on phone: Not on file    Gets together: Not on file    Attends religious service: Not on file    Active member of club or organization: Not on  file    Attends meetings of clubs or organizations: Not on file    Relationship status: Not on file  . Intimate partner violence:    Fear of current or ex partner: Not on file    Emotionally abused: Not on file    Physically abused: Not on file    Forced sexual activity: Not on file  Other Topics Concern  . Not on file  Social History Narrative   Household: Lives w/ wife only   FROM [place]      Allergies as of 06/04/2017   No Known Allergies     Medication List        Accurate as of 06/04/17 11:59 PM. Always use your most recent med list.          MULTIVITAMIN ADULT PO Take by mouth.   pantoprazole 40 MG tablet Commonly known as:  PROTONIX Take 1 tablet (40 mg total) by mouth daily.   tamsulosin 0.4 MG Caps capsule Commonly known as:  FLOMAX Take 1 capsule (0.4 mg total) by mouth daily after supper.   VEMLIDY 25 MG Tabs Generic drug:  Tenofovir Alafenamide Fumarate TAKE 1 TABLET BY MOUTH ONCE  DAILY          Objective:   Physical Exam BP 122/68 (BP Location: Left Arm, Patient Position: Sitting, Cuff Size: Small)   Pulse 68   Temp (!) 97.4 F (36.3 C) (Oral)   Resp 14   Ht 5\' 1"  (1.549 m)   Wt 141 lb (64 kg)   SpO2 97%   BMI 26.64 kg/m  General:   Well developed, well nourished . NAD.  HEENT:  Normocephalic . Face symmetric, atraumatic.  TMs normal, nose is slightly congested.  Throat symmetric Lungs:  CTA B Normal respiratory effort, no intercostal retractions, no accessory muscle use. Heart: RRR,  no murmur.  No pretibial edema bilaterally  Skin: Not pale. Not jaundice Neurologic:  alert & oriented X3.  Speech normal, gait appropriate for age and unassisted Psych--  Cognition and judgment appear intact.  Cooperative with normal attention span and concentration.  Behavior appropriate. No anxious or depressed appearing.      Assessment & Plan:   Assessment Anemia GERD: EGD 06-2007: duodenitis, esophageal stricture BPH, slightly increased  PSA, declined to see urology 2015, 05/2017 Chronic hepatitis B H/o + PPD, INH 3 months 2008 HOH, s/p hearing assessment 11/30/2014: Severe hearing loss, has hearing aids   Plan: Allergies: Symptoms of cough and sneezing likely related to allergies, recommend Claritin, Flonase, Mucinex DM.  If he is not better, advised to call for a referral to an allergist. Increased PSA: Via the interpreter I explained the patient that the PSA is elevated, that might mean that he has a slightly enlarged prostate, could be age-related or cancer.  I offer him urology evaluation.  Although the wife is concerned about it the patient is reluctant to proceed.  I told him that I will recheck a PSA when he come back in few months.  He remains asymptomatic. Chronic hepatitis B: Follow-up by ID, last visit 02-2017. RTC 6 months

## 2017-06-05 DIAGNOSIS — J302 Other seasonal allergic rhinitis: Secondary | ICD-10-CM | POA: Insufficient documentation

## 2017-06-05 DIAGNOSIS — R972 Elevated prostate specific antigen [PSA]: Secondary | ICD-10-CM | POA: Insufficient documentation

## 2017-06-05 NOTE — Assessment & Plan Note (Signed)
Allergies: Symptoms of cough and sneezing likely related to allergies, recommend Claritin, Flonase, Mucinex DM.  If he is not better, advised to call for a referral to an allergist. Increased PSA: Via the interpreter I explained the patient that the PSA is elevated, that might mean that he has a slightly enlarged prostate, could be age-related or cancer.  I offer him urology evaluation.  Although the wife is concerned about it the patient is reluctant to proceed.  I told him that I will recheck a PSA when he come back in few months.  He remains asymptomatic. Chronic hepatitis B: Follow-up by ID, last visit 02-2017. RTC 6 months

## 2017-06-12 MED FILL — VEMLIDY 25 MG TABLET: 25 | 30 days supply | Qty: 30 | Fill #1

## 2017-06-24 ENCOUNTER — Encounter: Payer: Self-pay | Admitting: Internal Medicine

## 2017-06-24 ENCOUNTER — Ambulatory Visit (INDEPENDENT_AMBULATORY_CARE_PROVIDER_SITE_OTHER): Payer: Medicare Other | Admitting: Internal Medicine

## 2017-06-24 VITALS — BP 153/77 | HR 66 | Temp 97.5°F | Wt 142.0 lb

## 2017-06-24 DIAGNOSIS — N182 Chronic kidney disease, stage 2 (mild): Secondary | ICD-10-CM | POA: Diagnosis not present

## 2017-06-24 DIAGNOSIS — B181 Chronic viral hepatitis B without delta-agent: Secondary | ICD-10-CM | POA: Diagnosis not present

## 2017-06-24 DIAGNOSIS — Z79899 Other long term (current) drug therapy: Secondary | ICD-10-CM

## 2017-06-24 DIAGNOSIS — R030 Elevated blood-pressure reading, without diagnosis of hypertension: Secondary | ICD-10-CM | POA: Diagnosis not present

## 2017-06-24 LAB — COMPLETE METABOLIC PANEL WITH GFR
AG Ratio: 1.5 (calc) (ref 1.0–2.5)
ALT: 19 U/L (ref 9–46)
AST: 21 U/L (ref 10–35)
Albumin: 4.5 g/dL (ref 3.6–5.1)
Alkaline phosphatase (APISO): 71 U/L (ref 40–115)
BUN / CREAT RATIO: 9 (calc) (ref 6–22)
BUN: 12 mg/dL (ref 7–25)
CO2: 27 mmol/L (ref 20–32)
CREATININE: 1.38 mg/dL — AB (ref 0.70–1.11)
Calcium: 9.5 mg/dL (ref 8.6–10.3)
Chloride: 101 mmol/L (ref 98–110)
GFR, EST AFRICAN AMERICAN: 56 mL/min/{1.73_m2} — AB (ref >=60)
GFR, EST NON AFRICAN AMERICAN: 48 mL/min/{1.73_m2} — AB (ref >=60)
GLOBULIN: 3.1 g/dL (ref 1.9–3.7)
Glucose, Bld: 149 mg/dL — ABNORMAL HIGH (ref 65–99)
Potassium: 5.3 mmol/L (ref 3.5–5.3)
SODIUM: 138 mmol/L (ref 135–146)
Total Bilirubin: 0.4 mg/dL (ref 0.2–1.2)
Total Protein: 7.6 g/dL (ref 6.1–8.1)

## 2017-06-24 LAB — PROTIME-INR
INR: 1
Prothrombin Time: 10.3 s (ref 9.0–11.5)

## 2017-06-24 NOTE — Progress Notes (Signed)
RFV: chronic hep B  Patient ID: Kevin Hutchinson, male   DOB: [birth year], 83 y.o.   MRN: [id number]  HPI 83yo vietnamese speaking male with history of chronic hepatitis B on TAF.doing well with adherence. He denies any health complaints. Here with his wife who confirms his answers. She mentioned they are going to [place] for 3 months and is wondering how they will get access to medications.   Discussion done through video translation Outpatient Encounter Medications as of 06/24/2017  Medication Sig  . Multiple Vitamins-Minerals (MULTIVITAMIN ADULT PO) Take by mouth.  . pantoprazole (PROTONIX) 40 MG tablet Take 1 tablet (40 mg total) by mouth daily.  . tamsulosin (FLOMAX) 0.4 MG CAPS capsule Take 1 capsule (0.4 mg total) by mouth daily after supper.  . VEMLIDY 25 MG TABS TAKE 1 TABLET BY MOUTH ONCE DAILY   No facility-administered encounter medications on file as of 06/24/2017.      Patient Active Problem List   Diagnosis Date Noted  . Seasonal allergies 06/05/2017  . Elevated PSA 06/05/2017  . PCP NOTES >>>>>>>>>>>>>>. 03/21/2016  . Hearing loss 03/16/2015  . Pain in joint, shoulder region 06/28/2013  . Annual physical exam 01/01/2011  . BPH (benign prostatic hyperplasia) 01/01/2011  . DIZZINESS 05/16/2009  . HYPERLIPIDEMIA 06/15/2007  . Hepatitis B 06/15/2007  . GERD 03/12/2007  . 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 BACK PAIN 02/16/2007   Social History   Tobacco Use  . Smoking status: Former Games developer  . Smokeless tobacco: Never Used  . Tobacco comment: quit in the 90s  Substance Use Topics  . Alcohol use: Yes    Comment: RARE  . Drug use: No    There are no preventive care reminders to display for this patient.   Review of Systems Review of Systems  Constitutional: Negative for fever, chills, diaphoresis, activity change, appetite change, fatigue and unexpected weight change.  HENT: Negative for congestion, sore throat, rhinorrhea, sneezing, trouble swallowing and sinus  pressure.  Eyes: Negative for photophobia and visual disturbance.  Respiratory: Negative for cough, chest tightness, shortness of breath, wheezing and stridor.  Cardiovascular: Negative for chest pain, palpitations and leg swelling.  Gastrointestinal: Negative for nausea, vomiting, abdominal pain, diarrhea, constipation, blood in stool, abdominal distention and anal bleeding.  Genitourinary: Negative for dysuria, hematuria, flank pain and difficulty urinating.  Musculoskeletal: Negative for myalgias, back pain, joint swelling, arthralgias and gait problem.  Skin: Negative for color change, pallor, rash and wound.  Neurological: Negative for dizziness, tremors, weakness and light-headedness.  Hematological: Negative for adenopathy. Does not bruise/bleed easily.  Psychiatric/Behavioral: Negative for behavioral problems, confusion, sleep disturbance, dysphoric mood, decreased concentration and agitation.    Physical Exam   BP (!) 153/77   Pulse 66   Temp (!) 97.5 F (36.4 C) (Oral)   Wt 142 lb (64.4 kg)   BMI 26.83 kg/m   Physical Exam  Constitutional: He is oriented to person, place, and time. He appears well-developed and well-nourished. No distress.  HENT:  Mouth/Throat: Oropharynx is clear and moist. No oropharyngeal exudate.  Cardiovascular: Normal rate, regular rhythm and normal heart sounds. Exam reveals no gallop and no friction rub.  No murmur heard.  Pulmonary/Chest: Effort normal and breath sounds normal. No respiratory distress. He has no wheezes.  Abdominal: Soft. Bowel sounds are normal. He exhibits no distension. There is no tenderness.  Lymphadenopathy:  He has no cervical adenopathy.  Neurological: He is alert and oriented to person, place, and time.  Skin: Skin is warm  and dry. No rash noted. No erythema.  Psychiatric: He has a normal mood and affect. His behavior is normal.    Lab Results  Component Value Date   HEPBSAB NON-REACTIVE 02/27/2017   No results  found for: RPR, LABRPR  CBC Lab Results  Component Value Date   WBC 8.5 02/27/2017   RBC 5.65 02/27/2017   HGB 14.7 02/27/2017   HCT 43.0 02/27/2017   PLT 139 (L) 02/27/2017   MCV 76.1 (L) 02/27/2017   MCH 26.0 (L) 02/27/2017   MCHC 34.2 02/27/2017   RDW 14.1 02/27/2017   LYMPHSABS 3,681 02/27/2017   MONOABS 665 08/27/2016   EOSABS 238 02/27/2017    BMET Lab Results  Component Value Date   NA 139 08/27/2016   K 4.9 08/27/2016   CL 100 08/27/2016   CO2 28 08/27/2016   GLUCOSE 111 (H) 08/27/2016   BUN 22 08/27/2016   CREATININE 1.20 (H) 08/27/2016   CALCIUM 9.5 08/27/2016   GFRNONAA 57 (L) 08/27/2016   GFRAA 66 08/27/2016      Assessment and Plan Chronic hepatitis b = will check hep B VL, and surface ab to see if he has converted. Has not had liver ultrasound for screening HCC will be ordered to be completed by next appt. We will see back before their extended trip to get access to meds  Will be leaving at the end of oct for 3 month trip to [place]. Would like assistance to get medications for the duration of his trip  Long term medication monitoring = creatinine is stable.-stable ckd 2  Pre hypertension = elevated BP during this visit. Will continue to monitor

## 2017-06-25 LAB — HEPATITIS B E ANTIBODY: HEP B E AB: REACTIVE — AB

## 2017-06-26 LAB — HEPATITIS B DNA, ULTRAQUANTITATIVE, PCR: Hepatitis B DNA (Calc): <1 Log IU/mL

## 2017-07-22 MED FILL — VEMLIDY 25 MG TABLET: 25 | 30 days supply | Qty: 30 | Fill #2

## 2017-08-14 MED FILL — VEMLIDY 25 MG TABLET: 25 | 30 days supply | Qty: 30 | Fill #3

## 2017-09-16 MED FILL — VEMLIDY 25 MG TABLET: 25 | 30 days supply | Qty: 30 | Fill #4

## 2017-10-16 MED FILL — VEMLIDY 25 MG TABLET: 25 | 30 days supply | Qty: 30 | Fill #5

## 2017-11-12 ENCOUNTER — Ambulatory Visit (INDEPENDENT_AMBULATORY_CARE_PROVIDER_SITE_OTHER): Payer: Medicare Other | Admitting: Internal Medicine

## 2017-11-12 ENCOUNTER — Encounter: Payer: Self-pay | Admitting: Internal Medicine

## 2017-11-12 VITALS — BP 143/81 | HR 67 | Temp 98.0°F | Ht 61.0 in | Wt 140.0 lb

## 2017-11-12 DIAGNOSIS — R972 Elevated prostate specific antigen [PSA]: Secondary | ICD-10-CM

## 2017-11-12 DIAGNOSIS — Z23 Encounter for immunization: Secondary | ICD-10-CM

## 2017-11-12 DIAGNOSIS — B181 Chronic viral hepatitis B without delta-agent: Secondary | ICD-10-CM

## 2017-11-12 MED FILL — VEMLIDY 25 MG TABLET: 25 | 90 days supply | Qty: 90 | Fill #6

## 2017-11-12 NOTE — Patient Instructions (Signed)
At today's visit. We have done blood draw, given you influenza vaccine plus arranged for ultrasound  We will contact you to getting you 3 bottles of your medication for your upcoming trip to [place]  Make sure that your wife also gets the influenza vaccine in the coming month

## 2017-11-12 NOTE — Progress Notes (Signed)
RFV: chronic hep B  Patient ID: Kevin Hutchinson, male   DOB: 1934/06/28, 83 y.o.   MRN: [id number]  HPI Kevin Hutchinson is an 83 yo - going to [place] for extended period of time. Nov 27th through feb 8th - will need 3 months of meds/ as well as moving up. He is otherwise doing well with his health. He continues to take TAF for his Chronic hep B  Outpatient Encounter Medications as of 11/12/2017  Medication Sig  . Multiple Vitamins-Minerals (MULTIVITAMIN ADULT PO) Take by mouth.  . pantoprazole (PROTONIX) 40 MG tablet Take 1 tablet (40 mg total) by mouth daily.  . tamsulosin (FLOMAX) 0.4 MG CAPS capsule Take 1 capsule (0.4 mg total) by mouth daily after supper.  . VEMLIDY 25 MG TABS TAKE 1 TABLET BY MOUTH ONCE DAILY   No facility-administered encounter medications on file as of 11/12/2017.      Patient Active Problem List   Diagnosis Date Noted  . Seasonal allergies 06/05/2017  . Elevated PSA 06/05/2017  . PCP NOTES >>>>>>>>>>>>>>. 03/21/2016  . Hearing loss 03/16/2015  . Pain in joint, shoulder region 06/28/2013  . Annual physical exam 01/01/2011  . BPH (benign prostatic hyperplasia) 01/01/2011  . DIZZINESS 05/16/2009  . HYPERLIPIDEMIA 06/15/2007  . Hepatitis B 06/15/2007  . GERD 03/12/2007  . 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 BACK PAIN 02/16/2007     Health Maintenance Due  Topic Date Due  . INFLUENZA VACCINE  09/25/2017     Review of Systems Review of Systems  Constitutional: Negative for fever, chills, diaphoresis, activity change, appetite change, fatigue and unexpected weight change.  HENT: Negative for congestion, sore throat, rhinorrhea, sneezing, trouble swallowing and sinus pressure.  Eyes: Negative for photophobia and visual disturbance.  Respiratory: Negative for cough, chest tightness, shortness of breath, wheezing and stridor.  Cardiovascular: Negative for chest pain, palpitations and leg swelling.  Gastrointestinal: Negative for nausea, vomiting, abdominal pain,  diarrhea, constipation, blood in stool, abdominal distention and anal bleeding.  Genitourinary: Negative for dysuria, hematuria, flank pain and difficulty urinating.  Musculoskeletal: Negative for myalgias, back pain, joint swelling, arthralgias and gait problem.  Skin: Negative for color change, pallor, rash and wound.  Neurological: Negative for dizziness, tremors, weakness and light-headedness.  Hematological: Negative for adenopathy. Does not bruise/bleed easily.  Psychiatric/Behavioral: Negative for behavioral problems, confusion, sleep disturbance, dysphoric mood, decreased concentration and agitation.    Physical Exam  BP (!) 143/81   Pulse 67   Temp 98 F (36.7 C)   Ht 5\' 1"  (1.549 m)   Wt 140 lb (63.5 kg)   BMI 26.45 kg/m  Physical Exam  Constitutional: He is oriented to person, place, and time. He appears well-developed and well-nourished. No distress.  HENT:  Mouth/Throat: Oropharynx is clear and moist. No oropharyngeal exudate.  Cardiovascular: Normal rate, regular rhythm and normal heart sounds. Exam reveals no gallop and no friction rub.  No murmur heard.  Pulmonary/Chest: Effort normal and breath sounds normal. No respiratory distress. He has no wheezes.  Abdominal: Soft. Bowel sounds are normal. He exhibits no distension. There is no tenderness.  Lymphadenopathy:  He has no cervical adenopathy.  Neurological: He is alert and oriented to person, place, and time.  Skin: Skin is warm and dry. No rash noted. No erythema.  Psychiatric: He has a normal mood and affect. His behavior is normal.    Lab Results  Component Value Date   HEPBSAB NON-REACTIVE 02/27/2017   No results found for: RPR, LABRPR  CBC Lab Results  Component Value Date   WBC 8.5 02/27/2017   RBC 5.65 02/27/2017   HGB 14.7 02/27/2017   HCT 43.0 02/27/2017   PLT 139 (L) 02/27/2017   MCV 76.1 (L) 02/27/2017   MCH 26.0 (L) 02/27/2017   MCHC 34.2 02/27/2017   RDW 14.1 02/27/2017   LYMPHSABS  3,681 02/27/2017   MONOABS 665 08/27/2016   EOSABS 238 02/27/2017    BMET Lab Results  Component Value Date   NA 138 06/24/2017   K 5.3 06/24/2017   CL 101 06/24/2017   CO2 27 06/24/2017   GLUCOSE 149 (H) 06/24/2017   BUN 12 06/24/2017   CREATININE 1.38 (H) 06/24/2017   CALCIUM 9.5 06/24/2017   GFRNONAA 48 (L) 06/24/2017   GFRAA 56 (L) 06/24/2017      Assessment and Plan  Chronic hepatitis B = Will check labs today to see how he is doing in addition to arranging for liver ultrasound imaging, which is past due  Hx of elevated PSA = appears not recently checked. Will email his pcp for further follow up. Will check psa  Health maintenance =Flu shot today

## 2017-11-14 LAB — CBC
HCT: 44.5 % (ref 38.5–50.0)
HEMOGLOBIN: 14.9 g/dL (ref 13.2–17.1)
MCH: 25.3 pg — ABNORMAL LOW (ref 27.0–33.0)
MCHC: 33.5 g/dL (ref 32.0–36.0)
MCV: 75.4 fL — AB (ref 80.0–100.0)
MPV: 12.1 fL (ref 7.5–12.5)
Platelets: 139 10*3/uL — ABNORMAL LOW (ref 140–400)
RBC: 5.9 10*6/uL — ABNORMAL HIGH (ref 4.20–5.80)
RDW: 14 % (ref 11.0–15.0)
WBC: 8.5 10*3/uL (ref 3.8–10.8)

## 2017-11-14 LAB — AFP TUMOR MARKER: AFP-Tumor Marker: 4 ng/mL (ref <6.1)

## 2017-11-14 LAB — COMPLETE METABOLIC PANEL WITH GFR
AG Ratio: 1.4 (calc) (ref 1.0–2.5)
ALBUMIN MSPROF: 4.5 g/dL (ref 3.6–5.1)
ALT: 18 U/L (ref 9–46)
AST: 19 U/L (ref 10–35)
Alkaline phosphatase (APISO): 66 U/L (ref 40–115)
BUN: 11 mg/dL (ref 7–25)
CO2: 27 mmol/L (ref 20–32)
Calcium: 9.5 mg/dL (ref 8.6–10.3)
Chloride: 101 mmol/L (ref 98–110)
Creat: 1.04 mg/dL (ref 0.70–1.11)
GFR, EST AFRICAN AMERICAN: 78 mL/min/{1.73_m2} (ref >=60)
GFR, Est Non African American: 67 mL/min/{1.73_m2} (ref >=60)
GLUCOSE: 140 mg/dL — AB (ref 65–99)
Globulin: 3.3 g/dL (calc) (ref 1.9–3.7)
Potassium: 4.4 mmol/L (ref 3.5–5.3)
Sodium: 139 mmol/L (ref 135–146)
TOTAL PROTEIN: 7.8 g/dL (ref 6.1–8.1)
Total Bilirubin: 0.7 mg/dL (ref 0.2–1.2)

## 2017-11-14 LAB — HEPATITIS B DNA, ULTRAQUANTITATIVE, PCR: Hepatitis B DNA (Calc): <1 Log IU/mL

## 2017-11-14 LAB — PROTIME-INR
INR: 1
PROTHROMBIN TIME: 10.2 s (ref 9.0–11.5)

## 2017-11-14 LAB — HEPATITIS B SURFACE ANTIBODY,QUALITATIVE: HEP B S AB: NONREACTIVE

## 2017-11-14 LAB — PSA: PSA: 3.2 ng/mL (ref <=4.0)

## 2017-11-20 ENCOUNTER — Ambulatory Visit
Admission: RE | Admit: 2017-11-20 | Discharge: 2017-11-20 | Disposition: A | Discharge status: Home / Self Care | Payer: Medicare Other | Source: Ambulatory Visit | Attending: Internal Medicine | Admitting: Internal Medicine

## 2017-11-20 DIAGNOSIS — B181 Chronic viral hepatitis B without delta-agent: Secondary | ICD-10-CM

## 2017-12-04 ENCOUNTER — Other Ambulatory Visit: Payer: Medicare Other

## 2017-12-04 ENCOUNTER — Ambulatory Visit (INDEPENDENT_AMBULATORY_CARE_PROVIDER_SITE_OTHER): Payer: Medicare Other | Admitting: Internal Medicine

## 2017-12-04 ENCOUNTER — Encounter: Payer: Self-pay | Admitting: Internal Medicine

## 2017-12-04 VITALS — BP 126/62 | HR 84 | Resp 16 | Ht 61.0 in | Wt 141.1 lb

## 2017-12-04 DIAGNOSIS — Z0000 Encounter for general adult medical examination without abnormal findings: Secondary | ICD-10-CM

## 2017-12-04 DIAGNOSIS — R739 Hyperglycemia, unspecified: Secondary | ICD-10-CM

## 2017-12-04 DIAGNOSIS — R972 Elevated prostate specific antigen [PSA]: Secondary | ICD-10-CM | POA: Diagnosis not present

## 2017-12-04 LAB — LIPID PANEL
CHOLESTEROL: 209 mg/dL — AB (ref 0–200)
HDL: 30.3 mg/dL — ABNORMAL LOW (ref >39.00)
TRIGLYCERIDES: 1123 mg/dL — AB (ref 0.0–149.0)
Total CHOL/HDL Ratio: 7

## 2017-12-04 LAB — LDL CHOLESTEROL, DIRECT: Direct LDL: 63 mg/dL

## 2017-12-04 LAB — PSA: PSA: 5.02 ng/mL — AB (ref 0.10–4.00)

## 2017-12-04 NOTE — Assessment & Plan Note (Addendum)
-   Td--2011;pneumonia shot 2009 (due for booster, not available today); prevnar 2015; had zostavax before; had a Flu shot today. Shingrix discussed before .  -CCS: Cscope 5-09 @ [place]: TICs.  We talk about a colonoscopy, he is somewhat interested but not at this point; is taking a trip to [place]  and we agreed he will call me when he is back -Prostate cancer screening: see BPH -Diet-exercise discussed  -Labs reviewed: will get a  PSA, FLP, A1c (mild hyperglycemia noted)

## 2017-12-04 NOTE — Patient Instructions (Addendum)
GO TO THE LAB : Get the blood work     GO TO THE FRONT DESK Schedule your next appointment for a  Check up in 6 to 8 months  Call in two weeks to get a pneumonia shot    Mucinex DM OTC 1 tablet by mouth twice daily Flonase OTC 1 spray nasally daily  For stomach- come next week

## 2017-12-04 NOTE — Progress Notes (Signed)
Pre visit review using our clinic review tool, if applicable. No additional management support is needed unless otherwise documented below in the visit note. 

## 2017-12-04 NOTE — Progress Notes (Signed)
Subjective:    Patient ID: Kevin Hutchinson, male    DOB: 1934-09-26, 83 y.o.   MRN: [id number]  DOS:  12/04/2017 Type of visit - description : cpx, here with his wife.  We communicated via video interpreter. Interval history: Has no major concerns   Review of Systems  When asked, he admits that he is somewhat tired in the mornings. Specifically denies chest pain, difficulty breathing, lower extremity edema. No anxiety or depression Has a history of GERD, reportedly taking his medication, denies nausea, vomiting, diarrhea or blood in the stools.  States that occasionally has some indigestion but could not clarify more what he means by that. No recent urinary symptoms, ran out of Flomax several months ago. Also has left knee pain, increased with walking, this is going on for several months, request a parking permit.  Other than above, a 14 point review of systems is negative    Past Medical History:  Diagnosis Date  . GERD (gastroesophageal reflux disease)    EGD 5-09: : Duodenitis without Hemorrhage, Esophageal Stricture.  . Hepatitis B   . Hyperlipidemia   . PPD positive    Was Rx INH x 3 months (2008)--HD      Past Surgical History:  Procedure Laterality Date  . NO PAST SURGERIES      Social History   Socioeconomic History  . Marital status: Married    Spouse name: Not on file  . Number of children: 5  . Years of education: Not on file  . Highest education level: Not on file  Occupational History  . Occupation: n/a  Social Needs  . Financial resource strain: Not on file  . Food insecurity:    Worry: Not on file    Inability: Not on file  . Transportation needs:    Medical: Not on file    Non-medical: Not on file  Tobacco Use  . Smoking status: Former Games developer  . Smokeless tobacco: Never Used  . Tobacco comment: quit in the 90s  Substance and Sexual Activity  . Alcohol use: Yes    Comment: RARE  . Drug use: No  . Sexual activity: Not on file  Lifestyle  .  Physical activity:    Days per week: Not on file    Minutes per session: Not on file  . Stress: Not on file  Relationships  . Social connections:    Talks on phone: Not on file    Gets together: Not on file    Attends religious service: Not on file    Active member of club or organization: Not on file    Attends meetings of clubs or organizations: Not on file    Relationship status: Not on file  . Intimate partner violence:    Fear of current or ex partner: Not on file    Emotionally abused: Not on file    Physically abused: Not on file    Forced sexual activity: Not on file  Other Topics Concern  . Not on file  Social History Narrative   Household: Lives w/ wife only   FROM [place]    History reviewed. No pertinent family history.   Allergies as of 12/04/2017   No Known Allergies     Medication List        Accurate as of 12/04/17 11:59 PM. Always use your most recent med list.          MULTIVITAMIN ADULT PO Take by mouth.   pantoprazole 40 MG  tablet Commonly known as:  PROTONIX Take 1 tablet (40 mg total) by mouth daily.   VEMLIDY 25 MG Tabs Generic drug:  Tenofovir Alafenamide Fumarate TAKE 1 TABLET BY MOUTH ONCE DAILY          Objective:   Physical Exam BP 126/62 (BP Location: Right Arm, Patient Position: Sitting, Cuff Size: Normal)   Pulse 84   Resp 16   Ht 5\' 1"  (1.549 m)   Wt 141 lb 2 oz (64 kg)   SpO2 98%   BMI 26.67 kg/m  General: Well developed, NAD, see BMI.  Neck: No  thyromegaly  HEENT:  Normocephalic . Face symmetric, atraumatic Lungs:  CTA B Normal respiratory effort, no intercostal retractions, no accessory muscle use. Heart: RRR,  no murmur.  No pretibial edema bilaterally  Abdomen:  Not distended, soft, non-tender. No rebound or rigidity.   Skin: Exposed areas without rash. Not pale. 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  Range of motion is  normal. Rectal: External abnormalities: none. Normal sphincter tone. No rectal masses or tenderness.  Brown stools Prostate: Prostate gland firm and smooth, no enlargement, nodularity, tenderness, mass, asymmetry or induration Neurologic:  alert & oriented X3.  Speech normal, gait appropriate for age and unassisted Strength symmetric and appropriate for age.  Psych: Cognition and judgment appear intact.  Cooperative with normal attention span and concentration.  Behavior appropriate. No anxious or depressed appearing.     Assessment & Plan:   Assessment Anemia GERD: EGD 06-2007: duodenitis, esophageal stricture BPH, slightly increased PSA, declined to see urology 2015, 05/2017 Chronic hepatitis B H/o + PPD, INH 3 months 2008 [facility], s/p hearing assessment 11/30/2014: Severe hearing loss, has hearing aids   Plan: BPH,Increased PSA: Patient has no symptoms despite running out of Flomax.  DRE today is benign, will recheck a PSA. Restart flomax if needed  GERD: Continue Protonix. After the OV ended and  he had blood work, he requests "something for his his stomach".  I have not been able to clarify exactly what he means by "indigestion" although he reports no worrisome symptoms, see review of system.  Recommend to come back next week and will spend the whole visit trying to clarify the issue. DJD, knee: Request parking permit, I think that is reasonable.  Will do. URI: Recommend Mucinex DM and Flonase.  Call if no better RTC in 6-8 months

## 2017-12-05 LAB — HEMOGLOBIN A1C
HEMOGLOBIN A1C: 8 %{Hb} — AB (ref <5.7)
Mean Plasma Glucose: 183 (calc)
eAG (mmol/L): 10.1 (calc)

## 2017-12-05 NOTE — Assessment & Plan Note (Signed)
  BPH,Increased PSA: Patient has no symptoms despite running out of Flomax.  DRE today is benign, will recheck a PSA. Restart flomax if needed  GERD: Continue Protonix. After the OV ended and  he had blood work, he requests "something for his his stomach".  I have not been able to clarify exactly what he means by "indigestion" although he reports no worrisome symptoms, see review of system.  Recommend to come back next week and will spend the whole visit trying to clarify the issue. DJD, knee: Request parking permit, I think that is reasonable.  Will do. URI: Recommend Mucinex DM and Flonase.  Call if no better RTC in 6-8 months

## 2017-12-11 ENCOUNTER — Other Ambulatory Visit: Payer: Self-pay | Admitting: Internal Medicine

## 2017-12-11 ENCOUNTER — Ambulatory Visit (INDEPENDENT_AMBULATORY_CARE_PROVIDER_SITE_OTHER): Payer: Medicare Other | Admitting: Internal Medicine

## 2017-12-11 ENCOUNTER — Encounter: Payer: Self-pay | Admitting: Internal Medicine

## 2017-12-11 VITALS — BP 132/70 | HR 72 | Temp 98.1°F | Resp 16 | Ht 61.0 in | Wt 138.5 lb

## 2017-12-11 DIAGNOSIS — K219 Gastro-esophageal reflux disease without esophagitis: Secondary | ICD-10-CM

## 2017-12-11 DIAGNOSIS — Z23 Encounter for immunization: Secondary | ICD-10-CM

## 2017-12-11 DIAGNOSIS — E119 Type 2 diabetes mellitus without complications: Secondary | ICD-10-CM

## 2017-12-11 DIAGNOSIS — E785 Hyperlipidemia, unspecified: Secondary | ICD-10-CM

## 2017-12-11 DIAGNOSIS — Z09 Encounter for follow-up examination after completed treatment for conditions other than malignant neoplasm: Secondary | ICD-10-CM

## 2017-12-11 DIAGNOSIS — R972 Elevated prostate specific antigen [PSA]: Secondary | ICD-10-CM | POA: Diagnosis not present

## 2017-12-11 HISTORY — DX: Type 2 diabetes mellitus without complications: E11.9

## 2017-12-11 MED ORDER — FENOFIBRATE 54 MG PO TABS: 54.0000 mg | ORAL_TABLET | Freq: Every day | ORAL | 0 refills | Status: DC

## 2017-12-11 MED ORDER — METFORMIN HCL 500 MG PO TABS: 500.0000 mg | ORAL_TABLET | Freq: Two times a day (BID) | ORAL | 0 refills | Status: DC

## 2017-12-11 MED ORDER — PANTOPRAZOLE SODIUM 40 MG PO TBEC: 40.0000 mg | DELAYED_RELEASE_TABLET | Freq: Every day | ORAL | 3 refills | Status: DC

## 2017-12-11 MED ORDER — TAMSULOSIN HCL 0.4 MG PO CAPS: 0.4000 mg | ORAL_CAPSULE | Freq: Every day | ORAL | 3 refills | Status: DC

## 2017-12-11 NOTE — Progress Notes (Addendum)
Subjective:    Patient ID: Kevin Hutchinson, male    DOB: March 27, 1934, 83 y.o.   MRN: [id number]  DOS:  12/11/2017 Type of visit - description : here to discuss labs, a translator is with him Interval history: The last visit he complained of indigestion, with the help of the translator I tried to get more details, reports that he has random aches at the  Abdomen; location left, right, upper or lower.  No obvious pattern. He is specifically denies nausea, vomiting, diarrhea or blood in the stools. Would like to have Protonix refill "just in case". Also, would like Flomax restarted "just in case".  Review of Systems   Past Medical History:  Diagnosis Date  . GERD (gastroesophageal reflux disease)    EGD 5-09: : Duodenitis without Hemorrhage, Esophageal Stricture.  . Hepatitis B   . Hyperlipidemia   . PPD positive    Was Rx INH x 3 months (2008)--HD      Past Surgical History:  Procedure Laterality Date  . NO PAST SURGERIES      Social History   Socioeconomic History  . Marital status: Married    Spouse name: Not on file  . Number of children: 5  . Years of education: Not on file  . Highest education level: Not on file  Occupational History  . Occupation: n/a  Social Needs  . Financial resource strain: Not on file  . Food insecurity:    Worry: Not on file    Inability: Not on file  . Transportation needs:    Medical: Not on file    Non-medical: Not on file  Tobacco Use  . Smoking status: Former Games developer  . Smokeless tobacco: Never Used  . Tobacco comment: quit in the 90s  Substance and Sexual Activity  . Alcohol use: Yes    Comment: RARE  . Drug use: No  . Sexual activity: Not on file  Lifestyle  . Physical activity:    Days per week: Not on file    Minutes per session: Not on file  . Stress: Not on file  Relationships  . Social connections:    Talks on phone: Not on file    Gets together: Not on file    Attends religious service: Not on file    Active  member of club or organization: Not on file    Attends meetings of clubs or organizations: Not on file    Relationship status: Not on file  . Intimate partner violence:    Fear of current or ex partner: Not on file    Emotionally abused: Not on file    Physically abused: Not on file    Forced sexual activity: Not on file  Other Topics Concern  . Not on file  Social History Narrative   Household: Lives w/ wife only   FROM [place]      Allergies as of 12/11/2017   No Known Allergies     Medication List        Accurate as of 12/11/17  6:55 PM. Always use your most recent med list.          fenofibrate 54 MG tablet Take 1 tablet (54 mg total) by mouth daily.   metFORMIN 500 MG tablet Commonly known as:  GLUCOPHAGE Take 1 tablet (500 mg total) by mouth 2 (two) times daily with a meal.   MULTIVITAMIN ADULT PO Take by mouth.   pantoprazole 40 MG tablet Commonly known as:  PROTONIX Take  1 tablet (40 mg total) by mouth daily before breakfast.   tamsulosin 0.4 MG Caps capsule Commonly known as:  FLOMAX Take 1 capsule (0.4 mg total) by mouth daily after supper.   VEMLIDY 25 MG Tabs Generic drug:  Tenofovir Alafenamide Fumarate TAKE 1 TABLET BY MOUTH ONCE DAILY          Objective:   Physical Exam BP 132/70 (BP Location: Left Arm, Patient Position: Sitting, Cuff Size: Small)   Pulse 72   Temp 98.1 F (36.7 C) (Oral)   Resp 16   Ht 5\' 1"  (1.549 m)   Wt 138 lb 8 oz (62.8 kg)   SpO2 97%   BMI 26.17 kg/m  General:   Well developed, NAD, see BMI.  HEENT:  Normocephalic . Face symmetric, atraumatic Neurologic:  alert & oriented X3.  Speech normal, gait appropriate for age and unassisted Psych--  Cognition and judgment appear intact.  Cooperative with normal attention span and concentration.  Behavior appropriate. No anxious or depressed appearing.      Assessment & Plan:    Assessment Anemia GERD: EGD 06-2007: duodenitis, esophageal stricture BPH,  slightly increased PSA, declined to see urology 2015, 05/2017 Chronic hepatitis B H/o + PPD, INH 3 months 2008 [facility], s/p hearing assessment 11/30/2014: Severe hearing loss, has hearing aids   Plan: Going to [place] for several months, he is leaving at the end of November. BPH, increased PSA: Last PSA 5.02, we discussed observation versus referral to urology, he would think about it and let me know after he returned from [place]. He desires to have a prescription for Flomax in case he develops BPH symptoms again. GERD, indigestion: Based on his symptoms, I cannot come up with a clear  diagnosis, symptoms seem mild and fleeting.  For now recommend observation and Protonix. DM: New onset, implications explained, diet discussed .  Recommend metformin 500 mg twice daily. He is leaving the country in few weeks x several months, rec LFTs before he leaves High triglycerides: 1123, recommend low-dose of fenofibrate. PNM 23 today Laboratory follow-up: Return to the office in 3 weeks just to check LFTs before he goes to [place].  (Addendum: Discussed with ID, okay to prescribe meds in the context of chronic hep B.) Follow-up here in few months  Today, I spent more than  25  min with the patient: >50% of the time counseling regards new onset of DM, explaining plan od care in detail through the translator

## 2017-12-11 NOTE — Progress Notes (Signed)
Pre visit review using our clinic review tool, if applicable. No additional management support is needed unless otherwise documented below in the visit note. 

## 2017-12-11 NOTE — Patient Instructions (Signed)
Please come back in 3 weeks before you go to [place] to have blood work drawn.  We will start 2 medications Metformin 500 mg: 1 tablet twice a day Fenofibrate 54 mg 1 tablet daily.    DIABETES self learn tools, many have info in [place]: Online resources: The American diabetes Association     diabetes.org  Visit [facility] website it is a [employer] Technology [employer]  joslin.org  The [facility] web site has a diabetes section  PinkCheek.be

## 2017-12-11 NOTE — Assessment & Plan Note (Addendum)
Going to [place] for several months, he is leaving at the end of November. BPH, increased PSA: Last PSA 5.02, we discussed observation versus referral to urology, he would think about it and let me know after he returned from [place]. He desires to have a prescription for Flomax in case he develops BPH symptoms again. GERD, indigestion: Based on his symptoms, I cannot come up with a clear  diagnosis, symptoms seem mild and fleeting.  For now recommend observation and Protonix. DM: New onset, implications explained, diet discussed .  Recommend metformin 500 mg twice daily. He is leaving the country in few weeks x several months, rec LFTs before he leaves High triglycerides: 1123, recommend low-dose of fenofibrate. PNM 23 today Laboratory follow-up: Return to the office in 3 weeks just to check LFTs before he goes to [place].   (Addendum: Discussed with ID, okay to prescribe meds in the context of chronic hep B.) Follow-up here in few months

## 2017-12-18 ENCOUNTER — Ambulatory Visit: Payer: Medicare Other

## 2017-12-29 DIAGNOSIS — H43393 Other vitreous opacities, bilateral: Secondary | ICD-10-CM | POA: Diagnosis not present

## 2017-12-29 DIAGNOSIS — H40033 Anatomical narrow angle, bilateral: Secondary | ICD-10-CM | POA: Diagnosis not present

## 2018-01-01 ENCOUNTER — Other Ambulatory Visit: Payer: Medicare Other

## 2018-01-02 ENCOUNTER — Other Ambulatory Visit: Payer: Self-pay

## 2018-01-02 ENCOUNTER — Other Ambulatory Visit (INDEPENDENT_AMBULATORY_CARE_PROVIDER_SITE_OTHER): Payer: Medicare Other

## 2018-01-02 DIAGNOSIS — E785 Hyperlipidemia, unspecified: Secondary | ICD-10-CM | POA: Diagnosis not present

## 2018-01-02 LAB — COMPREHENSIVE METABOLIC PANEL
ALK PHOS: 49 U/L (ref 39–117)
ALT: 15 U/L (ref 0–53)
AST: 17 U/L (ref 0–37)
Albumin: 4.6 g/dL (ref 3.5–5.2)
BUN: 13 mg/dL (ref 6–23)
CO2: 30 mEq/L (ref 19–32)
CREATININE: 1.08 mg/dL (ref 0.40–1.50)
Calcium: 9.4 mg/dL (ref 8.4–10.5)
Chloride: 103 mEq/L (ref 96–112)
GFR: 69.68 mL/min (ref >60.00)
Glucose, Bld: 141 mg/dL — ABNORMAL HIGH (ref 70–99)
Potassium: 5.2 mEq/L — ABNORMAL HIGH (ref 3.5–5.1)
SODIUM: 139 meq/L (ref 135–145)
TOTAL PROTEIN: 7.5 g/dL (ref 6.0–8.3)
Total Bilirubin: 0.5 mg/dL (ref 0.2–1.2)

## 2018-01-13 MED FILL — VEMLIDY 25 MG TABLET: 25 | 90 days supply | Qty: 90 | Fill #7

## 2018-03-05 IMAGING — US US ABDOMEN COMPLETE W/ ELASTOGRAPHY
1 series · 13 of 25 positions shown · non-contrast
Comparison: 04/12/2015

CLINICAL DATA: Chronic viral hepatitis-B.  Evaluate for fibrosis.



[Series 1: us abdomen complete w/ elastography · 0.16mm/px · 13 of 26 slices shown]
[im 1/26]
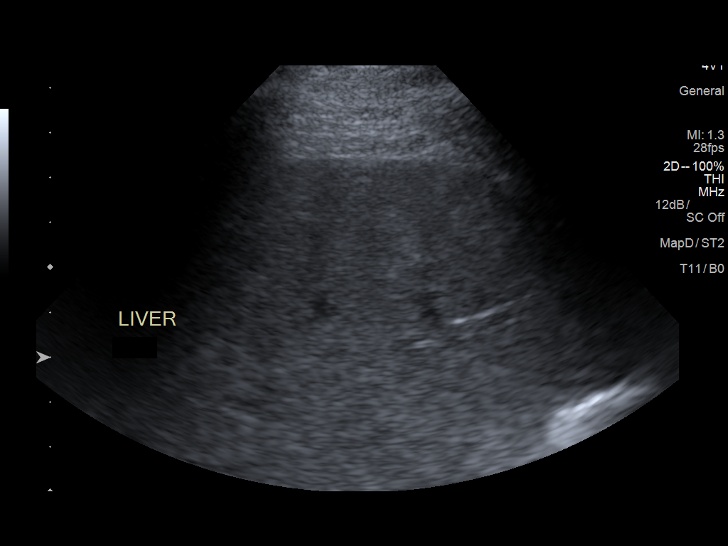
[im 3/26]
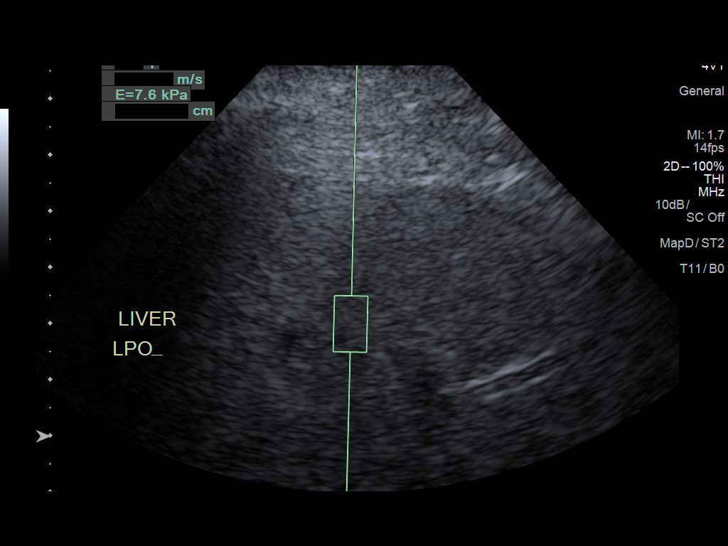
[im 5/26]
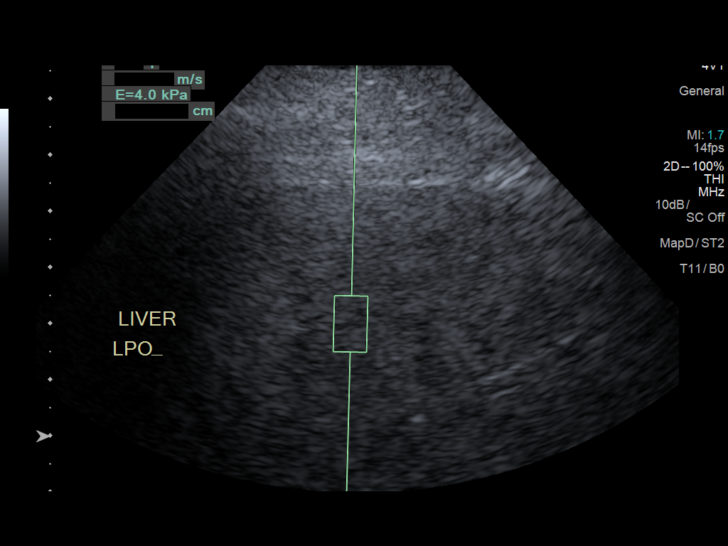
[im 7/26]
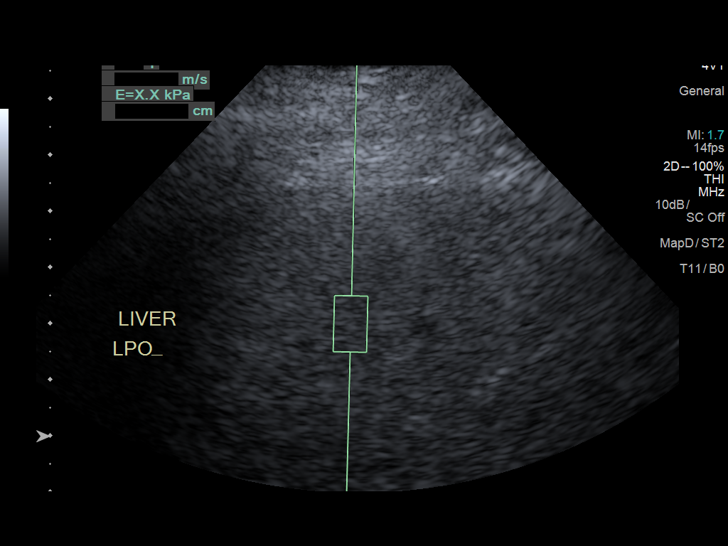
[im 9/26]
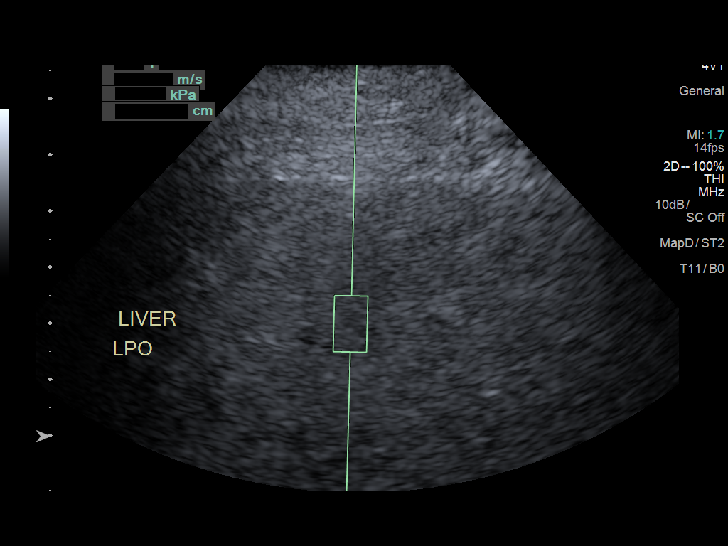
[im 11/26]
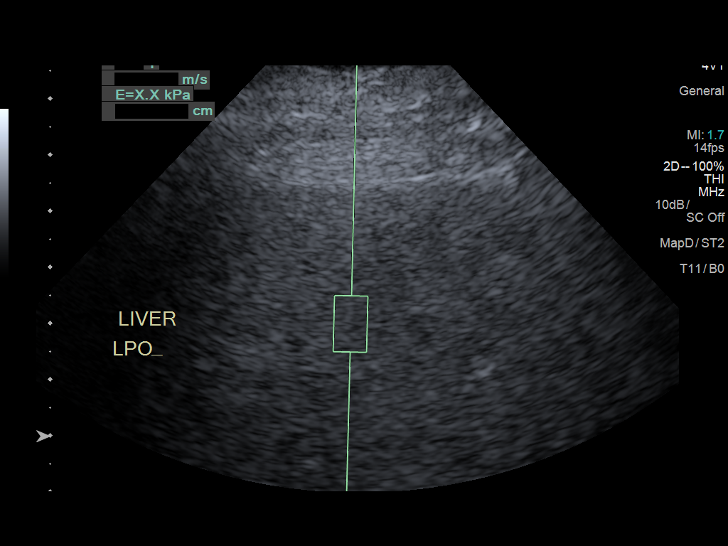
[im 13/26]
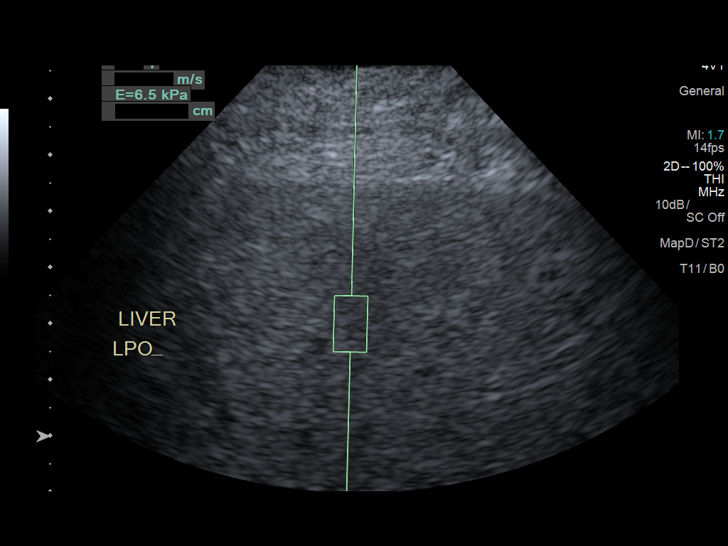
[im 15/26]
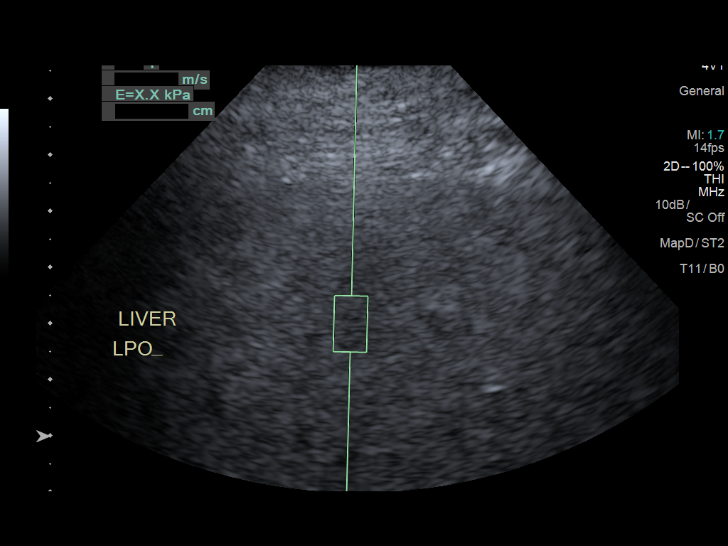
[im 17/26]
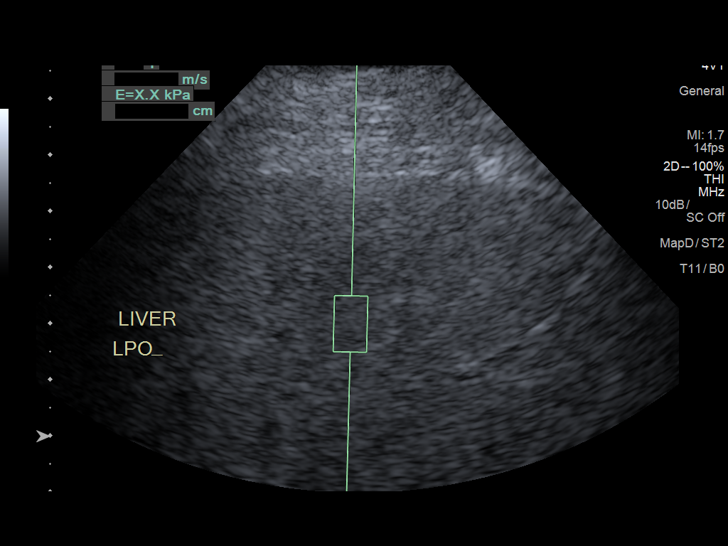
[im 19/26]
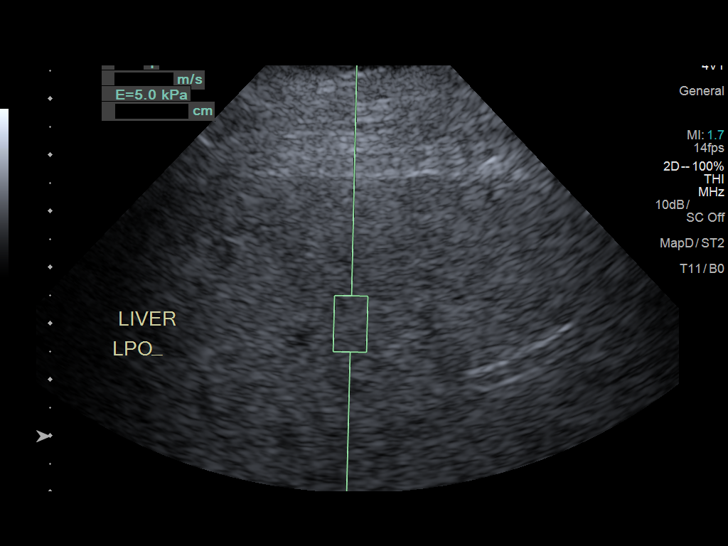
[im 21/26]
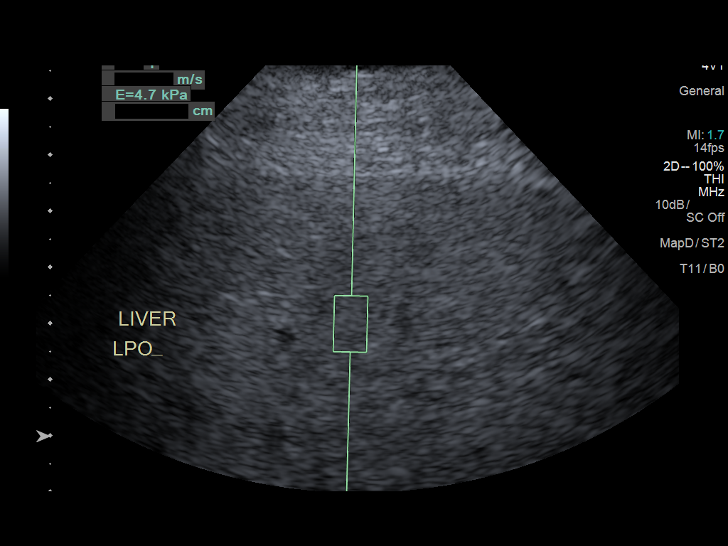
[im 23/26]
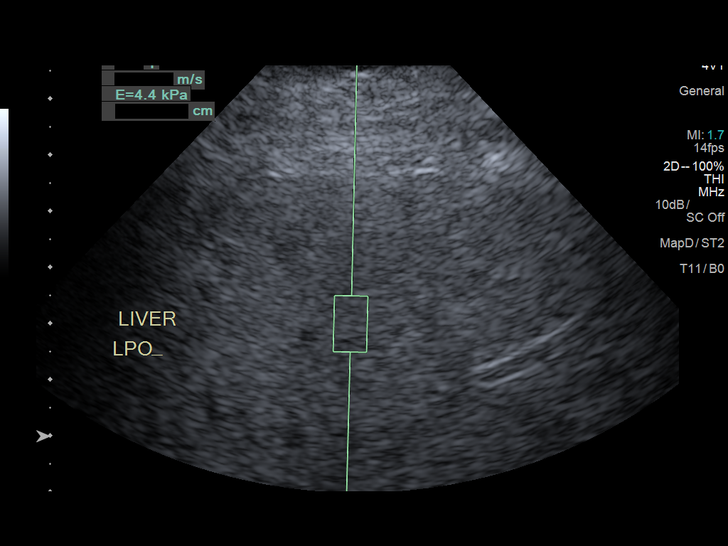
[im 26/26]
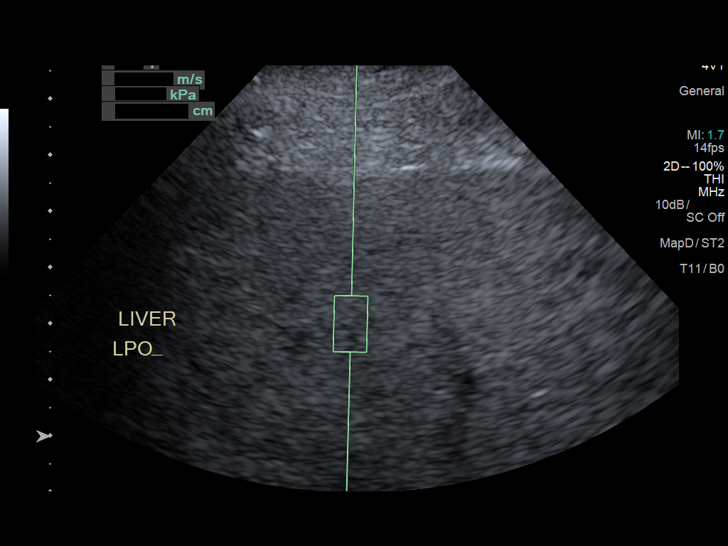

[13 of 25 positions shown; findings below may reference images not displayed]

FINDINGS: ULTRASOUND ABDOMEN

Gallbladder: No gallstones or wall thickening visualized. No
sonographic Murphy sign noted by sonographer.

Common bile duct: Diameter: Normal, 4 mm.

Liver: No focal lesion identified. Within normal limits in
parenchymal echogenicity.

IVC: No abnormality visualized.

Pancreas: Visualized portion unremarkable.

Spleen: Size and appearance within normal limits.

Right Kidney: Length: 9.8 cm. Echogenicity within normal limits. No
mass or hydronephrosis visualized.

Left Kidney: Length: 9.8 cm. Echogenicity within normal limits. No
mass or hydronephrosis visualized.

Abdominal aorta: No aneurysm visualized.

Other findings: No ascites.

ULTRASOUND HEPATIC ELASTOGRAPHY

Device: Siemens Helix VTQ

Patient position: Oblique

Transducer 4V1

Number of measurements: 10

Hepatic segment:  8

Median velocity:   1.60  m/sec

IQR:

IQR/Median velocity ratio:

Corresponding Metavir fibrosis score:  F2 + some F3

Risk of fibrosis: Moderate

Limitations of exam: None

Pertinent findings noted on other imaging exams:  None

Please note that abnormal shear wave velocities may also be
identified in clinical settings other than with hepatic fibrosis,
such as: acute hepatitis, elevated right heart and central venous
pressures including use of beta blockers, Nutvish disease
(Surrun), infiltrative processes such as
mastocytosis/amyloidosis/infiltrative tumor, extrahepatic
cholestasis, in the post-prandial state, and liver transplantation.
Correlation with patient history, laboratory data, and clinical
condition recommended.
IMPRESSION: ULTRASOUND ABDOMEN:
Normal abdominal ultrasound.

ULTRASOUND HEPATIC ELASTOGRAPHY:

Median hepatic shear wave velocity is calculated at 1.60 m/sec.

Corresponding Metavir fibrosis score is  F2 + some F3.

Risk of fibrosis is Moderate.

Follow-up: Additional testing appropriate

## 2018-04-22 ENCOUNTER — Other Ambulatory Visit: Payer: Self-pay | Admitting: Internal Medicine

## 2018-04-22 DIAGNOSIS — B181 Chronic viral hepatitis B without delta-agent: Secondary | ICD-10-CM

## 2018-05-04 MED FILL — VEMLIDY 25 MG TABLET: 25 | 30 days supply | Qty: 30 | Fill #0

## 2018-05-13 ENCOUNTER — Ambulatory Visit: Payer: Medicare Other | Admitting: Internal Medicine

## 2018-05-13 ENCOUNTER — Ambulatory Visit (INDEPENDENT_AMBULATORY_CARE_PROVIDER_SITE_OTHER): Payer: Medicare Other | Admitting: Pharmacist

## 2018-05-13 ENCOUNTER — Other Ambulatory Visit: Payer: Self-pay

## 2018-05-13 DIAGNOSIS — B181 Chronic viral hepatitis B without delta-agent: Secondary | ICD-10-CM

## 2018-05-13 MED ORDER — TENOFOVIR ALAFENAMIDE FUMARATE 25 MG PO TABS: 1.0000 | ORAL_TABLET | Freq: Every day | ORAL | 5 refills | Status: DC

## 2018-05-13 NOTE — Progress Notes (Signed)
HPI: Kevin Hutchinson is a 84 y.o. male who presents to the [facility] pharmacy clinic for Hepatitis B follow-up.  Patient Active Problem List   Diagnosis Date Noted  . New onset type 2 diabetes mellitus (HCC) 12/11/2017  . Seasonal allergies 06/05/2017  . Elevated PSA 06/05/2017  . PCP NOTES >>>>>>>>>>>>>>. 03/21/2016  . Hearing loss 03/16/2015  . Pain in joint, shoulder region 06/28/2013  . Annual physical exam 01/01/2011  . BPH (benign prostatic hyperplasia) 01/01/2011  . DIZZINESS 05/16/2009  . Dyslipidemia 06/15/2007  . Hepatitis B 06/15/2007  . GERD 03/12/2007  . 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 BACK PAIN 02/16/2007    Patient's Medications  New Prescriptions   No medications on file  Previous Medications   FENOFIBRATE 54 MG TABLET    Take 1 tablet (54 mg total) by mouth daily.   METFORMIN (GLUCOPHAGE) 500 MG TABLET    Take 1 tablet (500 mg total) by mouth 2 (two) times daily with a meal.   MULTIPLE VITAMINS-MINERALS (MULTIVITAMIN ADULT PO)    Take by mouth.   PANTOPRAZOLE (PROTONIX) 40 MG TABLET    Take 1 tablet (40 mg total) by mouth daily before breakfast.   TAMSULOSIN (FLOMAX) 0.4 MG CAPS CAPSULE    Take 1 capsule (0.4 mg total) by mouth daily after supper.  Modified Medications   Modified Medication Previous Medication   TENOFOVIR ALAFENAMIDE FUMARATE (VEMLIDY) 25 MG TABS VEMLIDY 25 MG TABS      Take 1 tablet (25 mg total) by mouth daily.    TAKE 1 TABLET BY MOUTH ONCE DAILY  Discontinued Medications   No medications on file    Allergies: No Known Allergies  Past Medical History: Past Medical History:  Diagnosis Date  . GERD (gastroesophageal reflux disease)    EGD 5-09: : Duodenitis without Hemorrhage, Esophageal Stricture.  . Hepatitis B   . Hyperlipidemia   . PPD positive    Was Rx INH x 3 months (2008)--HD      Social History: Social History   Socioeconomic History  . Marital status: Married    Spouse name: Not on file  . Number of children: 5  . Years of  education: Not on file  . Highest education level: Not on file  Occupational History  . Occupation: n/a  Social Needs  . Financial resource strain: Not on file  . Food insecurity:    Worry: Not on file    Inability: Not on file  . Transportation needs:    Medical: Not on file    Non-medical: Not on file  Tobacco Use  . Smoking status: Former Games developer  . Smokeless tobacco: Never Used  . Tobacco comment: quit in the 90s  Substance and Sexual Activity  . Alcohol use: Yes    Comment: RARE  . Drug use: No  . Sexual activity: Not on file  Lifestyle  . Physical activity:    Days per week: Not on file    Minutes per session: Not on file  . Stress: Not on file  Relationships  . Social connections:    Talks on phone: Not on file    Gets together: Not on file    Attends religious service: Not on file    Active member of club or organization: Not on file    Attends meetings of clubs or organizations: Not on file    Relationship status: Not on file  Other Topics Concern  . Not on file  Social History Narrative  Household: Lives w/ wife only   FROM [place]    Labs: No results found for: HIV1RNAQUANT, HIV1RNAVL, CD4TABS  RPR and STI No results found for: LABRPR, RPRTITER  No flowsheet data found.  Hepatitis B Lab Results  Component Value Date   HEPBSAB NON-REACTIVE 11/12/2017   HEPBSAG REACTIVE (A) 02/27/2017   HEPBCAB POS (A) 07/08/2012   Hepatitis C No results found for: HEPCAB, HCVRNAPCRQN Hepatitis A Lab Results  Component Value Date   HAV REACTIVE (A) 08/30/2014   Lipids: Lab Results  Component Value Date   CHOL 209 (H) 12/04/2017   TRIG 1,123.0 (H) 12/04/2017   HDL 30.30 (L) 12/04/2017   CHOLHDL 7 12/04/2017   VLDL 46.4 (H) 06/24/2012   LDLCALC 90 09/08/2009    Current Regimen: Vemlidy  Assessment: Kevin Hutchinson is here today to see Dr. Drue Second and follow-up for his chronic Hepatitis B infection.  He continues taking Vemlidy every day and states that he does  not miss any doses.  He just arrived back from [place] and had enough medication while he was away.  He does state that he is experiencing some gas, dry mouth, and back pain to which I referred him to discuss these with his primary care doctor. No nausea, diarrhea, or issues with Vemlidy.   He did have an abdominal ultrasound done back in September that did not show any signs of HCC. He gets his refills on time from [facility]. No other issues or questions today.  I explained his recent labs to him and will recheck labs today.  Will have him see Dr. Drue Second again in 6 months.  Plan: - Continue Vemlidy PO once daily - Hepatitis B DNA, CMET, CBC, Hepatitis B surface antibody today - F/u with Dr. Drue Second 9/16 at 845am  Kevin Hutchinson L. Kobe Jansma, PharmD, BCIDP, AAHIVP, CPP Infectious Diseases Clinical Pharmacist [facility] 05/13/2018, 2:34 PM

## 2018-05-14 MED FILL — VEMLIDY 25 MG TABLET: 25 | 30 days supply | Qty: 30 | Fill #1

## 2018-05-14 NOTE — Progress Notes (Signed)
Just FYI - on Vemlidy for chronic Hep B. SCr is up a tad.

## 2018-05-21 LAB — HEPATITIS B SURFACE ANTIBODY,QUALITATIVE: Hep B S Ab: NONREACTIVE

## 2018-05-21 LAB — CBC
HCT: 45.8 % (ref 38.5–50.0)
Hemoglobin: 15.3 g/dL (ref 13.2–17.1)
MCH: 25.4 pg — ABNORMAL LOW (ref 27.0–33.0)
MCHC: 33.4 g/dL (ref 32.0–36.0)
MCV: 76 fL — ABNORMAL LOW (ref 80.0–100.0)
MPV: 11.1 fL (ref 7.5–12.5)
Platelets: 145 10*3/uL (ref 140–400)
RBC: 6.03 10*6/uL — ABNORMAL HIGH (ref 4.20–5.80)
RDW: 14.3 % (ref 11.0–15.0)
WBC: 8.2 10*3/uL (ref 3.8–10.8)

## 2018-05-21 LAB — HEPATITIS B DNA, ULTRAQUANTITATIVE, PCR

## 2018-05-21 LAB — COMPREHENSIVE METABOLIC PANEL
AG Ratio: 1.4 (calc) (ref 1.0–2.5)
ALT: 21 U/L (ref 9–46)
AST: 19 U/L (ref 10–35)
Albumin: 4.6 g/dL (ref 3.6–5.1)
Alkaline phosphatase (APISO): 62 U/L (ref 35–144)
BUN / CREAT RATIO: 13 (calc) (ref 6–22)
BUN: 17 mg/dL (ref 7–25)
CO2: 27 mmol/L (ref 20–32)
CREATININE: 1.27 mg/dL — AB (ref 0.70–1.11)
Calcium: 9.4 mg/dL (ref 8.6–10.3)
Chloride: 99 mmol/L (ref 98–110)
GLUCOSE: 189 mg/dL — AB (ref 65–99)
Globulin: 3.2 g/dL (calc) (ref 1.9–3.7)
Potassium: 4.6 mmol/L (ref 3.5–5.3)
SODIUM: 137 mmol/L (ref 135–146)
Total Bilirubin: 0.6 mg/dL (ref 0.2–1.2)
Total Protein: 7.8 g/dL (ref 6.1–8.1)

## 2018-06-12 MED FILL — VEMLIDY 25 MG TABLET: 25 | 30 days supply | Qty: 30 | Fill #0

## 2018-06-19 ENCOUNTER — Telehealth: Payer: Self-pay | Admitting: Pharmacy Technician

## 2018-06-19 NOTE — Telephone Encounter (Signed)
[facility] Patient Advocate Encounter   I was successful in securing patient a $1 grant from [employer] (PA[employer]) to provide copayment coverage for [facility] .  This will make the out of pocket cost $0.     I have spoken with the patient.    The billing information is as follows and has been shared with [facility].   Fund: Hepatitis B Award Period: - 06/19/2019 Cardholder: [id number] BIN: [id number] PCN: [id number] Group: [id number] For pharmacy inquiries, contact PDMI at [phone]. For patient inquiries, contact PAF at [phone].  Patient knows to call the office with questions or concerns.  [name]. Dimas Aguas CPhT Specialty Pharmacy Patient [facility] Phone: [phone] Fax:  [phone]

## 2018-07-07 ENCOUNTER — Ambulatory Visit: Payer: Medicare Other | Admitting: Internal Medicine

## 2018-07-23 MED FILL — VEMLIDY 25 MG TABLET: 25 | 30 days supply | Qty: 30 | Fill #1

## 2018-08-27 MED FILL — VEMLIDY 25 MG TABLET: 25 | 30 days supply | Qty: 30 | Fill #2

## 2018-10-01 MED FILL — VEMLIDY 25 MG TABLET: 25 | 30 days supply | Qty: 30 | Fill #3

## 2018-10-29 MED FILL — VEMLIDY 25 MG TABLET: 25 | 30 days supply | Qty: 30 | Fill #4

## 2018-11-11 ENCOUNTER — Ambulatory Visit: Payer: Medicare Other | Admitting: Internal Medicine

## 2018-11-11 ENCOUNTER — Other Ambulatory Visit: Payer: Self-pay

## 2018-11-11 ENCOUNTER — Encounter: Payer: Self-pay | Admitting: Internal Medicine

## 2018-11-11 VITALS — BP 157/88 | HR 75 | Temp 98.4°F

## 2018-11-11 DIAGNOSIS — N182 Chronic kidney disease, stage 2 (mild): Secondary | ICD-10-CM

## 2018-11-11 DIAGNOSIS — Z23 Encounter for immunization: Secondary | ICD-10-CM

## 2018-11-11 DIAGNOSIS — B181 Chronic viral hepatitis B without delta-agent: Secondary | ICD-10-CM | POA: Diagnosis not present

## 2018-11-11 NOTE — Progress Notes (Signed)
RFV: follow up for chronic hep b  Patient ID: Kevin Hutchinson, male   DOB: 04-10-1934, 84 y.o.   MRN: [id number]  HPI 84yo M with chronic hepatitis B, DM, HLD. He has been doing well this past year. He continues to be in good health. He and his wife deny any close contact with covid + persons. No issues with taking his medication.  No n/v/diarrhea/no abdominal pain/no f/chills/nightsweats  Outpatient Encounter Medications as of 11/11/2018  Medication Sig  . Tenofovir Alafenamide Fumarate (VEMLIDY) 25 MG TABS Take 1 tablet (25 mg total) by mouth daily.  . fenofibrate 54 MG tablet Take 1 tablet (54 mg total) by mouth daily. (Patient not taking: Reported on 11/11/2018)  . metFORMIN (GLUCOPHAGE) 500 MG tablet Take 1 tablet (500 mg total) by mouth 2 (two) times daily with a meal. (Patient not taking: Reported on 11/11/2018)  . Multiple Vitamins-Minerals (MULTIVITAMIN ADULT PO) Take by mouth.  . pantoprazole (PROTONIX) 40 MG tablet Take 1 tablet (40 mg total) by mouth daily before breakfast. (Patient not taking: Reported on 11/11/2018)  . tamsulosin (FLOMAX) 0.4 MG CAPS capsule Take 1 capsule (0.4 mg total) by mouth daily after supper. (Patient not taking: Reported on 11/11/2018)   No facility-administered encounter medications on file as of 11/11/2018.      Patient Active Problem List   Diagnosis Date Noted  . New onset type 2 diabetes mellitus (HCC) 12/11/2017  . Seasonal allergies 06/05/2017  . Elevated PSA 06/05/2017  . PCP NOTES >>>>>>>>>>>>>>. 03/21/2016  . Hearing loss 03/16/2015  . Pain in joint, shoulder region 06/28/2013  . Annual physical exam 01/01/2011  . BPH (benign prostatic hyperplasia) 01/01/2011  . DIZZINESS 05/16/2009  . Dyslipidemia 06/15/2007  . Hepatitis B 06/15/2007  . GERD 03/12/2007  . 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 BACK PAIN 02/16/2007     Health Maintenance Due  Topic Date Due  . FOOT EXAM  08/14/1946  . OPHTHALMOLOGY EXAM  08/14/1946  . URINE MICROALBUMIN   08/14/1946  . HEMOGLOBIN A1C  06/05/2018  . INFLUENZA VACCINE  09/26/2018    Social History   Tobacco Use  . Smoking status: Former Games developermoker  . Smokeless tobacco: Never Used  . Tobacco comment: quit in the 90s  Substance Use Topics  . Alcohol use: Yes    Comment: RARE  . Drug use: No   Review of Systems  Constitutional: Negative for fever, chills, diaphoresis, activity change, appetite change, fatigue and unexpected weight change.  HENT: Negative for congestion, sore throat, rhinorrhea, sneezing, trouble swallowing and sinus pressure.  Eyes: Negative for photophobia and visual disturbance.  Respiratory: Negative for cough, chest tightness, shortness of breath, wheezing and stridor.  Cardiovascular: Negative for chest pain, palpitations and leg swelling.  Gastrointestinal: Negative for nausea, vomiting, abdominal pain, diarrhea, constipation, blood in stool, abdominal distention and anal bleeding.  Genitourinary: Negative for dysuria, hematuria, flank pain and difficulty urinating.  Musculoskeletal: Negative for myalgias, back pain, joint swelling, arthralgias and gait problem.  Skin: Negative for color change, pallor, rash and wound.  Neurological: Negative for dizziness, tremors, weakness and light-headedness.  Hematological: Negative for adenopathy. Does not bruise/bleed easily.  Psychiatric/Behavioral: Negative for behavioral problems, confusion, sleep disturbance, dysphoric mood, decreased concentration and agitation.    Physical Exam   BP (!) 157/88   Pulse 75   Temp 98.4 F (36.9 C) (Oral)   SpO2 98%   Physical Exam  Constitutional: He is oriented to person, place, and time. He appears well-developed and well-nourished. No  distress.  HENT:  Mouth/Throat: Oropharynx is clear and moist. No oropharyngeal exudate.  Cardiovascular: Normal rate, regular rhythm and normal heart sounds. Exam reveals no gallop and no friction rub.  No murmur heard.  Pulmonary/Chest: Effort  normal and breath sounds normal. No respiratory distress. He has no wheezes.  Abdominal: Soft. Bowel sounds are normal. He exhibits no distension. There is no tenderness.  Lymphadenopathy:  He has no cervical adenopathy.  Skin: Skin is warm and dry. No rash noted. No erythema.  Psychiatric: He has a normal mood and affect. His behavior is normal.    Lab Results  Component Value Date   HEPBSAB NON-REACTIVE 05/13/2018   No results found for: RPR, LABRPR  CBC Lab Results  Component Value Date   WBC 8.2 05/13/2018   RBC 6.03 (H) 05/13/2018   HGB 15.3 05/13/2018   HCT 45.8 05/13/2018   PLT 145 05/13/2018   MCV 76.0 (L) 05/13/2018   MCH 25.4 (L) 05/13/2018   MCHC 33.4 05/13/2018   RDW 14.3 05/13/2018   LYMPHSABS 3,681 02/27/2017   MONOABS 665 08/27/2016   EOSABS 238 02/27/2017    BMET Lab Results  Component Value Date   NA 137 05/13/2018   K 4.6 05/13/2018   CL 99 05/13/2018   CO2 27 05/13/2018   GLUCOSE 189 (H) 05/13/2018   BUN 17 05/13/2018   CREATININE 1.27 (H) 05/13/2018   CALCIUM 9.4 05/13/2018   GFRNONAA 67 11/12/2017   GFRAA 78 11/12/2017      Assessment and Plan aki = slight increase in cr function. Will recheck cr to see if need to change any medications  Health maintenance = Will give flu shot  Chronic hepatitis b = will plan to continue with vemlidy in addition to Check hep b labs and [place] of liver for hcc surveillance

## 2018-11-14 LAB — COMPLETE METABOLIC PANEL WITHOUT GFR
AG Ratio: 1.4 (calc) (ref 1.0–2.5)
ALT: 19 U/L (ref 9–46)
AST: 20 U/L (ref 10–35)
Albumin: 4.7 g/dL (ref 3.6–5.1)
Alkaline phosphatase (APISO): 61 U/L (ref 35–144)
BUN: 13 mg/dL (ref 7–25)
CO2: 30 mmol/L (ref 20–32)
Calcium: 9.9 mg/dL (ref 8.6–10.3)
Chloride: 100 mmol/L (ref 98–110)
Creat: 1.03 mg/dL (ref 0.70–1.11)
GFR, Est African American: 78 mL/min/1.73m2
GFR, Est Non African American: 67 mL/min/1.73m2
Globulin: 3.3 g/dL (ref 1.9–3.7)
Glucose, Bld: 175 mg/dL — ABNORMAL HIGH (ref 65–99)
Potassium: 4.8 mmol/L (ref 3.5–5.3)
Sodium: 140 mmol/L (ref 135–146)
Total Bilirubin: 0.5 mg/dL (ref 0.2–1.2)
Total Protein: 8 g/dL (ref 6.1–8.1)

## 2018-11-14 LAB — HEPATITIS B DNA, ULTRAQUANTITATIVE, PCR
Hepatitis B DNA (Calc): <1 Log IU/mL
Hepatitis B DNA: <10 IU/mL

## 2018-11-14 LAB — CBC WITH DIFFERENTIAL/PLATELET
Absolute Monocytes: 413 {cells}/uL (ref 200–950)
Basophils Absolute: 70 {cells}/uL (ref 0–200)
Basophils Relative: 1 %
Eosinophils Absolute: 231 {cells}/uL (ref 15–500)
Eosinophils Relative: 3.3 %
HCT: 47.7 % (ref 38.5–50.0)
Hemoglobin: 16 g/dL (ref 13.2–17.1)
Lymphs Abs: 2646 {cells}/uL (ref 850–3900)
MCH: 25.7 pg — ABNORMAL LOW (ref 27.0–33.0)
MCHC: 33.5 g/dL (ref 32.0–36.0)
MCV: 76.7 fL — ABNORMAL LOW (ref 80.0–100.0)
MPV: 11.9 fL (ref 7.5–12.5)
Monocytes Relative: 5.9 %
Neutro Abs: 3640 {cells}/uL (ref 1500–7800)
Neutrophils Relative %: 52 %
Platelets: 151 Thousand/uL (ref 140–400)
RBC: 6.22 Million/uL — ABNORMAL HIGH (ref 4.20–5.80)
RDW: 14.5 % (ref 11.0–15.0)
Total Lymphocyte: 37.8 %
WBC: 7 Thousand/uL (ref 3.8–10.8)

## 2018-11-14 LAB — AFP TUMOR MARKER: AFP-Tumor Marker: 4.1 ng/mL (ref <6.1)

## 2018-11-14 LAB — HEPATITIS B SURFACE ANTIBODY,QUALITATIVE: Hep B S Ab: NONREACTIVE

## 2018-11-18 ENCOUNTER — Other Ambulatory Visit: Payer: Self-pay

## 2018-11-18 ENCOUNTER — Ambulatory Visit (HOSPITAL_COMMUNITY)
Admission: RE | Admit: 2018-11-18 | Discharge: 2018-11-18 | Disposition: A | Discharge status: Home / Self Care | Payer: Medicare Other | Source: Ambulatory Visit | Attending: Internal Medicine | Admitting: Internal Medicine

## 2018-11-18 DIAGNOSIS — B181 Chronic viral hepatitis B without delta-agent: Secondary | ICD-10-CM | POA: Diagnosis not present

## 2018-11-27 MED FILL — VEMLIDY 25 MG TABLET: 25 | 30 days supply | Qty: 30 | Fill #5

## 2018-12-23 ENCOUNTER — Other Ambulatory Visit: Payer: Self-pay | Admitting: Pharmacist

## 2018-12-23 DIAGNOSIS — B181 Chronic viral hepatitis B without delta-agent: Secondary | ICD-10-CM

## 2018-12-25 MED FILL — VEMLIDY 25 MG TABLET: 25 | 30 days supply | Qty: 30 | Fill #0

## 2019-01-20 MED FILL — VEMLIDY 25 MG TABLET: 25 | 30 days supply | Qty: 30 | Fill #1

## 2019-02-16 MED FILL — VEMLIDY 25 MG TABLET: 25 | 30 days supply | Qty: 30 | Fill #2

## 2019-03-26 MED FILL — VEMLIDY 25 MG TABLET: 25 | 30 days supply | Qty: 30 | Fill #3

## 2019-03-28 ENCOUNTER — Ambulatory Visit: Payer: Medicare Other

## 2019-04-02 ENCOUNTER — Ambulatory Visit: Payer: Medicare Other | Attending: Internal Medicine

## 2019-04-02 ENCOUNTER — Ambulatory Visit: Payer: Medicare Other

## 2019-04-02 DIAGNOSIS — Z23 Encounter for immunization: Secondary | ICD-10-CM | POA: Insufficient documentation

## 2019-04-02 NOTE — Progress Notes (Signed)
   Covid-19 Vaccination Clinic  Name:  ETHON WYMER    MRN: [id number] DOB: January 01, 1935  04/02/2019  Mr. Sames was observed post Covid-19 immunization for 15 minutes without incidence. He was provided with Vaccine Information Sheet and instruction to access the V-Safe system.   Mr. Martinezlopez was instructed to call 911 with any severe reactions post vaccine: Marland Kitchen Difficulty breathing  . Swelling of your face and throat  . A fast heartbeat  . A bad rash all over your body  . Dizziness and weakness    Immunizations Administered    Name Date Dose VIS Date Route   Pfizer COVID-19 Vaccine 04/02/2019  9:09 AM 0.3 mL 02/05/2019 Intramuscular   Manufacturer: [employer], [employer]   Lot: [id number]   NDC: [address]-9

## 2019-04-04 ENCOUNTER — Ambulatory Visit: Payer: Medicare Other

## 2019-04-22 MED FILL — VEMLIDY 25 MG TABLET: 25 | 30 days supply | Qty: 30 | Fill #4

## 2019-04-27 ENCOUNTER — Ambulatory Visit: Payer: Medicare Other | Attending: Internal Medicine

## 2019-04-27 DIAGNOSIS — Z23 Encounter for immunization: Secondary | ICD-10-CM | POA: Insufficient documentation

## 2019-04-27 NOTE — Progress Notes (Signed)
   Covid-19 Vaccination Clinic  Name:  Kevin Hutchinson    MRN: [id number] DOB: [birth year]  04/27/2019  [name] was observed post Covid-19 immunization for 15 minutes without incident. He was provided with Vaccine Information Sheet and instruction to access the V-Safe system.   [name] was instructed to call 911 with any severe reactions post vaccine: Marland Kitchen Difficulty breathing  . Swelling of face and throat  . A fast heartbeat  . A bad rash all over body  . Dizziness and weakness   Immunizations Administered    Name Date Dose VIS Date Route   Pfizer COVID-19 Vaccine 04/27/2019  9:30 AM 0.3 mL 02/05/2019 Intramuscular   Manufacturer: [employer], [employer]   Lot: [id number]   NDC: [address]-4

## 2019-05-03 ENCOUNTER — Other Ambulatory Visit: Payer: Medicare Other

## 2019-05-03 ENCOUNTER — Other Ambulatory Visit: Payer: Self-pay

## 2019-05-03 ENCOUNTER — Encounter: Payer: Self-pay | Admitting: Internal Medicine

## 2019-05-03 ENCOUNTER — Ambulatory Visit (INDEPENDENT_AMBULATORY_CARE_PROVIDER_SITE_OTHER): Payer: Medicare Other | Admitting: Internal Medicine

## 2019-05-03 VITALS — BP 147/78 | HR 83 | Temp 97.3°F | Resp 16 | Ht 61.0 in | Wt 138.0 lb

## 2019-05-03 DIAGNOSIS — R7309 Other abnormal glucose: Secondary | ICD-10-CM | POA: Diagnosis not present

## 2019-05-03 DIAGNOSIS — R972 Elevated prostate specific antigen [PSA]: Secondary | ICD-10-CM | POA: Diagnosis not present

## 2019-05-03 DIAGNOSIS — Z0000 Encounter for general adult medical examination without abnormal findings: Secondary | ICD-10-CM

## 2019-05-03 DIAGNOSIS — E119 Type 2 diabetes mellitus without complications: Secondary | ICD-10-CM

## 2019-05-03 DIAGNOSIS — E785 Hyperlipidemia, unspecified: Secondary | ICD-10-CM | POA: Diagnosis not present

## 2019-05-03 LAB — LIPID PANEL
Cholesterol: 210 mg/dL — ABNORMAL HIGH (ref 0–200)
HDL: 30.9 mg/dL — ABNORMAL LOW (ref >39.00)
Total CHOL/HDL Ratio: 7
Triglycerides: 746 mg/dL — ABNORMAL HIGH (ref 0.0–149.0)

## 2019-05-03 LAB — TSH: TSH: 0.86 u[IU]/mL (ref 0.35–4.50)

## 2019-05-03 LAB — PSA: PSA: 5.02 ng/mL — ABNORMAL HIGH (ref 0.10–4.00)

## 2019-05-03 LAB — LDL CHOLESTEROL, DIRECT: Direct LDL: 66 mg/dL

## 2019-05-03 NOTE — Progress Notes (Signed)
Subjective:    Patient ID: Kevin Hutchinson, male    DOB: 05/15/1934, 85 y.o.   MRN: [id number]  DOS:  05/03/2019 Type of visit - description: Here for CPX, with virtual translation Last visit 11-2017 Chart reviewed, he sees infectious diseases.   Review of Systems   Denies chest pain or difficulty breathing. Denies dysuria or blood in the urine.  Some urinary frequency urgency, nocturiax 2 / night.  Other than above, a 14 point review of systems is negative    Past Medical History:  Diagnosis Date  . GERD (gastroesophageal reflux disease)    EGD 5-09: : Duodenitis without Hemorrhage, Esophageal Stricture.  . Hepatitis B   . Hyperlipidemia   . PPD positive    Was Rx INH x 3 months (2008)--HD     No family history on file.   Past Surgical History:  Procedure Laterality Date  . NO PAST SURGERIES      Allergies as of 05/03/2019   No Known Allergies     Medication List       Accurate as of May 03, 2019  9:26 AM. If you have any questions, ask your nurse or doctor.        fenofibrate 54 MG tablet Take 1 tablet (54 mg total) by mouth daily.   metFORMIN 500 MG tablet Commonly known as: GLUCOPHAGE Take 1 tablet (500 mg total) by mouth 2 (two) times daily with a meal.   MULTIVITAMIN ADULT PO Take by mouth.   pantoprazole 40 MG tablet Commonly known as: Protonix Take 1 tablet (40 mg total) by mouth daily before breakfast.   tamsulosin 0.4 MG Caps capsule Commonly known as: FLOMAX Take 1 capsule (0.4 mg total) by mouth daily after supper.   Vemlidy 25 MG Tabs Generic drug: Tenofovir Alafenamide Fumarate TAKE 1 TABLET (25 MG TOTAL) BY MOUTH DAILY.         Objective:   Physical Exam BP (!) 147/78 (BP Location: Left Arm, Patient Position: Sitting, Cuff Size: Small)   Pulse 83   Temp (!) 97.3 F (36.3 C) (Temporal)   Resp 16   Ht 5\' 1"  (1.549 m)   Wt 138 lb (62.6 kg)   SpO2 100%   BMI 26.07 kg/m  General: Well developed, NAD, BMI noted HEENT:   Normocephalic . Face symmetric, atraumatic Lungs:  CTA B Normal respiratory effort, no intercostal retractions, no accessory muscle use. Heart: RRR,  no murmur.  Abdomen:  Not distended, soft, non-tender. No rebound or rigidity.   Lower extremities: no pretibial edema bilaterally  DRE: Normal sphincter tone, no stools, prostate normal. Skin: Exposed areas without rash. Not pale. Not jaundice Neurologic:  alert & oriented X3.  Speech normal, gait appropriate for age and unassisted Strength symmetric and appropriate for age.  Psych: Cognition and judgment appear intact.  Cooperative with normal attention span and concentration.  Behavior appropriate. No anxious or depressed appearing.     Assessment     Assessment Anemia GERD: EGD 06-2007: duodenitis, esophageal stricture BPH, slightly increased PSA, declined to see urology 2015, 05/2017 Chronic hepatitis B H/o + PPD, INH 3 months 2008 [facility], s/p hearing assessment 11/30/2014: Severe hearing loss, has hearing aids   Plan: Here for CPX. Barriers: Although the translator did his best virtually, the communication was not fluent. Medication list: He is taking Vemlidy for sure and no other medication but is not sure of what it is: Protonix?  Flomax?  Metformin?  Fenofibrate? DM: Diagnosed 10-20 19, A1c  8.0, subsequently he left the country and I have not seen him since. Check a A1c, FLP. Chronic hepatitis B: Last visit with ID 10/2018. Increased PSA/BPH: PSA was 5.02 on October 2019, subsequently he left the country I have not seen him since.  DRE today is essentially normal, has minimal symptoms, recheck a PSA RTC 3 months    This visit occurred during the SARS-CoV-2 public health emergency.  Safety protocols were in place, including screening questions prior to the visit, additional usage of staff PPE, and extensive cleaning of exam room while observing appropriate contact time as indicated for disinfecting solutions.

## 2019-05-03 NOTE — Patient Instructions (Signed)
I will call you with your results.  I need to talk with somebody that can translate for asked  Think about having another colonoscopy.  GO TO THE LAB : Get the blood work     GO TO THE FRONT DESK Come back for a checkup in 3 months, please make an appointment

## 2019-05-03 NOTE — Progress Notes (Signed)
Pre visit review using our clinic review tool, if applicable. No additional management support is needed unless otherwise documented below in the visit note. 

## 2019-05-04 LAB — HEMOGLOBIN A1C
Hgb A1c MFr Bld: 8.4 % of total Hgb — ABNORMAL HIGH (ref <5.7)
Mean Plasma Glucose: 194 (calc)
eAG (mmol/L): 10.8 (calc)

## 2019-05-04 NOTE — Assessment & Plan Note (Signed)
Here for CPX. Barriers: Although the translator did his best virtually, the communication was not fluent. Medication list: He is taking Vemlidy for sure and no other medication but is not sure of what it is: Protonix?  Flomax?  Metformin?  Fenofibrate? DM: Diagnosed 10-20 19, A1c 8.0, subsequently he left the country and I have not seen him since. Check a A1c, FLP. Chronic hepatitis B: Last visit with ID 10/2018. Increased PSA/BPH: PSA was 5.02 on October 2019, subsequently he left the country I have not seen him since.  DRE today is essentially normal, has minimal symptoms, recheck a PSA RTC 3 months

## 2019-05-04 NOTE — Assessment & Plan Note (Signed)
-   Td--2011, booster to be discussed at the next CPX -PNM 23: 2009 , 2019 -prevnar 2015 - had zostavax before - shingrex: We will discuss at the next CPX - had a flu shot - pfizer/covid shot x 2  -CCS: Cscope 5-09 @ [place]: [facility].  I asked him to think about continue with CCS. -Prostate cancer screening: see BPH -Diet-exercise discussed  -Labs:  FLP, A1c, TSH, PSA

## 2019-05-11 ENCOUNTER — Telehealth: Payer: Self-pay | Admitting: Internal Medicine

## 2019-05-11 MED ORDER — METFORMIN HCL 850 MG PO TABS: 850.0000 mg | ORAL_TABLET | Freq: Three times a day (TID) | ORAL | 4 refills | Status: DC

## 2019-05-11 MED ORDER — FENOFIBRATE 54 MG PO TABS: 54.0000 mg | ORAL_TABLET | Freq: Every day | ORAL | 4 refills | Status: DC

## 2019-05-11 NOTE — Telephone Encounter (Signed)
Results reviewed. -PSA elevated but is stable after 1.5 years.  DRE was normal.  Recheck in few months. -DM: A1c increased to 8.5, triglycerides are elevated but less than before.  LDL very good. -During the visit he was not sure about what medications he was taking (Metformin, fenofibrate?) Plan: 1.  Metformin to 850 mg 3 times daily with meals.  Send a   prescription. 2.  Fenofibrate 54 mg daily, send a prescription -Please discuss above with a family member. RTC 3 months

## 2019-05-11 NOTE — Telephone Encounter (Signed)
Spoke w/ [name], [name]- informed of results and recommendations. [name] will inform Pt of results and medications. Metformin and fenofibrate sent to [facility].

## 2019-05-19 MED FILL — VEMLIDY 25 MG TABLET: 25 | 30 days supply | Qty: 30 | Fill #5

## 2019-05-24 ENCOUNTER — Ambulatory Visit: Payer: Medicare Other | Admitting: Internal Medicine

## 2019-05-24 ENCOUNTER — Other Ambulatory Visit: Payer: Self-pay

## 2019-06-11 ENCOUNTER — Other Ambulatory Visit: Payer: Self-pay | Admitting: Internal Medicine

## 2019-06-11 DIAGNOSIS — B181 Chronic viral hepatitis B without delta-agent: Secondary | ICD-10-CM

## 2019-06-14 ENCOUNTER — Encounter: Payer: Self-pay | Admitting: Internal Medicine

## 2019-06-14 ENCOUNTER — Other Ambulatory Visit: Payer: Self-pay

## 2019-06-14 ENCOUNTER — Ambulatory Visit: Payer: Medicare Other | Admitting: Internal Medicine

## 2019-06-14 VITALS — BP 145/73 | HR 68 | Temp 97.8°F | Ht 61.0 in | Wt 139.0 lb

## 2019-06-14 DIAGNOSIS — E781 Pure hyperglyceridemia: Secondary | ICD-10-CM

## 2019-06-14 DIAGNOSIS — B181 Chronic viral hepatitis B without delta-agent: Secondary | ICD-10-CM

## 2019-06-14 DIAGNOSIS — Z79899 Other long term (current) drug therapy: Secondary | ICD-10-CM | POA: Diagnosis not present

## 2019-06-14 NOTE — Progress Notes (Signed)
RFV: follow up for chronic hep b  Patient ID: Kevin Hutchinson, male   DOB: Aug 08, 1934, 85 y.o.   MRN: [id number]  HPI  84yo M chronic hepatitis B without hepatic coma, on vemlidy. Seen by his pcp early spring and started on metformin and fenofibrate for dm and hypertriglyceridemia, respectively. Doing well. Received both covid vacicne. He denies any abdominal pain, any episodes of jaundice, nor change in stool habits.  + hard of hearing. Has had hearing aids but he reports being irritating and thus not wearing them.  He had U/S in Sept that showed minimal fibrosis  Outpatient Encounter Medications as of 06/14/2019  Medication Sig  . fenofibrate 54 MG tablet Take 1 tablet (54 mg total) by mouth daily.  . metFORMIN (GLUCOPHAGE) 850 MG tablet Take 1 tablet (850 mg total) by mouth with breakfast, with lunch, and with evening meal.  . VEMLIDY 25 MG TABS TAKE 1 TABLET (25 MG TOTAL) BY MOUTH DAILY.   No facility-administered encounter medications on file as of 06/14/2019.     Patient Active Problem List   Diagnosis Date Noted  . New onset type 2 diabetes mellitus (HCC) 12/11/2017  . Seasonal allergies 06/05/2017  . Elevated PSA 06/05/2017  . PCP NOTES >>>>>>>>>>>>>>. 03/21/2016  . Hearing loss 03/16/2015  . Pain in joint, shoulder region 06/28/2013  . Annual physical exam 01/01/2011  . BPH (benign prostatic hyperplasia) 01/01/2011  . DIZZINESS 05/16/2009  . Dyslipidemia 06/15/2007  . Hepatitis B 06/15/2007  . GERD 03/12/2007  . 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 BACK PAIN 02/16/2007     Health Maintenance Due  Topic Date Due  . FOOT EXAM  Never done  . OPHTHALMOLOGY EXAM  Never done  . URINE MICROALBUMIN  Never done  . TETANUS/TDAP  03/14/2019    Soc hx: no smoking nor any etoh.  Review of Systems  Constitutional: Negative for fever, chills, diaphoresis, activity change, appetite change, fatigue and unexpected weight change.  HENT: Negative for congestion, sore throat, rhinorrhea,  sneezing, trouble swallowing and sinus pressure.  Eyes: Negative for photophobia and visual disturbance.  Respiratory: Negative for cough, chest tightness, shortness of breath, wheezing and stridor.  Cardiovascular: Negative for chest pain, palpitations and leg swelling.  Gastrointestinal: Negative for nausea, vomiting, abdominal pain, diarrhea, constipation, blood in stool, abdominal distention and anal bleeding.  Genitourinary: Negative for dysuria, hematuria, flank pain and difficulty urinating.  Musculoskeletal: Negative for myalgias, back pain, joint swelling, arthralgias and gait problem.  Skin: Negative for color change, pallor, rash and wound.  Neurological:+ hard of hearing. Negative for dizziness, tremors, weakness and light-headedness.  Hematological: Negative for adenopathy. Does not bruise/bleed easily.  Psychiatric/Behavioral: Negative for behavioral problems, confusion, sleep disturbance, dysphoric mood, decreased concentration and agitation.    Physical Exam   BP (!) 145/73   Pulse 68   Temp 97.8 F (36.6 C)   Ht 5\' 1"  (1.549 m)   Wt 139 lb (63 kg)   SpO2 97%   BMI 26.26 kg/m    Physical Exam  Constitutional: He is oriented to person, place, and time. He appears well-developed and well-nourished. No distress.  HENT:  Mouth/Throat: Oropharynx is clear and moist. No oropharyngeal exudate.  Cardiovascular: Normal rate, regular rhythm and normal heart sounds. Exam reveals no gallop and no friction rub.  No murmur heard.  Pulmonary/Chest: Effort normal and breath sounds normal. No respiratory distress. He has no wheezes.  Abdominal: Soft. Bowel sounds are normal. He exhibits no distension. There is no tenderness.  Lymphadenopathy:  He has no cervical adenopathy.  Neurological: He is alert and oriented to person, place, and time.  Skin: Skin is warm and dry. No rash noted. No erythema.  Psychiatric: He has a normal mood and affect. His behavior is normal.    Lab  Results  Component Value Date   HEPBSAB NON-REACTIVE 11/11/2018   No results found for: RPR, LABRPR  CBC Lab Results  Component Value Date   WBC 7.0 11/11/2018   RBC 6.22 (H) 11/11/2018   HGB 16.0 11/11/2018   HCT 47.7 11/11/2018   PLT 151 11/11/2018   MCV 76.7 (L) 11/11/2018   MCH 25.7 (L) 11/11/2018   MCHC 33.5 11/11/2018   RDW 14.5 11/11/2018   LYMPHSABS 2,646 11/11/2018   MONOABS 665 08/27/2016   EOSABS 231 11/11/2018    BMET Lab Results  Component Value Date   NA 140 11/11/2018   K 4.8 11/11/2018   CL 100 11/11/2018   CO2 30 11/11/2018   GLUCOSE 175 (H) 11/11/2018   BUN 13 11/11/2018   CREATININE 1.03 11/11/2018   CALCIUM 9.9 11/11/2018   GFRNONAA 67 11/11/2018   GFRAA 78 11/11/2018      Assessment and Plan  Chronic hep b without hepatic coma = continue on vemlidy. Will check labs to see virologic suppression and synthetic liver function. Will see back in 6 months  hypertrygliceridemia = his lipid profile shows that his TG in 700s. Recommend diet modification and continue on fenofibrate.   Long term medication management = will check kidney function  Communication done via interpreter who is present during visit

## 2019-06-15 MED FILL — VEMLIDY 25 MG TABLET: 25 | 30 days supply | Qty: 30 | Fill #0

## 2019-06-16 LAB — COMPREHENSIVE METABOLIC PANEL
AG Ratio: 1.6 (calc) (ref 1.0–2.5)
ALT: 22 U/L (ref 9–46)
AST: 21 U/L (ref 10–35)
Albumin: 4.9 g/dL (ref 3.6–5.1)
Alkaline phosphatase (APISO): 51 U/L (ref 35–144)
BUN/Creatinine Ratio: 14 (calc) (ref 6–22)
BUN: 17 mg/dL (ref 7–25)
CO2: 30 mmol/L (ref 20–32)
Calcium: 10 mg/dL (ref 8.6–10.3)
Chloride: 101 mmol/L (ref 98–110)
Creat: 1.2 mg/dL — ABNORMAL HIGH (ref 0.70–1.11)
Globulin: 3.1 g/dL (calc) (ref 1.9–3.7)
Glucose, Bld: 163 mg/dL — ABNORMAL HIGH (ref 65–99)
Potassium: 5 mmol/L (ref 3.5–5.3)
Sodium: 138 mmol/L (ref 135–146)
Total Bilirubin: 0.4 mg/dL (ref 0.2–1.2)
Total Protein: 8 g/dL (ref 6.1–8.1)

## 2019-06-16 LAB — CBC WITH DIFFERENTIAL/PLATELET
Absolute Monocytes: 431 cells/uL (ref 200–950)
Basophils Absolute: 58 cells/uL (ref 0–200)
Basophils Relative: 0.8 %
Eosinophils Absolute: 307 cells/uL (ref 15–500)
Eosinophils Relative: 4.2 %
HCT: 47.3 % (ref 38.5–50.0)
Hemoglobin: 16 g/dL (ref 13.2–17.1)
Lymphs Abs: 3460 cells/uL (ref 850–3900)
MCH: 26.3 pg — ABNORMAL LOW (ref 27.0–33.0)
MCHC: 33.8 g/dL (ref 32.0–36.0)
MCV: 77.8 fL — ABNORMAL LOW (ref 80.0–100.0)
MPV: 11.2 fL (ref 7.5–12.5)
Monocytes Relative: 5.9 %
Neutro Abs: 3044 cells/uL (ref 1500–7800)
Neutrophils Relative %: 41.7 %
Platelets: 148 10*3/uL (ref 140–400)
RBC: 6.08 10*6/uL — ABNORMAL HIGH (ref 4.20–5.80)
RDW: 14.4 % (ref 11.0–15.0)
Total Lymphocyte: 47.4 %
WBC: 7.3 10*3/uL (ref 3.8–10.8)

## 2019-06-16 LAB — PROTIME-INR
INR: 1
Prothrombin Time: 10.5 s (ref 9.0–11.5)

## 2019-06-16 LAB — HEPATITIS B DNA, ULTRAQUANTITATIVE, PCR
Hepatitis B DNA (Calc): <1 Log IU/mL
Hepatitis B DNA: <10 IU/mL

## 2019-06-16 LAB — HEPATITIS B E ANTIBODY: Hep B E Ab: REACTIVE — AB

## 2019-07-09 ENCOUNTER — Telehealth: Payer: Self-pay | Admitting: Pharmacy Technician

## 2019-07-09 NOTE — Telephone Encounter (Signed)
[facility] Patient Advocate Encounter  Received notification from [employer] that patient has been denied enrollment into their program to receive Vemlidy because the current disease state funds are closed. His coverage expired on 06/19/2019. No other assistance is available except [employer] Advancing Access but the patient has medicare.       Patient knows to call the office with questions or concerns.   [facility] will continue to follow and reapply when funds open.   Beulah Gandy, CPhT Specialty Pharmacy Patient [facility] for Infectious Disease Phone: [phone] Fax: [phone] 07/09/2019 10:53 AM

## 2019-07-13 NOTE — Telephone Encounter (Signed)
[facility] Patient Advocate Encounter  Patient will pick up 60 tablets today from the clinic. This will bridge the gap while waiting for Hepatitis B grant funds to open back up or the coverage gap amount to come down. Currently his copay is $328.36 and he is unable to afford on the fixed income. Pharmacy refill call has been retimed for 09/06/2019 for [facility] to check back in on status at that time.   We in the pharmacy department will need to keep an eye on the grant funds status and apply for this patient when they open up. He is household size 2, income $20,800, married status and retired. Current insurance is [employer] [phone].

## 2019-08-02 NOTE — Telephone Encounter (Signed)
[facility] Patient Advocate Encounter  Funds opened for Hepatitis B today, applied and patient is approved until 08/01/2020 for $4000.00 coverage.   Billing information:   Will be filled at [facility] once his other bottles of medication run out.   Beulah Gandy, CPhT Specialty Pharmacy Patient [facility] for Infectious Disease Phone: [phone] Fax: [phone] 08/02/2019 3:53 PM

## 2019-08-09 ENCOUNTER — Ambulatory Visit (INDEPENDENT_AMBULATORY_CARE_PROVIDER_SITE_OTHER): Payer: Medicare Other | Admitting: Internal Medicine

## 2019-08-09 ENCOUNTER — Encounter: Payer: Self-pay | Admitting: Internal Medicine

## 2019-08-09 ENCOUNTER — Other Ambulatory Visit: Payer: Medicare Other

## 2019-08-09 ENCOUNTER — Other Ambulatory Visit: Payer: Self-pay

## 2019-08-09 VITALS — BP 134/71 | HR 68 | Temp 96.9°F | Resp 16 | Ht 61.0 in | Wt 135.0 lb

## 2019-08-09 DIAGNOSIS — R7309 Other abnormal glucose: Secondary | ICD-10-CM

## 2019-08-09 DIAGNOSIS — R32 Unspecified urinary incontinence: Secondary | ICD-10-CM | POA: Diagnosis not present

## 2019-08-09 DIAGNOSIS — E785 Hyperlipidemia, unspecified: Secondary | ICD-10-CM

## 2019-08-09 DIAGNOSIS — R972 Elevated prostate specific antigen [PSA]: Secondary | ICD-10-CM

## 2019-08-09 DIAGNOSIS — E119 Type 2 diabetes mellitus without complications: Secondary | ICD-10-CM

## 2019-08-09 LAB — LIPID PANEL
Cholesterol: 212 mg/dL — ABNORMAL HIGH (ref 0–200)
HDL: 34.7 mg/dL — ABNORMAL LOW (ref >39.00)
LDL Cholesterol: 142 mg/dL — ABNORMAL HIGH (ref 0–99)
NonHDL: 177.25
Total CHOL/HDL Ratio: 6
Triglycerides: 177 mg/dL — ABNORMAL HIGH (ref 0.0–149.0)
VLDL: 35.4 mg/dL (ref 0.0–40.0)

## 2019-08-09 NOTE — Progress Notes (Signed)
Pre visit review using our clinic review tool, if applicable. No additional management support is needed unless otherwise documented below in the visit note. 

## 2019-08-09 NOTE — Progress Notes (Signed)
Subjective:    Patient ID: Kevin Hutchinson, male    DOB: Dec 25, 1934, 85 y.o.   MRN: [id number]  DOS:  08/09/2019 Type of visit - description: Follow-up, here with his nurse.  Translator was virtually present.  Today we talked about diabetes.  Other issues: -For hepatitis B, he sees ID regularly. -Talked about the elevated PSA.  When asked, reports no dysuria or gross hematuria but admits to urinary incontinence described as unable to reach the restroom dry.  He urinates before he gets there. He also reports mild low back pain for a while with no radiation. No bowel incontinence. No nausea, vomiting, diarrhea.  Review of Systems See above   Past Medical History:  Diagnosis Date  . GERD (gastroesophageal reflux disease)    EGD 5-09: : Duodenitis without Hemorrhage, Esophageal Stricture.  . Hepatitis B   . Hyperlipidemia   . PPD positive    Was Rx INH x 3 months (2008)--HD      Past Surgical History:  Procedure Laterality Date  . NO PAST SURGERIES      Allergies as of 08/09/2019   No Known Allergies     Medication List       Accurate as of August 09, 2019 10:36 AM. If you have any questions, ask your nurse or doctor.        fenofibrate 54 MG tablet Take 1 tablet (54 mg total) by mouth daily.   metFORMIN 850 MG tablet Commonly known as: Glucophage Take 1 tablet (850 mg total) by mouth with breakfast, with lunch, and with evening meal.   Vemlidy 25 MG Tabs Generic drug: Tenofovir Alafenamide Fumarate TAKE 1 TABLET (25 MG TOTAL) BY MOUTH DAILY.          Objective:   Physical Exam BP 134/71 (BP Location: Left Arm, Patient Position: Sitting, Cuff Size: Small)   Pulse 68   Temp (!) 96.9 F (36.1 C) (Temporal)   Resp 16   Ht 5\' 1"  (1.549 m)   Wt 135 lb (61.2 kg)   SpO2 99%   BMI 25.51 kg/m   General:   Well developed, NAD, BMI noted.  HEENT:  Normocephalic . Face symmetric, atraumatic Lungs:  CTA B Normal respiratory effort, no intercostal  retractions, no accessory muscle use. Heart: RRR,  no murmur.  Abdomen:  Not distended, soft, non-tender. No rebound or rigidity.   Skin: Not pale. Not jaundice Lower extremities: no pretibial edema bilaterally  Neurologic:  alert & oriented X3.  Speech normal, gait appropriate for age and unassisted Psych--  Cognition and judgment appear intact.  Cooperative with normal attention span and concentration.  Behavior appropriate. No anxious or depressed appearing.     Assessment       Assessment DM Anemia GERD: EGD 06-2007: duodenitis, esophageal stricture BPH, slightly increased PSA, declined to see urology 2015, 05/2017 Chronic hepatitis B H/o + PPD, INH 3 months 2008 [facility], s/p hearing assessment 11/30/2014: Severe hearing loss, has hearing aids   Plan: Barriers: 01/30/2015 was present, communication was satisfactory DM: Reportedly takes Metformin, check A1c, last LFTs normal BPH, increased PSA: Last PSA stable but is still elevated, today he reports incontinence, referr to urology. Chronic hep B: Per infectious diseases.  Last visit 05-2019 Future communications: We will contact his Dickson 06-01-1974. RTC 3 months     This visit occurred during the SARS-CoV-2 public health emergency.  Safety protocols were in place, including screening questions prior to the visit, additional usage of staff PPE, and  extensive cleaning of exam room while observing appropriate contact time as indicated for disinfecting solutions.

## 2019-08-09 NOTE — Patient Instructions (Addendum)
Please schedule Medicare Wellness with Lawanna Kobus.   Per our records you are due for an eye exam. Please contact your eye doctor to schedule an appointment. Please have them send copies of your office visit notes to [place]. Our fax number is [phone].    GO TO THE LAB : Get the blood work     GO TO THE FRONT DESK, PLEASE SCHEDULE YOUR APPOINTMENTS Come back for a checkup in 3 months

## 2019-08-10 LAB — HEMOGLOBIN A1C
Hgb A1c MFr Bld: 6.7 % of total Hgb — ABNORMAL HIGH (ref <5.7)
Mean Plasma Glucose: 146 (calc)
eAG (mmol/L): 8.1 (calc)

## 2019-08-10 NOTE — Assessment & Plan Note (Signed)
Barriers: Advertising account executive was present, communication was satisfactory DM: Reportedly takes Metformin, check A1c, last LFTs normal BPH, increased PSA: Last PSA stable but is still elevated, today he reports incontinence, referr to urology. Chronic hep B: Per infectious diseases.  Last visit 05-2019 Future communications: We will contact his Roch [name]. RTC 3 months

## 2019-09-14 ENCOUNTER — Other Ambulatory Visit: Payer: Self-pay | Admitting: Internal Medicine

## 2019-10-07 MED FILL — VEMLIDY 25 MG TABLET: 25 | 30 days supply | Qty: 30 | Fill #1

## 2019-11-03 DIAGNOSIS — H40033 Anatomical narrow angle, bilateral: Secondary | ICD-10-CM | POA: Diagnosis not present

## 2019-11-03 DIAGNOSIS — H43393 Other vitreous opacities, bilateral: Secondary | ICD-10-CM | POA: Diagnosis not present

## 2019-11-03 MED FILL — VEMLIDY 25 MG TABLET: 25 | 30 days supply | Qty: 30 | Fill #2

## 2019-11-15 ENCOUNTER — Encounter: Payer: Self-pay | Admitting: Internal Medicine

## 2019-11-15 ENCOUNTER — Other Ambulatory Visit: Payer: Self-pay

## 2019-11-15 ENCOUNTER — Ambulatory Visit (INDEPENDENT_AMBULATORY_CARE_PROVIDER_SITE_OTHER): Payer: Medicare Other | Admitting: Internal Medicine

## 2019-11-15 VITALS — BP 147/82 | HR 65 | Temp 97.5°F | Resp 16 | Ht 61.0 in | Wt 138.2 lb

## 2019-11-15 DIAGNOSIS — Z23 Encounter for immunization: Secondary | ICD-10-CM

## 2019-11-15 DIAGNOSIS — E785 Hyperlipidemia, unspecified: Secondary | ICD-10-CM

## 2019-11-15 DIAGNOSIS — E119 Type 2 diabetes mellitus without complications: Secondary | ICD-10-CM

## 2019-11-15 MED ORDER — EZETIMIBE 10 MG PO TABS: 10.0000 mg | ORAL_TABLET | Freq: Every day | ORAL | 3 refills | Status: DC

## 2019-11-15 MED ORDER — METFORMIN HCL 850 MG PO TABS: 850.0000 mg | ORAL_TABLET | Freq: Three times a day (TID) | ORAL | 3 refills | Status: DC

## 2019-11-15 MED ORDER — TETANUS-DIPHTH-ACELL PERTUSSIS 5-2.5-18.5 LF-MCG/0.5 IM SUSP: 0.5000 mL | Freq: Once | INTRAMUSCULAR | 0 refills | Status: AC

## 2019-11-15 NOTE — Progress Notes (Signed)
Pre visit review using our clinic review tool, if applicable. No additional management support is needed unless otherwise documented below in the visit note. 

## 2019-11-15 NOTE — Patient Instructions (Addendum)
Per our records you are due for an eye exam. Please contact your eye doctor to schedule an appointment. Please have them send copies of your office visit notes to [place]. Our fax number is [phone].  We are adding a medication called Zetia to your regimen to help your cholesterol   GO TO THE LAB : Get the blood work     GO TO THE FRONT DESK, PLEASE SCHEDULE YOUR APPOINTMENTS Come back for a checkup in 4 months

## 2019-11-15 NOTE — Progress Notes (Signed)
   Subjective:    Patient ID: JAKAIDEN FILL, male    DOB: 1934-10-15, 85 y.o.   MRN: [id number]  DOS:  11/15/2019 Type of visit - description: Follow-up Today with talk about diabetes, high cholesterol preventative care. He is here with his wife, we have interpreter  Review of Systems Denies any major problems  Past Medical History:  Diagnosis Date  . GERD (gastroesophageal reflux disease)    EGD 5-09: : Duodenitis without Hemorrhage, Esophageal Stricture.  . Hepatitis B   . Hyperlipidemia   . PPD positive    Was Rx INH x 3 months (2008)--HD      Past Surgical History:  Procedure Laterality Date  . NO PAST SURGERIES      Allergies as of 11/15/2019   No Known Allergies     Medication List       Accurate as of November 15, 2019  9:57 AM. If you have any questions, ask your nurse or doctor.        fenofibrate 54 MG tablet Take 1 tablet (54 mg total) by mouth daily.   metFORMIN 850 MG tablet Commonly known as: Glucophage Take 1 tablet (850 mg total) by mouth with breakfast, with lunch, and with evening meal.   Vemlidy 25 MG Tabs Generic drug: Tenofovir Alafenamide Fumarate TAKE 1 TABLET (25 MG TOTAL) BY MOUTH DAILY.          Objective:   Physical Exam BP (!) 147/82 (BP Location: Left Arm, Patient Position: Sitting, Cuff Size: Small)   Pulse 65   Temp (!) 97.5 F (36.4 C) (Oral)   Resp 16   Ht 5\' 1"  (1.549 m)   Wt 138 lb 4 oz (62.7 kg)   SpO2 99%   BMI 26.12 kg/m  General:   Well developed, NAD, BMI noted. HEENT:  Normocephalic . Face symmetric, atraumatic Lungs:  CTA B Normal respiratory effort, no intercostal retractions, no accessory muscle use. Heart: RRR,  no murmur.  DM foot exam: Normal pinprick examination, good pedal pulses. Skin: Not pale. Not jaundice Neurologic:  alert & oriented X3.  Speech normal, gait appropriate for age and unassisted Psych--  Cognition and judgment appear intact.  Cooperative with normal attention span and  concentration.  Behavior appropriate. No anxious or depressed appearing.      Assessment     Assessment DM Anemia GERD: EGD 06-2007: duodenitis, esophageal stricture BPH, slightly increased PSA, declined to see urology 2015, 05/2017 Chronic hepatitis B H/o + PPD, INH 3 months 2008 [facility], s/p hearing assessment 11/30/2014: Severe hearing loss, has hearing aids   Plan: DM: Unclear if he is taking Metformin, apparently not, rx sent, encouraged compliance. Feet exam negative. Check CMP, A1c Dyslipidemia: Last LDL 142, on fenofibrate, I am hesitant to start statins due to history of chronic hepatitis B. start Zetia Chronic hep B: On vemlidy Increase PSA: Apparently has not pursue urology referral.  Reassess on RTC Preventive care: Flu shot today, Tdap printed. Had Covid vaccinations Barriers: Interpreter was present virtually, he was very helpful. I updated his contact information, added the daughter in law  RTC 4 months   This visit occurred during the SARS-CoV-2 public health emergency.  Safety protocols were in place, including screening questions prior to the visit, additional usage of staff PPE, and extensive cleaning of exam room while observing appropriate contact time as indicated for disinfecting solutions.

## 2019-11-16 LAB — COMPREHENSIVE METABOLIC PANEL
AG Ratio: 1.6 (calc) (ref 1.0–2.5)
ALT: 15 U/L (ref 9–46)
AST: 18 U/L (ref 10–35)
Albumin: 4.7 g/dL (ref 3.6–5.1)
Alkaline phosphatase (APISO): 51 U/L (ref 35–144)
BUN/Creatinine Ratio: 17 (calc) (ref 6–22)
BUN: 23 mg/dL (ref 7–25)
CO2: 29 mmol/L (ref 20–32)
Calcium: 9.7 mg/dL (ref 8.6–10.3)
Chloride: 101 mmol/L (ref 98–110)
Creat: 1.33 mg/dL — ABNORMAL HIGH (ref 0.70–1.11)
Globulin: 3 g/dL (calc) (ref 1.9–3.7)
Glucose, Bld: 170 mg/dL — ABNORMAL HIGH (ref 65–99)
Potassium: 5.2 mmol/L (ref 3.5–5.3)
Sodium: 138 mmol/L (ref 135–146)
Total Bilirubin: 0.5 mg/dL (ref 0.2–1.2)
Total Protein: 7.7 g/dL (ref 6.1–8.1)

## 2019-11-16 LAB — HEMOGLOBIN A1C
Hgb A1c MFr Bld: 8.2 % of total Hgb — ABNORMAL HIGH (ref <5.7)
Mean Plasma Glucose: 189 (calc)
eAG (mmol/L): 10.4 (calc)

## 2019-11-16 NOTE — Assessment & Plan Note (Signed)
DM: Unclear if he is taking Metformin, apparently not, rx sent, encouraged compliance. Feet exam negative. Check CMP, A1c Dyslipidemia: Last LDL 142, on fenofibrate, I am hesitant to start statins due to history of chronic hepatitis B. start Zetia Chronic hep B: On vemlidy Increase PSA: Apparently has not pursue urology referral.  Reassess on RTC Preventive care: Flu shot today, Tdap printed. Had Covid vaccinations Barriers: Interpreter was present virtually, he was very helpful. I updated his contact information, added the daughter in law  RTC 4 months

## 2019-11-18 MED ORDER — METFORMIN HCL 500 MG PO TABS: 500.0000 mg | ORAL_TABLET | Freq: Two times a day (BID) | ORAL | 4 refills | Status: DC

## 2019-11-18 NOTE — Addendum Note (Signed)
Addended byConrad Opdyke D on: 11/18/2019 02:29 PM   Modules accepted: Orders

## 2019-11-30 MED FILL — VEMLIDY 25 MG TABLET: 25 | 30 days supply | Qty: 30 | Fill #3

## 2019-12-15 ENCOUNTER — Ambulatory Visit: Payer: Medicare Other | Admitting: Internal Medicine

## 2020-01-03 MED FILL — VEMLIDY 25 MG TABLET: 25 | 30 days supply | Qty: 30 | Fill #4

## 2020-01-17 ENCOUNTER — Ambulatory Visit (INDEPENDENT_AMBULATORY_CARE_PROVIDER_SITE_OTHER): Payer: Medicare Other | Admitting: Internal Medicine

## 2020-01-17 ENCOUNTER — Other Ambulatory Visit: Payer: Self-pay

## 2020-01-17 VITALS — BP 115/69 | HR 71 | Temp 98.0°F | Wt 140.0 lb

## 2020-01-17 DIAGNOSIS — Z79899 Other long term (current) drug therapy: Secondary | ICD-10-CM

## 2020-01-17 DIAGNOSIS — B181 Chronic viral hepatitis B without delta-agent: Secondary | ICD-10-CM

## 2020-01-17 MED ORDER — VEMLIDY 25 MG PO TABS: ORAL_TABLET | ORAL | 11 refills | Status: DC

## 2020-01-17 NOTE — Progress Notes (Signed)
RFV: follow up for chronic hep B  Patient ID: Kevin Hutchinson, male   DOB: [birth year], 85 y.o.   MRN: [id number]  HPI [name] isa 85yo M with history of chronic hepatitis B, also is hard of hearing.Had a break for 1 month in July without medications- was without refills. He is now back to being on his medication. Did not notice any difference while being off of meds. He is doing well. No complaints to his health.Has an itchy spot on right thigh- dry skin  ROS:  Occasional heartburn with potato    Health maintenance =  Has had flu vaccine plus covid booster    Outpatient Encounter Medications as of 01/17/2020  Medication Sig  . ezetimibe (ZETIA) 10 MG tablet Take 1 tablet (10 mg total) by mouth daily.  . fenofibrate 54 MG tablet Take 1 tablet (54 mg total) by mouth daily.  . metFORMIN (GLUCOPHAGE) 500 MG tablet Take 1 tablet (500 mg total) by mouth 2 (two) times daily with a meal.  . VEMLIDY 25 MG TABS TAKE 1 TABLET (25 MG TOTAL) BY MOUTH DAILY.   No facility-administered encounter medications on file as of 01/17/2020.     Patient Active Problem List   Diagnosis Date Noted  . Diabetes (HCC) 12/11/2017  . Seasonal allergies 06/05/2017  . Elevated PSA 06/05/2017  . PCP NOTES >>>>>>>>>>>>>>. 03/21/2016  . Hearing loss 03/16/2015  . Pain in joint, shoulder region 06/28/2013  . Annual physical exam 01/01/2011  . BPH (benign prostatic hyperplasia) 01/01/2011  . DIZZINESS 05/16/2009  . Dyslipidemia 06/15/2007  . Hepatitis B 06/15/2007  . GERD 03/12/2007  . 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 BACK PAIN 02/16/2007     Health Maintenance Due  Topic Date Due  . OPHTHALMOLOGY EXAM  Never done  . TETANUS/TDAP  03/14/2019    Soc hx: no drinking or smoking  Review of Systems 12 point ros is negative except what is mentioned above Physical Exam   BP 115/69   Pulse 71   Temp 98 F (36.7 C)   Wt 140 lb (63.5 kg)   BMI 26.45 kg/m   Physical Exam  Constitutional: He is oriented  to person, place, and time. He appears well-developed and well-nourished. No distress.  HENT:  Mouth/Throat: Oropharynx is clear and moist. No oropharyngeal exudate.  Cardiovascular: Normal rate, regular rhythm and normal heart sounds. Exam reveals no gallop and no friction rub.  No murmur heard.  Pulmonary/Chest: Effort normal and breath sounds normal. No respiratory distress. He has no wheezes.  Abdominal: Soft. Bowel sounds are normal. He exhibits no distension. There is no tenderness.  Lymphadenopathy:  He has no cervical adenopathy.  Neurological: He is alert and oriented to person, place, and time.  Skin: Skin is warm and dry. No rash noted. No erythema.  Psychiatric: He has a normal mood and affect. His behavior is normal.    Lab Results  Component Value Date   HEPBSAB NON-REACTIVE 11/11/2018   No results found for: RPR, LABRPR  CBC Lab Results  Component Value Date   WBC 7.3 06/14/2019   RBC 6.08 (H) 06/14/2019   HGB 16.0 06/14/2019   HCT 47.3 06/14/2019   PLT 148 06/14/2019   MCV 77.8 (L) 06/14/2019   MCH 26.3 (L) 06/14/2019   MCHC 33.8 06/14/2019   RDW 14.4 06/14/2019   LYMPHSABS 3,460 06/14/2019   MONOABS 665 08/27/2016   EOSABS 307 06/14/2019    BMET Lab Results  Component Value Date  NA 138 11/15/2019   K 5.2 11/15/2019   CL 101 11/15/2019   CO2 29 11/15/2019   GLUCOSE 170 (H) 11/15/2019   BUN 23 11/15/2019   CREATININE 1.33 (H) 11/15/2019   CALCIUM 9.7 11/15/2019   GFRNONAA 67 11/11/2018   GFRAA 78 11/11/2018      Assessment and Plan Chronic hepatitis b = will plan on him  Continue on vemlidy Get labs to see if has hep b s ab + conversion  Will get RUQ U/S for [facility] surveillance   Addendum = no masses noted on U/S

## 2020-01-19 LAB — COMPREHENSIVE METABOLIC PANEL
AG Ratio: 1.6 (calc) (ref 1.0–2.5)
ALT: 20 U/L (ref 9–46)
AST: 21 U/L (ref 10–35)
Albumin: 4.6 g/dL (ref 3.6–5.1)
Alkaline phosphatase (APISO): 48 U/L (ref 35–144)
BUN/Creatinine Ratio: 12 (calc) (ref 6–22)
BUN: 15 mg/dL (ref 7–25)
CO2: 29 mmol/L (ref 20–32)
Calcium: 9.6 mg/dL (ref 8.6–10.3)
Chloride: 102 mmol/L (ref 98–110)
Creat: 1.27 mg/dL — ABNORMAL HIGH (ref 0.70–1.11)
Globulin: 2.8 g/dL (calc) (ref 1.9–3.7)
Glucose, Bld: 152 mg/dL — ABNORMAL HIGH (ref 65–99)
Potassium: 5.3 mmol/L (ref 3.5–5.3)
Sodium: 139 mmol/L (ref 135–146)
Total Bilirubin: 0.5 mg/dL (ref 0.2–1.2)
Total Protein: 7.4 g/dL (ref 6.1–8.1)

## 2020-01-19 LAB — HEPATITIS B DNA, ULTRAQUANTITATIVE, PCR
Hepatitis B DNA (Calc): <1 Log IU/mL
Hepatitis B DNA: <10 IU/mL

## 2020-01-19 LAB — AFP TUMOR MARKER: AFP-Tumor Marker: 4 ng/mL (ref <6.1)

## 2020-01-24 ENCOUNTER — Ambulatory Visit (HOSPITAL_COMMUNITY)
Admission: RE | Admit: 2020-01-24 | Discharge: 2020-01-24 | Disposition: A | Discharge status: Home / Self Care | Payer: Medicare Other | Source: Ambulatory Visit | Attending: Internal Medicine | Admitting: Internal Medicine

## 2020-01-24 DIAGNOSIS — B181 Chronic viral hepatitis B without delta-agent: Secondary | ICD-10-CM | POA: Insufficient documentation

## 2020-02-03 MED FILL — VEMLIDY 25 MG TABLET: 25 | 30 days supply | Qty: 30 | Fill #5

## 2020-03-03 ENCOUNTER — Other Ambulatory Visit: Payer: Self-pay | Admitting: Internal Medicine

## 2020-03-03 DIAGNOSIS — B181 Chronic viral hepatitis B without delta-agent: Secondary | ICD-10-CM

## 2020-03-06 MED FILL — VEMLIDY 25 MG TABLET: 25 | 30 days supply | Qty: 30 | Fill #0

## 2020-03-23 ENCOUNTER — Ambulatory Visit (INDEPENDENT_AMBULATORY_CARE_PROVIDER_SITE_OTHER): Payer: Medicare Other | Admitting: Internal Medicine

## 2020-03-23 ENCOUNTER — Other Ambulatory Visit: Payer: Medicare Other

## 2020-03-23 ENCOUNTER — Other Ambulatory Visit: Payer: Self-pay

## 2020-03-23 ENCOUNTER — Encounter: Payer: Self-pay | Admitting: Internal Medicine

## 2020-03-23 VITALS — BP 112/70 | HR 80 | Temp 97.8°F | Resp 16 | Ht 61.0 in | Wt 142.1 lb

## 2020-03-23 DIAGNOSIS — R972 Elevated prostate specific antigen [PSA]: Secondary | ICD-10-CM

## 2020-03-23 DIAGNOSIS — E1165 Type 2 diabetes mellitus with hyperglycemia: Secondary | ICD-10-CM

## 2020-03-23 DIAGNOSIS — E119 Type 2 diabetes mellitus without complications: Secondary | ICD-10-CM

## 2020-03-23 DIAGNOSIS — E785 Hyperlipidemia, unspecified: Secondary | ICD-10-CM | POA: Diagnosis not present

## 2020-03-23 LAB — LIPID PANEL
Cholesterol: 228 mg/dL — ABNORMAL HIGH (ref 0–200)
HDL: 30.5 mg/dL — ABNORMAL LOW (ref >39.00)
Total CHOL/HDL Ratio: 7
Triglycerides: 495 mg/dL — ABNORMAL HIGH (ref 0.0–149.0)

## 2020-03-23 LAB — PSA: PSA: 5.89 ng/mL — ABNORMAL HIGH (ref 0.10–4.00)

## 2020-03-23 LAB — LDL CHOLESTEROL, DIRECT: Direct LDL: 108 mg/dL

## 2020-03-23 NOTE — Patient Instructions (Addendum)
  GO TO THE LAB : Get the blood work     GO TO THE FRONT DESK, PLEASE SCHEDULE YOUR APPOINTMENTS Come back for a checkup in 4 months 

## 2020-03-23 NOTE — Progress Notes (Signed)
Subjective:    Patient ID: Kevin Hutchinson, male    DOB: August 06, 1934, 86 y.o.   MRN: [id number]  DOS:  03/23/2020 Type of visit - description: Follow-up, here with his wife, we were assisted by a interpreter online.  Since the last office visit he is doing well has no major concerns. Reportedly, good medication compliance.   Review of Systems Denies any dysuria, gross hematuria or difficulty urinating  Past Medical History:  Diagnosis Date  . GERD (gastroesophageal reflux disease)    EGD 5-09: : Duodenitis without Hemorrhage, Esophageal Stricture.  . Hepatitis B   . Hyperlipidemia   . PPD positive    Was Rx INH x 3 months (2008)--HD      Past Surgical History:  Procedure Laterality Date  . NO PAST SURGERIES      Allergies as of 03/23/2020   No Known Allergies     Medication List       Accurate as of March 23, 2020 11:59 PM. If you have any questions, ask your nurse or doctor.        ezetimibe 10 MG tablet Commonly known as: Zetia Take 1 tablet (10 mg total) by mouth daily.   fenofibrate 54 MG tablet Take 1 tablet (54 mg total) by mouth daily.   metFORMIN 500 MG tablet Commonly known as: GLUCOPHAGE Take 1 tablet (500 mg total) by mouth 2 (two) times daily with a meal.   Vemlidy 25 MG Tabs Generic drug: Tenofovir Alafenamide Fumarate TAKE 1 TABLET (25 MG TOTAL) BY MOUTH DAILY.          Objective:   Physical Exam BP 112/70 (BP Location: Left Arm, Patient Position: Sitting, Cuff Size: Small)   Pulse 80   Temp 97.8 F (36.6 C) (Oral)   Resp 16   Ht 5\' 1"  (1.549 m)   Wt 142 lb 2 oz (64.5 kg)   SpO2 96%   BMI 26.85 kg/m  General:   Well developed, NAD, BMI noted. HEENT:  Normocephalic . Face symmetric, atraumatic Lungs:  CTA B Normal respiratory effort, no intercostal retractions, no accessory muscle use. Heart: RRR,  no murmur.  Lower extremities: no pretibial edema bilaterally  Skin: Not pale. Not jaundice Neurologic:  alert & oriented X3.   Speech normal, gait appropriate for age and unassisted Psych--  Cognition and judgment appear intact.  Cooperative with normal attention span and concentration.  Behavior appropriate. No anxious or depressed appearing.      Assessment     Assessment DM Anemia GERD: EGD 06-2007: duodenitis, esophageal stricture BPH, slightly increased PSA, declined to see urology 2015, 05/2017 Chronic hepatitis B H/o + PPD, INH 3 months 2008 [facility], s/p hearing assessment 11/30/2014: Severe hearing loss, has hearing aids   PLAN Chronic hepatitis B: Follow-up by ID, on Vemlidy DM: Last A1c increased from 6.7 to 8.2, I suspect due to poor compliance with metformin, creatinine was slightly elevated at the time, I recommended metformin 500 mg BID, check a A1c Dyslipidemia: On fenofibrate, last LDL elevated, Zetia added, I am reluctant to Rx statins due to his chronic hepatitis B, check a FLP Elevated PSA: No symptoms, last DRE 04-2019 normal, recheck PSA, if significantly elevated I would rec  urology eval, patient aware. RTC 4 months   This visit occurred during the SARS-CoV-2 public health emergency.  Safety protocols were in place, including screening questions prior to the visit, additional usage of staff PPE, and extensive cleaning of exam room while observing appropriate contact time  as indicated for disinfecting solutions.

## 2020-03-23 NOTE — Progress Notes (Unsigned)
Pre visit review using our clinic review tool, if applicable. No additional management support is needed unless otherwise documented below in the visit note. 

## 2020-03-24 LAB — HEMOGLOBIN A1C
Hgb A1c MFr Bld: 8.5 % of total Hgb — ABNORMAL HIGH (ref <5.7)
Mean Plasma Glucose: 197 mg/dL
eAG (mmol/L): 10.9 mmol/L

## 2020-03-24 NOTE — Assessment & Plan Note (Signed)
Chronic hepatitis B: Follow-up by ID, on Vemlidy DM: Last A1c increased from 6.7 to 8.2, I suspect due to poor compliance with metformin, creatinine was slightly elevated at the time, I recommended metformin 500 mg BID, check a A1c Dyslipidemia: On fenofibrate, last LDL elevated, Zetia added, I am reluctant to Rx statins due to his chronic hepatitis B, check a FLP Elevated PSA: No symptoms, last DRE 04-2019 normal, recheck PSA, if significantly elevated I would rec  urology eval, patient aware. RTC 4 months

## 2020-03-28 NOTE — Addendum Note (Signed)
Addended byConrad Truckee D on: 03/28/2020 02:52 PM   Modules accepted: Orders

## 2020-04-06 DIAGNOSIS — R3912 Poor urinary stream: Secondary | ICD-10-CM | POA: Diagnosis not present

## 2020-04-06 DIAGNOSIS — R3915 Urgency of urination: Secondary | ICD-10-CM | POA: Diagnosis not present

## 2020-04-14 MED FILL — VEMLIDY 25 MG TABLET: 25 | 30 days supply | Qty: 30 | Fill #1

## 2020-05-11 DIAGNOSIS — R3912 Poor urinary stream: Secondary | ICD-10-CM | POA: Diagnosis not present

## 2020-05-19 MED FILL — VEMLIDY 25 MG TABLET: 25 | 30 days supply | Qty: 30 | Fill #2

## 2020-05-25 ENCOUNTER — Other Ambulatory Visit (HOSPITAL_COMMUNITY): Payer: Self-pay

## 2020-05-29 ENCOUNTER — Ambulatory Visit (INDEPENDENT_AMBULATORY_CARE_PROVIDER_SITE_OTHER): Payer: Medicare Other | Admitting: Internal Medicine

## 2020-05-29 ENCOUNTER — Encounter: Payer: Self-pay | Admitting: Internal Medicine

## 2020-05-29 ENCOUNTER — Other Ambulatory Visit: Payer: Self-pay

## 2020-05-29 VITALS — BP 116/70 | HR 92 | Ht 61.0 in | Wt 140.0 lb

## 2020-05-29 DIAGNOSIS — E1169 Type 2 diabetes mellitus with other specified complication: Secondary | ICD-10-CM

## 2020-05-29 DIAGNOSIS — E119 Type 2 diabetes mellitus without complications: Secondary | ICD-10-CM | POA: Diagnosis not present

## 2020-05-29 DIAGNOSIS — E785 Hyperlipidemia, unspecified: Secondary | ICD-10-CM

## 2020-05-29 DIAGNOSIS — H40033 Anatomical narrow angle, bilateral: Secondary | ICD-10-CM | POA: Diagnosis not present

## 2020-05-29 LAB — POCT GLUCOSE (DEVICE FOR HOME USE): POC Glucose: 201 mg/dl — AB (ref 70–99)

## 2020-05-29 LAB — POCT GLYCOSYLATED HEMOGLOBIN (HGB A1C): Hemoglobin A1C: 8.5 % — AB (ref 4.0–5.6)

## 2020-05-29 LAB — HM DIABETES EYE EXAM

## 2020-05-29 MED ORDER — METFORMIN HCL 500 MG PO TABS
500.0000 mg | ORAL_TABLET | Freq: Two times a day (BID) | ORAL | 1 refills | Status: DC
Start: 2020-05-29 — End: 2020-09-25

## 2020-05-29 NOTE — Progress Notes (Signed)
Name: Kevin Hutchinson  MRN/ DOB: [id number], 25-Jan-1935   Age/ Sex: 86 y.o., male    PCP: Wanda Plump, MD   Reason for Endocrinology Evaluation: Type 2 Diabetes Mellitus     Date of Initial Endocrinology Visit: 05/29/2020     PATIENT IDENTIFIER: Kevin Hutchinson is a 86 y.o. male with a past medical history of T2DM and Hepatitis B. The patient presented for initial endocrinology clinic visit on 05/29/2020 for consultative assistance with his diabetes management.    HPI: Kevin Hutchinson is accompanied by his wife who is providing most of the history   Interprter line use [facility] [employer]   Diagnosed with DM in 2019 Prior Medications tried/Intolerance: Unknown  Currently checking blood sugars 0x / day,  Due to lack of glucose meter  Hypoglycemia episodes : unknown         Hemoglobin A1c has ranged from 6.7%in 2021, peaking at 8.5% in 2022. Patient required assistance for hypoglycemia: no  Patient has required hospitalization within the last 1 year from hyper or hypoglycemia: no   In terms of diet, the patient eats three meals a day and eats sweets between the meals. Rarely drinks sugar-sweetened beverages    HOME DIABETES REGIMEN: Metformin 500 mg BID - daily    Statin: no ACE-I/ARB: no   METER DOWNLOAD SUMMARY: Does not check    DIABETIC COMPLICATIONS: Microvascular complications:    Denies: CKD  Last eye exam: Completed ?  Macrovascular complications:    Denies: CAD, PVD, CVA   PAST HISTORY: Past Medical History:  Past Medical History:  Diagnosis Date  . GERD (gastroesophageal reflux disease)    EGD 5-09: : Duodenitis without Hemorrhage, Esophageal Stricture.  . Hepatitis B   . Hyperlipidemia   . PPD positive    Was Rx INH x 3 months (2008)--HD     Past Surgical History:  Past Surgical History:  Procedure Laterality Date  . NO PAST SURGERIES        Social History:  reports that he has quit smoking. He has never used smokeless tobacco. He reports current alcohol use. He  reports that he does not use drugs.  Family History: History reviewed. No pertinent family history.   HOME MEDICATIONS: Allergies as of 05/29/2020   No Known Allergies     Medication List       Accurate as of May 29, 2020 10:51 AM. If you have any questions, ask your nurse or doctor.        ezetimibe 10 MG tablet Commonly known as: Zetia Take 1 tablet (10 mg total) by mouth daily.   fenofibrate 54 MG tablet Take 1 tablet (54 mg total) by mouth daily.   metFORMIN 500 MG tablet Commonly known as: GLUCOPHAGE Take 1 tablet (500 mg total) by mouth 2 (two) times daily with a meal. What changed: when to take this   Vemlidy 25 MG Tabs Generic drug: Tenofovir Alafenamide Fumarate TAKE 1 TABLET (25 MG TOTAL) BY MOUTH DAILY.        ALLERGIES: No Known Allergies   REVIEW OF SYSTEMS: A comprehensive ROS was conducted with the patient and is negative except as per HPI   OBJECTIVE:   VITAL SIGNS: BP 116/70   Pulse 92   Ht 5\' 1"  (1.549 m)   Wt 140 lb (63.5 kg)   SpO2 98%   BMI 26.45 kg/m    PHYSICAL EXAM:  General: Pt appears well and is in NAD  Lungs: Clear with good BS bilat  with no rales, rhonchi, or wheezes  Heart: RRR   Abdomen: Normoactive bowel sounds, soft, nontender, without masses or organomegaly palpable  Extremities:  Lower extremities - No pretibial edema. No lesions.  Skin:  No rash noted.   Neuro: MS is good with appropriate affect, pt is alert and Ox3    DATA REVIEWED:  Lab Results  Component Value Date   HGBA1C 8.5 (A) 05/29/2020   HGBA1C 8.5 (H) 03/23/2020   HGBA1C 8.2 (H) 11/15/2019   Lab Results  Component Value Date   LDLCALC 142 (H) 08/09/2019   CREATININE 1.27 (H) 01/17/2020   No results found for: [facility]  Lab Results  Component Value Date   CHOL 228 (H) 03/23/2020   HDL 30.50 (L) 03/23/2020   LDLCALC 142 (H) 08/09/2019   LDLDIRECT 108.0 03/23/2020   TRIG (H) 03/23/2020    495.0 Triglyceride is over 400; calculations  on Lipids are invalid.   CHOLHDL 7 03/23/2020        ASSESSMENT / PLAN / RECOMMENDATIONS:   1) Type 2 Diabetes Mellitus, Poorly controlled, With CKD III complications - Most recent A1c of 8.5 %. Goal A1c < 7.0%.   - This was a limited visit with limited information as the WIFI to the interpreter line was disconnected during the visit and despite attempting multiple times to reconnect , we were unsuccessful.  - We were unable to train them on the use of glucose meter  - It seems hyperglycemia is due to dietary indiscretions - Will increase Metformin as below  - Calculated GFR 51.9  MEDICATIONS:  Increase Metformin 500 mg to 1 tablet with Breakfast and 1 tablet with Supper     2) Dyslipidemia: Patient is on zetia and fenofibrate   - Tg elevated at 495 mg/dL this increases the risk of pancreatitis , hence will avoid DPP-4 inhibitors and GLp-1 agonists not to increase this chance - I am hoping this will continue to improve with lowering glucose , if not will make adjustments as needed   F/U in 3 months    Signed electronically by: Lyndle Herrlich, MD  [facility] Endocrinology  [facility] [address] [place] [place], [place] [address] Phone: [phone] FAX: [phone]   CC: Wanda Plump, MD [address] [facility] [address] [place] [address] Phone: [phone]  Fax: [phone]    Return to Endocrinology clinic as below: Future Appointments  Date Time Provider Department Center  07/17/2020  9:15 AM Judyann Munson, MD [facility]  07/25/2020  8:40 AM Wanda Plump, MD [facility]  09/25/2020  8:50 AM Hasan Douse, Konrad Dolores, MD [facility] None

## 2020-05-29 NOTE — Patient Instructions (Addendum)
-   Increase Metformin 500 mg to 1 tablet with Breakfast and 1 tablet with Supper      - T?ng Metformin 500 mg ln 1 vin vo B?a sng v 1 vin v?i B?a t?i

## 2020-06-01 ENCOUNTER — Other Ambulatory Visit (HOSPITAL_COMMUNITY): Payer: Self-pay

## 2020-06-14 ENCOUNTER — Other Ambulatory Visit (HOSPITAL_COMMUNITY): Payer: Self-pay

## 2020-06-14 MED FILL — Tenofovir Alafenamide Fumarate Tab 25 MG: ORAL | 30 days supply | Qty: 30 | Fill #0 | Status: AC

## 2020-06-19 ENCOUNTER — Other Ambulatory Visit (HOSPITAL_COMMUNITY): Payer: Self-pay

## 2020-07-12 ENCOUNTER — Other Ambulatory Visit (HOSPITAL_COMMUNITY): Payer: Self-pay

## 2020-07-14 ENCOUNTER — Other Ambulatory Visit (HOSPITAL_COMMUNITY): Payer: Self-pay

## 2020-07-17 ENCOUNTER — Ambulatory Visit (INDEPENDENT_AMBULATORY_CARE_PROVIDER_SITE_OTHER): Payer: Medicare Other | Admitting: Internal Medicine

## 2020-07-17 ENCOUNTER — Other Ambulatory Visit: Payer: Self-pay

## 2020-07-17 ENCOUNTER — Other Ambulatory Visit (HOSPITAL_COMMUNITY): Payer: Self-pay

## 2020-07-17 ENCOUNTER — Ambulatory Visit (INDEPENDENT_AMBULATORY_CARE_PROVIDER_SITE_OTHER): Payer: Medicare Other | Admitting: *Deleted

## 2020-07-17 ENCOUNTER — Other Ambulatory Visit: Payer: Self-pay | Admitting: Internal Medicine

## 2020-07-17 ENCOUNTER — Encounter: Payer: Self-pay | Admitting: Internal Medicine

## 2020-07-17 VITALS — BP 155/84 | HR 73 | Temp 97.7°F | Wt 138.0 lb

## 2020-07-17 DIAGNOSIS — H833X9 Noise effects on inner ear, unspecified ear: Secondary | ICD-10-CM

## 2020-07-17 DIAGNOSIS — B181 Chronic viral hepatitis B without delta-agent: Secondary | ICD-10-CM | POA: Diagnosis not present

## 2020-07-17 DIAGNOSIS — Z79899 Other long term (current) drug therapy: Secondary | ICD-10-CM

## 2020-07-17 DIAGNOSIS — Z23 Encounter for immunization: Secondary | ICD-10-CM

## 2020-07-17 MED ORDER — TENOFOVIR ALAFENAMIDE FUMARATE 25 MG PO TABS
1.0000 | ORAL_TABLET | Freq: Every day | ORAL | 11 refills | Status: DC
  Filled 2020-07-17 – 2020-07-18 (×2): qty 30, 30d supply, fill #0
  Filled 2020-08-15 – 2020-09-06 (×2): qty 30, 30d supply, fill #1

## 2020-07-17 NOTE — Progress Notes (Signed)
Patient ID: Kevin Hutchinson, male   DOB: [birth year], 86 y.o.   MRN: [id number]  HPI 86yo M with chronic hep b and hard of hearing. He reports to be in good health. Continues to take her medications without difficulty. Has not had any GI issues or abdominal pain.  Outpatient Encounter Medications as of 07/17/2020  Medication Sig  . ezetimibe (ZETIA) 10 MG tablet Take 1 tablet (10 mg total) by mouth daily.  . fenofibrate 54 MG tablet Take 1 tablet (54 mg total) by mouth daily.  . metFORMIN (GLUCOPHAGE) 500 MG tablet Take 1 tablet (500 mg total) by mouth 2 (two) times daily with a meal.  . Tenofovir Alafenamide Fumarate 25 MG TABS TAKE 1 TABLET (25 MG TOTAL) BY MOUTH DAILY.  . tamsulosin (FLOMAX) 0.4 MG CAPS capsule Take 0.4 mg by mouth daily.   No facility-administered encounter medications on file as of 07/17/2020.     Patient Active Problem List   Diagnosis Date Noted  . Diabetes (HCC) 12/11/2017  . Seasonal allergies 06/05/2017  . Elevated PSA 06/05/2017  . PCP NOTES >>>>>>>>>>>>>>. 03/21/2016  . Hearing loss 03/16/2015  . Pain in joint, shoulder region 06/28/2013  . Annual physical exam 01/01/2011  . BPH (benign prostatic hyperplasia) 01/01/2011  . DIZZINESS 05/16/2009  . Dyslipidemia 06/15/2007  . Hepatitis B 06/15/2007  . GERD 03/12/2007  . 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 BACK PAIN 02/16/2007     Health Maintenance Due  Topic Date Due  . OPHTHALMOLOGY EXAM  Never done  . TETANUS/TDAP  03/14/2019    Social History   Tobacco Use  . Smoking status: Former Games developer  . Smokeless tobacco: Never Used  . Tobacco comment: quit in the 90s  Substance Use Topics  . Alcohol use: Yes    Comment: RARE  . Drug use: No   Review of Systems Review of Systems  Constitutional: Negative for fever, chills, diaphoresis, activity change, appetite change, fatigue and unexpected weight change.  HENT: Negative for congestion, sore throat, rhinorrhea, sneezing, trouble swallowing and sinus  pressure.  Eyes: Negative for photophobia and visual disturbance.  Respiratory: Negative for cough, chest tightness, shortness of breath, wheezing and stridor.  Cardiovascular: Negative for chest pain, palpitations and leg swelling.  Gastrointestinal: Negative for nausea, vomiting, abdominal pain, diarrhea, constipation, blood in stool, abdominal distention and anal bleeding.  Genitourinary: Negative for dysuria, hematuria, flank pain and difficulty urinating.  Musculoskeletal: Negative for myalgias, back pain, joint swelling, arthralgias and gait problem.  Skin: Negative for color change, pallor, rash and wound.  Neurological: Negative for dizziness, tremors, weakness and light-headedness.  Hematological: Negative for adenopathy. Does not bruise/bleed easily.  Psychiatric/Behavioral: Negative for behavioral problems, confusion, sleep disturbance, dysphoric mood, decreased concentration and agitation.    Physical Exam   BP (!) 155/84   Pulse 73   Temp 97.7 F (36.5 C) (Oral)   Wt 138 lb (62.6 kg)   SpO2 100%   BMI 26.07 kg/m   Physical Exam  Constitutional: He is oriented to person, place, and time. He appears well-developed and well-nourished. No distress.  HENT:  Mouth/Throat: Oropharynx is clear and moist. No oropharyngeal exudate.  Cardiovascular: Normal rate, regular rhythm and normal heart sounds. Exam reveals no gallop and no friction rub.  No murmur heard.  Pulmonary/Chest: Effort normal and breath sounds normal. No respiratory distress. He has no wheezes.  Abdominal: Soft. Bowel sounds are normal. He exhibits no distension. There is no tenderness.  Lymphadenopathy:  He has no  cervical adenopathy.  Neurological: He is alert and oriented to person, place, and time.  Skin: Skin is warm and dry. No rash noted. No erythema.  Psychiatric: He has a normal mood and affect. His behavior is normal.    Lab Results  Component Value Date   HEPBSAB NON-REACTIVE 11/11/2018   No  results found for: RPR, LABRPR  CBC Lab Results  Component Value Date   WBC 7.3 06/14/2019   RBC 6.08 (H) 06/14/2019   HGB 16.0 06/14/2019   HCT 47.3 06/14/2019   PLT 148 06/14/2019   MCV 77.8 (L) 06/14/2019   MCH 26.3 (L) 06/14/2019   MCHC 33.8 06/14/2019   RDW 14.4 06/14/2019   LYMPHSABS 3,460 06/14/2019   MONOABS 665 08/27/2016   EOSABS 307 06/14/2019    BMET Lab Results  Component Value Date   NA 139 01/17/2020   K 5.3 01/17/2020   CL 102 01/17/2020   CO2 29 01/17/2020   GLUCOSE 152 (H) 01/17/2020   BUN 15 01/17/2020   CREATININE 1.27 (H) 01/17/2020   CALCIUM 9.6 01/17/2020   GFRNONAA 67 11/11/2018   GFRAA 78 11/11/2018      Assessment and Plan Chronic hepatitis B = will continue on TAF plus will check Blood work and plan to do 6 months RUQ U/S surveillance  Long term medication = cr is stable  Hard of hearing = asked him to reconsider evaluation of hearing aids.  Health maintenance = will recommend for her to get covid booster #2 ( total of 4)

## 2020-07-17 NOTE — Progress Notes (Signed)
   Covid-19 Vaccination Clinic  Name:  MAURO ARPS    MRN: [id number] DOB: [birth year]  07/17/2020  Mr. Colaizzi was observed post Covid-19 immunization for 15 minutes without incident. He was provided with Vaccine Information Sheet and instruction to access the V-Safe system.   Mr. Habeeb was instructed to call 911 with any severe reactions post vaccine: Marland Kitchen Difficulty breathing  . Swelling of face and throat  . A fast heartbeat  . A bad rash all over body  . Dizziness and weakness     Andree Coss, RN

## 2020-07-18 ENCOUNTER — Other Ambulatory Visit (HOSPITAL_COMMUNITY): Payer: Self-pay

## 2020-07-20 LAB — COMPREHENSIVE METABOLIC PANEL
AG Ratio: 1.5 (calc) (ref 1.0–2.5)
ALT: 25 U/L (ref 9–46)
AST: 21 U/L (ref 10–35)
Albumin: 4.5 g/dL (ref 3.6–5.1)
Alkaline phosphatase (APISO): 48 U/L (ref 35–144)
BUN/Creatinine Ratio: 18 (calc) (ref 6–22)
BUN: 20 mg/dL (ref 7–25)
CO2: 30 mmol/L (ref 20–32)
Calcium: 9.4 mg/dL (ref 8.6–10.3)
Chloride: 101 mmol/L (ref 98–110)
Creat: 1.13 mg/dL — ABNORMAL HIGH (ref 0.70–1.11)
Globulin: 3 g/dL (calc) (ref 1.9–3.7)
Glucose, Bld: 153 mg/dL — ABNORMAL HIGH (ref 65–99)
Potassium: 5.3 mmol/L (ref 3.5–5.3)
Sodium: 136 mmol/L (ref 135–146)
Total Bilirubin: 0.5 mg/dL (ref 0.2–1.2)
Total Protein: 7.5 g/dL (ref 6.1–8.1)

## 2020-07-20 LAB — CBC
HCT: 45.1 % (ref 38.5–50.0)
Hemoglobin: 14.6 g/dL (ref 13.2–17.1)
MCH: 25.1 pg — ABNORMAL LOW (ref 27.0–33.0)
MCHC: 32.4 g/dL (ref 32.0–36.0)
MCV: 77.6 fL — ABNORMAL LOW (ref 80.0–100.0)
MPV: 11.4 fL (ref 7.5–12.5)
Platelets: 135 10*3/uL — ABNORMAL LOW (ref 140–400)
RBC: 5.81 10*6/uL — ABNORMAL HIGH (ref 4.20–5.80)
RDW: 14.2 % (ref 11.0–15.0)
WBC: 7.9 10*3/uL (ref 3.8–10.8)

## 2020-07-20 LAB — HEPATITIS B SURFACE ANTIBODY,QUALITATIVE: Hep B S Ab: NONREACTIVE

## 2020-07-20 LAB — HEPATITIS B DNA, ULTRAQUANTITATIVE, PCR
Hepatitis B DNA (Calc): <1 Log IU/mL
Hepatitis B DNA: <10 IU/mL

## 2020-07-20 LAB — HEPATITIS B E ANTIBODY: Hep B E Ab: REACTIVE — AB

## 2020-07-25 ENCOUNTER — Ambulatory Visit (INDEPENDENT_AMBULATORY_CARE_PROVIDER_SITE_OTHER): Payer: Medicare Other | Admitting: Internal Medicine

## 2020-07-25 ENCOUNTER — Other Ambulatory Visit: Payer: Self-pay

## 2020-07-25 ENCOUNTER — Encounter: Payer: Self-pay | Admitting: Internal Medicine

## 2020-07-25 VITALS — BP 122/84 | HR 81 | Temp 98.4°F | Resp 18 | Ht 61.0 in | Wt 138.4 lb

## 2020-07-25 DIAGNOSIS — B181 Chronic viral hepatitis B without delta-agent: Secondary | ICD-10-CM | POA: Diagnosis not present

## 2020-07-25 DIAGNOSIS — J069 Acute upper respiratory infection, unspecified: Secondary | ICD-10-CM | POA: Diagnosis not present

## 2020-07-25 DIAGNOSIS — R972 Elevated prostate specific antigen [PSA]: Secondary | ICD-10-CM

## 2020-07-25 NOTE — Patient Instructions (Addendum)
Continue the same medicines  Continue Mucinex DM, drink plenty of fluids  If the cough is not gradually better let us know  See the ENDOCRINOLOGIST for diabetes    GO TO THE FRONT DESK, PLEASE SCHEDULE YOUR APPOINTMENTS Come back for  A physical exam in 4-5 months

## 2020-07-25 NOTE — Progress Notes (Signed)
Subjective:    Patient ID: Kevin Hutchinson, male    DOB: 04/09/1934, 86 y.o.   MRN: [id number]  DOS:  07/25/2020 Type of visit - description: ROV Here with his wife.  Doing well, did  he develop a cold a week ago: Mild cough, no sputum production, taking Mucinex.  He denies chest pain, difficulty breathing. No fever chills No myalgias No nausea or vomiting.   Review of Systems See above   Past Medical History:  Diagnosis Date  . GERD (gastroesophageal reflux disease)    EGD 5-09: : Duodenitis without Hemorrhage, Esophageal Stricture.  . Hepatitis B   . Hyperlipidemia   . PPD positive    Was Rx INH x 3 months (2008)--HD      Past Surgical History:  Procedure Laterality Date  . NO PAST SURGERIES      Allergies as of 07/25/2020   No Known Allergies     Medication List       Accurate as of Jul 25, 2020  8:48 PM. If you have any questions, ask your nurse or doctor.        ezetimibe 10 MG tablet Commonly known as: Zetia Take 1 tablet (10 mg total) by mouth daily.   fenofibrate 54 MG tablet Take 1 tablet (54 mg total) by mouth daily.   metFORMIN 500 MG tablet Commonly known as: GLUCOPHAGE Take 1 tablet (500 mg total) by mouth 2 (two) times daily with a meal.   tamsulosin 0.4 MG Caps capsule Commonly known as: FLOMAX Take 0.4 mg by mouth daily.   Vemlidy 25 MG Tabs Generic drug: Tenofovir Alafenamide Fumarate Ngy u?ng 1 l?n, m?i l?n 1 vin (t?ng c?ng 25 mg). (TAKE 1 TABLET (25 MG TOTAL) BY MOUTH DAILY.)          Objective:   Physical Exam BP 122/84 (BP Location: Left Arm, Patient Position: Sitting, Cuff Size: Small)   Pulse 81   Temp 98.4 F (36.9 C) (Oral)   Resp 18   Ht 5\' 1"  (1.549 m)   Wt 138 lb 6 oz (62.8 kg)   SpO2 98%   BMI 26.15 kg/m    Visit was assisted by video interpreter who did a great job.  General:   Well developed, NAD, BMI noted. HEENT:  Normocephalic . Face symmetric, atraumatic.  Nose is slightly congested Lungs:  CTA  B.  No crackles or rhonchi Normal respiratory effort, no intercostal retractions, no accessory muscle use. Heart: RRR,  no murmur.  Lower extremities: no pretibial edema bilaterally  Skin: Not pale. Not jaundice Neurologic:  alert & oriented X3.  Speech normal, gait appropriate for age and unassisted Psych--  Cognition and judgment appear intact.  Cooperative with normal attention span and concentration.  Behavior appropriate. No anxious or depressed appearing.      Assessment      Assessment DM Anemia GERD: EGD 06-2007: duodenitis, esophageal stricture BPH, slightly increased PSA, declined to see urology 2015, 05/2017 Chronic hepatitis B H/o + PPD, INH 3 months 2008 [facility], s/p hearing assessment 11/30/2014: Severe hearing loss, has hearing aids   PLAN DM: Per Endo, encouraged to see the endocrinologist as recommended Chronic hepatitis B: Follow-up by ID, last visit few days ago. URI: Symptoms a started a week ago, he had 4 COVID vaccines, he is out of the window for treatment specifically for COVID thus rec Mucinex, rest, fluids, call if not improving gradually Increased PSA: Denies any symptoms, declined at PSA drawn today. RTC 4 months  CPX.   This visit occurred during the SARS-CoV-2 public health emergency.  Safety protocols were in place, including screening questions prior to the visit, additional usage of staff PPE, and extensive cleaning of exam room while observing appropriate contact time as indicated for disinfecting solutions.

## 2020-07-25 NOTE — Assessment & Plan Note (Signed)
DM: Per Endo, encouraged to see the endocrinologist as recommended Chronic hepatitis B: Follow-up by ID, last visit few days ago. URI: Symptoms a started a week ago, he had 4 COVID vaccines, he is out of the window for treatment specifically for COVID thus rec Mucinex, rest, fluids, call if not improving gradually Increased PSA: Denies any symptoms, declined at PSA drawn today. RTC 4 months CPX.

## 2020-07-26 ENCOUNTER — Ambulatory Visit (HOSPITAL_COMMUNITY): Admission: RE | Admit: 2020-07-26 | Payer: Medicare Other | Source: Ambulatory Visit

## 2020-08-11 ENCOUNTER — Other Ambulatory Visit: Payer: Self-pay

## 2020-08-11 ENCOUNTER — Ambulatory Visit (HOSPITAL_COMMUNITY)
Admission: RE | Admit: 2020-08-11 | Discharge: 2020-08-11 | Disposition: A | Discharge status: Home / Self Care | Payer: Medicare Other | Source: Ambulatory Visit | Attending: Internal Medicine | Admitting: Internal Medicine

## 2020-08-11 DIAGNOSIS — B181 Chronic viral hepatitis B without delta-agent: Secondary | ICD-10-CM | POA: Diagnosis present

## 2020-08-11 DIAGNOSIS — K760 Fatty (change of) liver, not elsewhere classified: Secondary | ICD-10-CM | POA: Diagnosis not present

## 2020-08-15 ENCOUNTER — Other Ambulatory Visit (HOSPITAL_COMMUNITY): Payer: Self-pay

## 2020-08-16 ENCOUNTER — Other Ambulatory Visit (HOSPITAL_COMMUNITY): Payer: Self-pay

## 2020-08-17 ENCOUNTER — Other Ambulatory Visit (HOSPITAL_COMMUNITY): Payer: Self-pay

## 2020-08-18 ENCOUNTER — Other Ambulatory Visit (HOSPITAL_COMMUNITY): Payer: Self-pay

## 2020-08-23 ENCOUNTER — Other Ambulatory Visit (HOSPITAL_COMMUNITY): Payer: Self-pay

## 2020-08-25 ENCOUNTER — Other Ambulatory Visit (HOSPITAL_COMMUNITY): Payer: Self-pay

## 2020-08-29 ENCOUNTER — Other Ambulatory Visit (HOSPITAL_COMMUNITY): Payer: Self-pay

## 2020-09-05 ENCOUNTER — Other Ambulatory Visit (HOSPITAL_COMMUNITY): Payer: Self-pay

## 2020-09-06 ENCOUNTER — Telehealth: Payer: Self-pay

## 2020-09-06 ENCOUNTER — Other Ambulatory Visit (HOSPITAL_COMMUNITY): Payer: Self-pay

## 2020-09-06 NOTE — Telephone Encounter (Addendum)
[facility] Patient Advocate Encounter  Received notification from [employer] that patient has been denied enrollment into their program to receive Vemlidy from the drug manufacturer.    There are no HEP-B  funds at the present time.    Patient knows to call the office with questions or concerns.   [facility] Clinic will continue to follow.  Clearance Coots, CPhT Specialty Pharmacy Patient [facility] Phone: [phone] Fax:  [phone]

## 2020-09-12 ENCOUNTER — Telehealth: Payer: Self-pay | Admitting: Pharmacist

## 2020-09-12 ENCOUNTER — Other Ambulatory Visit (HOSPITAL_COMMUNITY): Payer: Self-pay

## 2020-09-12 DIAGNOSIS — B181 Chronic viral hepatitis B without delta-agent: Secondary | ICD-10-CM

## 2020-09-12 MED ORDER — TENOFOVIR DISOPROXIL FUMARATE 300 MG PO TABS
300.0000 mg | ORAL_TABLET | ORAL | 2 refills | Status: DC
  Filled 2020-09-12 – 2020-09-13 (×2): qty 15, 30d supply, fill #0
  Filled 2020-10-11: qty 15, 30d supply, fill #1
  Filled 2020-11-29: qty 15, 30d supply, fill #2

## 2020-09-12 NOTE — Telephone Encounter (Signed)
Patient is currently taking Vemlidy for his Hepatitis B infection. His Medicare insurance is not covering all of the cost leaving him with a copay of over $200. There are no Hepatitis B funds open at this time. Viread will be much cheaper as it is generic, only costing him ~$15. Will switch him to Viread and let his family know.   His SCr is 1.13 and CrCl is 43 ml/min. Due to his kidney function and age, he will need q48h dosing of Viread. He may be close to meeting his deductible and Wynonia Sours may become more affordable in the coming months. Will switch him back once he can afford it to decrease need for kidney adjustment and monitoring.

## 2020-09-13 ENCOUNTER — Other Ambulatory Visit (HOSPITAL_COMMUNITY): Payer: Self-pay

## 2020-09-25 ENCOUNTER — Encounter: Payer: Self-pay | Admitting: Internal Medicine

## 2020-09-25 ENCOUNTER — Other Ambulatory Visit: Payer: Self-pay

## 2020-09-25 ENCOUNTER — Ambulatory Visit (INDEPENDENT_AMBULATORY_CARE_PROVIDER_SITE_OTHER): Payer: Medicare Other | Admitting: Internal Medicine

## 2020-09-25 VITALS — BP 110/72 | HR 82 | Ht 61.0 in | Wt 137.6 lb

## 2020-09-25 DIAGNOSIS — E785 Hyperlipidemia, unspecified: Secondary | ICD-10-CM | POA: Diagnosis not present

## 2020-09-25 DIAGNOSIS — E1169 Type 2 diabetes mellitus with other specified complication: Secondary | ICD-10-CM

## 2020-09-25 LAB — POCT GLYCOSYLATED HEMOGLOBIN (HGB A1C): Hemoglobin A1C: 6.9 % — AB (ref 4.0–5.6)

## 2020-09-25 MED ORDER — EZETIMIBE 10 MG PO TABS: 10.0000 mg | ORAL_TABLET | Freq: Every day | ORAL | 3 refills | Status: DC

## 2020-09-25 MED ORDER — FENOFIBRATE 160 MG PO TABS: 160.0000 mg | ORAL_TABLET | Freq: Every day | ORAL | 3 refills | Status: DC

## 2020-09-25 MED ORDER — METFORMIN HCL 500 MG PO TABS: 500.0000 mg | ORAL_TABLET | Freq: Two times a day (BID) | ORAL | 3 refills | Status: DC

## 2020-09-25 NOTE — Progress Notes (Signed)
Name: Kevin Hutchinson  Age/ Sex: 86 y.o., male   MRN/ DOB: [id number], 02/20/1935     PCP: Wanda Plump, Kevin Hutchinson   Reason for Endocrinology Evaluation: Type 2 Diabetes Mellitus  Initial Endocrine Consultative Visit: 05/29/2020    PATIENT IDENTIFIER: [name] is a 86 y.o. male with a past medical history of T2DM and hepatitis B. The patient has followed with Endocrinology clinic since 05/29/2020 for consultative assistance with management of his diabetes.  DIABETIC HISTORY:  Kevin Hutchinson was diagnosed with DM in 2019. His hemoglobin A1c has ranged from  6.7%in 2021, peaking at 8.5% in 2022..  On his initial visit to our clinic he had an A1c of  8.5% , he was on Metformin only, which we increased    SUBJECTIVE:   During the last visit (05/29/2020): A1c 8.5%, we increased Metformin   Today (09/25/2020): Kevin Hutchinson  is here for a follow up on diabetes management.He is accompanied by his wife today. Interpreter line was used today.  He checks his blood sugars 0 times daily  Denies nausea or diarrhea   HOME DIABETES REGIMEN:  Metformin 500 mg 1 tablet with Breakfast and 1 tablet with Supper  Statin: no ACE-I/ARB: no   METER DOWNLOAD SUMMARY: Did not bring     DIABETIC COMPLICATIONS: Microvascular complications:   Denies: CKD Last Eye Exam: Completed 06/2020  Macrovascular complications:   Denies: CAD, CVA, PVD   HISTORY:  Past Medical History:  Past Medical History:  Diagnosis Date   GERD (gastroesophageal reflux disease)    EGD 5-09: : Duodenitis without Hemorrhage, Esophageal Stricture.   Hepatitis B    Hyperlipidemia    PPD positive    Was Rx INH x 3 months (2008)--HD     Past Surgical History:  Past Surgical History:  Procedure Laterality Date   NO PAST SURGERIES     Social History:  reports that he has quit smoking. He has never used smokeless tobacco. He reports current alcohol use. He reports that he does not use drugs. Family History: No family history on  file.   HOME MEDICATIONS: Allergies as of 09/25/2020   No Known Allergies      Medication List        Accurate as of September 25, 2020  9:05 AM. If you have any questions, ask your nurse or doctor.          ezetimibe 10 MG tablet Commonly known as: Zetia Take 1 tablet (10 mg total) by mouth daily.   fenofibrate 160 MG tablet Take 1 tablet (160 mg total) by mouth daily. What changed:  medication strength how much to take Changed by: Kevin Shorts, Kevin Hutchinson   metFORMIN 500 MG tablet Commonly known as: GLUCOPHAGE Take 1 tablet (500 mg total) by mouth 2 (two) times daily with a meal.   tamsulosin 0.4 MG Caps capsule Commonly known as: FLOMAX Take 0.4 mg by mouth daily.   tenofovir 300 MG tablet Commonly known as: VIREAD Take 1 tablet (300 mg total) by mouth every other day.         OBJECTIVE:   Vital Signs: BP 110/72 (BP Location: Left Arm, Patient Position: Sitting, Cuff Size: Normal)   Pulse 82   Ht 5\' 1"  (1.549 m)   Wt 137 lb 9.6 oz (62.4 kg)   SpO2 96%   BMI 26.00 kg/m   Wt Readings from Last 3 Encounters:  09/25/20 137 lb 9.6 oz (62.4 kg)  07/25/20 138 lb 6  oz (62.8 kg)  07/17/20 138 lb (62.6 kg)     Exam: General: Pt appears well and is in NAD  Lungs: Clear with good BS bilat with no rales, rhonchi, or wheezes  Heart: RRR with normal S1 and S2 and no gallops; no murmurs; no rub  Extremities: No pretibial edema.   Neuro: MS is good with appropriate affect, pt is alert and Ox3      DATA REVIEWED:  Lab Results  Component Value Date   HGBA1C 6.9 (A) 09/25/2020   HGBA1C 8.5 (A) 05/29/2020   HGBA1C 8.5 (H) 03/23/2020   Lab Results  Component Value Date   LDLCALC 142 (H) 08/09/2019   CREATININE 1.13 (H) 07/17/2020     Lab Results  Component Value Date   CHOL 228 (H) 03/23/2020   HDL 30.50 (L) 03/23/2020   LDLCALC 142 (H) 08/09/2019   LDLDIRECT 108.0 03/23/2020   TRIG (H) 03/23/2020    495.0 Triglyceride is over 400; calculations on  Lipids are invalid.   CHOLHDL 7 03/23/2020         ASSESSMENT / PLAN / RECOMMENDATIONS:   1) Type 2 Diabetes Mellitus, OPtimally controlled, With CKD III complications - Most recent A1c of 6.9   %. Goal A1c < 7.5 %.    - A1c at goal  - Pt with language and auditory barriers despite having his wife and interpreter line  - NO changes    MEDICATIONS: Continue Metformin 500 mg BID   EDUCATION / INSTRUCTIONS: BG monitoring instructions: Patient is instructed to check his blood sugars 1 times a day, fasting . Call  Endocrinology clinic if: BG persistently < 70  I reviewed the Rule of 15 for the treatment of hypoglycemia in detail with the patient. Literature supplied.    2) Dyslipidemia: Patient is on zetia and fenofibrate   - Tg elevated at 495 mg/dL this increases the risk of pancreatitis ,I have discussed this with the pt today, he has been out of his lipid loweing agents for a "while"  - Discussed low carb and low fat diet  - Will refill    Restart Zetia 10 mg daily  Increase Fenofibrate to 160 mg daily   F/U in 6 months     Signed electronically by: Lyndle Herrlich, Kevin Hutchinson  [facility] Endocrinology  [facility] [address] [place], [place] [address] Phone: [phone] FAX: [phone]   CC: Wanda Plump, Kevin Hutchinson [address] [place] [address] Phone: [phone]  Fax: [phone]  Return to Endocrinology clinic as below: Future Appointments  Date Time Provider Department Center  11/24/2020  8:00 AM Wanda Plump, Kevin Hutchinson [facility]

## 2020-09-25 NOTE — Patient Instructions (Signed)
-   Increase Metformin 500 mg to 1 tablet with Breakfast and 1 tablet with Supper      - T?ng Metformin 500 mg ln 1 vin vo B?a sng v 1 vin v?i B?a t?i

## 2020-09-28 ENCOUNTER — Other Ambulatory Visit (HOSPITAL_COMMUNITY): Payer: Self-pay

## 2020-10-05 ENCOUNTER — Other Ambulatory Visit (HOSPITAL_COMMUNITY): Payer: Self-pay

## 2020-10-09 ENCOUNTER — Other Ambulatory Visit (HOSPITAL_COMMUNITY): Payer: Self-pay

## 2020-10-11 ENCOUNTER — Other Ambulatory Visit (HOSPITAL_COMMUNITY): Payer: Self-pay

## 2020-10-29 ENCOUNTER — Other Ambulatory Visit: Payer: Self-pay | Admitting: Internal Medicine

## 2020-11-06 ENCOUNTER — Other Ambulatory Visit (HOSPITAL_COMMUNITY): Payer: Self-pay

## 2020-11-10 ENCOUNTER — Other Ambulatory Visit (HOSPITAL_COMMUNITY): Payer: Self-pay

## 2020-11-24 ENCOUNTER — Ambulatory Visit (INDEPENDENT_AMBULATORY_CARE_PROVIDER_SITE_OTHER): Payer: Medicare Other | Admitting: Internal Medicine

## 2020-11-24 ENCOUNTER — Encounter: Payer: Self-pay | Admitting: Internal Medicine

## 2020-11-24 ENCOUNTER — Other Ambulatory Visit: Payer: Self-pay

## 2020-11-24 VITALS — BP 118/70 | HR 73 | Temp 97.9°F | Resp 18 | Ht 61.0 in | Wt 138.2 lb

## 2020-11-24 DIAGNOSIS — E785 Hyperlipidemia, unspecified: Secondary | ICD-10-CM | POA: Diagnosis not present

## 2020-11-24 DIAGNOSIS — H9193 Unspecified hearing loss, bilateral: Secondary | ICD-10-CM

## 2020-11-24 DIAGNOSIS — Z23 Encounter for immunization: Secondary | ICD-10-CM | POA: Diagnosis not present

## 2020-11-24 DIAGNOSIS — E1169 Type 2 diabetes mellitus with other specified complication: Secondary | ICD-10-CM

## 2020-11-24 DIAGNOSIS — Z0000 Encounter for general adult medical examination without abnormal findings: Secondary | ICD-10-CM | POA: Diagnosis not present

## 2020-11-24 LAB — LIPID PANEL
Cholesterol: 215 mg/dL — ABNORMAL HIGH (ref 0–200)
HDL: 34 mg/dL — ABNORMAL LOW (ref >39.00)
LDL Cholesterol: 142 mg/dL — ABNORMAL HIGH (ref 0–99)
NonHDL: 180.84
Total CHOL/HDL Ratio: 6
Triglycerides: 193 mg/dL — ABNORMAL HIGH (ref 0.0–149.0)
VLDL: 38.6 mg/dL (ref 0.0–40.0)

## 2020-11-24 NOTE — Progress Notes (Signed)
Subjective:    Patient ID: Kevin Hutchinson, male    DOB: May 08, 1934, 86 y.o.   MRN: [id number]  DOS:  11/24/2020 Type of visit - description: CPX The patient is here with his wife and a Nurse, mental health. When asked, patient reports no major problem except occasional hot flashes. Denies any fever or chills. Denies any major problem with LUTS   Review of Systems  Other than above, a 14 point review of systems is negative      Past Medical History:  Diagnosis Date   GERD (gastroesophageal reflux disease)    EGD 5-09: : Duodenitis without Hemorrhage, Esophageal Stricture.   Hepatitis B    Hyperlipidemia    PPD positive    Was Rx INH x 3 months (2008)--HD      Past Surgical History:  Procedure Laterality Date   NO PAST SURGERIES     Social History   Socioeconomic History   Marital status: Married    Spouse name: Not on file   Number of children: 5   Years of education: Not on file   Highest education level: Not on file  Occupational History   Occupation: n/a  Tobacco Use   Smoking status: Former   Smokeless tobacco: Never   Tobacco comments:    quit in the 90s  Substance and Sexual Activity   Alcohol use: Yes    Comment: RARE   Drug use: No   Sexual activity: Not on file  Other Topics Concern   Not on file  Social History Narrative   Household: Lives w/ wife only   FROM [place]   Social Determinants of Health   Financial Resource Strain: Not on file  Food Insecurity: Not on file  Transportation Needs: Not on file  Physical Activity: Not on file  Stress: Not on file  Social Connections: Not on file  Intimate Partner Violence: Not on file    Allergies as of 11/24/2020   No Known Allergies      Medication List        Accurate as of November 24, 2020  2:11 PM. If you have any questions, ask your nurse or doctor.          ezetimibe 10 MG tablet Commonly known as: Zetia Take 1 tablet (10 mg total) by mouth daily.   fenofibrate 160 MG  tablet Take 1 tablet (160 mg total) by mouth daily.   metFORMIN 500 MG tablet Commonly known as: GLUCOPHAGE Take 1 tablet (500 mg total) by mouth 2 (two) times daily with a meal.   tamsulosin 0.4 MG Caps capsule Commonly known as: FLOMAX Take 0.4 mg by mouth daily.   tenofovir 300 MG tablet Commonly known as: VIREAD Take 1 tablet (300 mg total) by mouth every other day.           Objective:   Physical Exam BP 118/70 (BP Location: Left Arm, Patient Position: Sitting, Cuff Size: Small)   Pulse 73   Temp 97.9 F (36.6 C) (Oral)   Resp 18   Ht 5\' 1"  (1.549 m)   Wt 138 lb 4 oz (62.7 kg)   SpO2 98%   BMI 26.12 kg/m   General: Well developed, NAD, BMI noted Neck: No obvious thyromegaly HEENT:  Normocephalic . Face symmetric, atraumatic Lungs:  CTA B Normal respiratory effort, no intercostal retractions, no accessory muscle use. Heart: RRR,  no murmur.  Abdomen:  Not distended, soft, non-tender. No rebound or rigidity.   Lower extremities: no pretibial  edema bilaterally  Skin: Exposed areas without rash. Not pale. Not jaundice Neurologic:  alert & oriented X3.  Speech normal, gait appropriate for age and unassisted Strength symmetric and appropriate for age.  Psych: Cognition and judgment appear intact.  Cooperative with normal attention span and concentration.  Behavior appropriate. No anxious or depressed appearing.     Assessment    Assessment DM Anemia GERD: EGD 06-2007: duodenitis, esophageal stricture BPH, slightly increased PSA, declined to see urology 2015, 05/2017.   Chronic hepatitis B H/o + PPD, INH 3 months 2008 HOH, s/p hearing assessment 11/30/2014: Severe hearing loss, has hearing aids   PLAN Here for CPX Visit assisted by a video translator, patient is [facility], communication was limited. DM: Per Endo. BPH: last PSA elevated but is stable.  At age 17 and no major symptoms I will not recommend further eval. HOH: Request a referral fir hearing  aids, will arrange. RTC 1 year.   This visit occurred during the SARS-CoV-2 public health emergency.  Safety protocols were in place, including screening questions prior to the visit, additional usage of staff PPE, and extensive cleaning of exam room while observing appropriate contact time as indicated for disinfecting solutions.

## 2020-11-24 NOTE — Assessment & Plan Note (Signed)
Here for CPX Visit assisted by a video translator, patient is Kevin Hutchinson, communication was limited. DM: Per Endo. BPH: last PSA elevated but is stable.  At age 86 and no major symptoms I will not recommend further eval. HOH: Request a referral fir hearing aids, will arrange. RTC 1 year.

## 2020-11-24 NOTE — Patient Instructions (Signed)
Vaccinations I recommend: Tdap (tetanus shot) Shingrix COVID-vaccine booster in few months   GO TO THE LAB : Get the blood work     GO TO THE FRONT DESK, PLEASE SCHEDULE YOUR APPOINTMENTS Come back for   a physical exam in 1 year

## 2020-11-24 NOTE — Assessment & Plan Note (Signed)
-   Td--2011, booster recommended -PNM 23: 2009 , 2019 -prevnar 2015 - had zostavax before - shingrex: Recommended -Flu shot today - pfizer/covid shot x 4, last 06/2020.  Consider booster in few months -CCS: Cscope 5-09 @ [place]: TICs.  I would recommend no further screenings at age 86 -Prostate cancer screening: see BPH -Diet-exercise discussed   -Labs : Reviewed, will get a FLP

## 2020-11-29 ENCOUNTER — Other Ambulatory Visit (HOSPITAL_COMMUNITY): Payer: Self-pay

## 2020-12-20 ENCOUNTER — Other Ambulatory Visit (HOSPITAL_COMMUNITY): Payer: Self-pay

## 2020-12-22 ENCOUNTER — Other Ambulatory Visit (HOSPITAL_COMMUNITY): Payer: Self-pay

## 2021-01-08 ENCOUNTER — Other Ambulatory Visit (HOSPITAL_COMMUNITY): Payer: Self-pay

## 2021-01-08 ENCOUNTER — Other Ambulatory Visit: Payer: Self-pay | Admitting: Pharmacist

## 2021-01-08 DIAGNOSIS — B181 Chronic viral hepatitis B without delta-agent: Secondary | ICD-10-CM

## 2021-01-08 NOTE — Telephone Encounter (Signed)
Yes, ok to refill.   He needs a f/u scheduled with Dr. Drue Second as well.

## 2021-01-09 ENCOUNTER — Other Ambulatory Visit (HOSPITAL_COMMUNITY): Payer: Self-pay

## 2021-01-10 ENCOUNTER — Other Ambulatory Visit (HOSPITAL_COMMUNITY): Payer: Self-pay

## 2021-01-11 ENCOUNTER — Other Ambulatory Visit (HOSPITAL_COMMUNITY): Payer: Self-pay

## 2021-01-11 MED ORDER — TENOFOVIR DISOPROXIL FUMARATE 300 MG PO TABS
300.0000 mg | ORAL_TABLET | ORAL | 0 refills | Status: DC
  Filled 2021-01-11: qty 15, 30d supply, fill #0

## 2021-01-19 ENCOUNTER — Other Ambulatory Visit (HOSPITAL_COMMUNITY): Payer: Self-pay

## 2021-01-24 ENCOUNTER — Other Ambulatory Visit (HOSPITAL_COMMUNITY): Payer: Self-pay

## 2021-01-29 ENCOUNTER — Other Ambulatory Visit (HOSPITAL_COMMUNITY): Payer: Self-pay

## 2021-02-12 ENCOUNTER — Other Ambulatory Visit (HOSPITAL_COMMUNITY): Payer: Self-pay

## 2021-02-15 ENCOUNTER — Other Ambulatory Visit (HOSPITAL_COMMUNITY): Payer: Self-pay

## 2021-02-15 ENCOUNTER — Other Ambulatory Visit: Payer: Self-pay | Admitting: Internal Medicine

## 2021-02-15 DIAGNOSIS — B181 Chronic viral hepatitis B without delta-agent: Secondary | ICD-10-CM

## 2021-02-15 MED ORDER — TENOFOVIR DISOPROXIL FUMARATE 300 MG PO TABS
300.0000 mg | ORAL_TABLET | ORAL | 0 refills | Status: DC
  Filled 2021-02-15: qty 15, 30d supply, fill #0

## 2021-03-01 ENCOUNTER — Other Ambulatory Visit: Payer: Self-pay

## 2021-03-01 ENCOUNTER — Ambulatory Visit (INDEPENDENT_AMBULATORY_CARE_PROVIDER_SITE_OTHER): Payer: Medicare Other

## 2021-03-01 ENCOUNTER — Ambulatory Visit (INDEPENDENT_AMBULATORY_CARE_PROVIDER_SITE_OTHER): Payer: Medicare Other | Admitting: Internal Medicine

## 2021-03-01 ENCOUNTER — Other Ambulatory Visit (HOSPITAL_COMMUNITY): Payer: Self-pay

## 2021-03-01 ENCOUNTER — Encounter: Payer: Self-pay | Admitting: Internal Medicine

## 2021-03-01 VITALS — BP 112/73 | HR 81 | Temp 98.1°F | Wt 139.0 lb

## 2021-03-01 DIAGNOSIS — B181 Chronic viral hepatitis B without delta-agent: Secondary | ICD-10-CM | POA: Diagnosis not present

## 2021-03-01 DIAGNOSIS — Z79899 Other long term (current) drug therapy: Secondary | ICD-10-CM | POA: Diagnosis not present

## 2021-03-01 DIAGNOSIS — Z23 Encounter for immunization: Secondary | ICD-10-CM | POA: Diagnosis not present

## 2021-03-01 DIAGNOSIS — Z0000 Encounter for general adult medical examination without abnormal findings: Secondary | ICD-10-CM | POA: Diagnosis not present

## 2021-03-01 MED ORDER — TENOFOVIR DISOPROXIL FUMARATE 300 MG PO TABS
300.0000 mg | ORAL_TABLET | ORAL | 11 refills | Status: DC
  Filled 2021-03-01 – 2021-03-19 (×4): qty 30, 60d supply, fill #0
  Filled 2021-05-04: qty 30, 60d supply, fill #1
  Filled 2021-06-26: qty 30, 60d supply, fill #2
  Filled 2021-08-21: qty 30, 60d supply, fill #3
  Filled 2021-10-22: qty 30, 60d supply, fill #4
  Filled 2021-12-26: qty 30, 60d supply, fill #5
  Filled 2022-01-18 – 2022-02-08 (×2): qty 30, 60d supply, fill #6

## 2021-03-01 NOTE — Progress Notes (Signed)
RFV: follow up for chronic hepatitis b  Patient ID: Kevin Hutchinson, male   DOB: [birth year], 87 y.o.   MRN: [id number]  HPI Kevin Hutchinson is a 87yo M with chronic hepatitis B and DM, here for follow up on chronic hep B. Overall doing well. He has not had covid-19. Has been styaing at home mostly with his wife. He is hard of hearing, here with translator. No recent health scares.  Outpatient Encounter Medications as of 03/01/2021  Medication Sig   ezetimibe (ZETIA) 10 MG tablet Take 1 tablet (10 mg total) by mouth daily.   fenofibrate 160 MG tablet Take 1 tablet (160 mg total) by mouth daily.   metFORMIN (GLUCOPHAGE) 500 MG tablet Take 1 tablet (500 mg total) by mouth 2 (two) times daily with a meal.   tamsulosin (FLOMAX) 0.4 MG CAPS capsule Take 0.4 mg by mouth daily.   tenofovir (VIREAD) 300 MG tablet Take 1 tablet (300 mg total) by mouth every other day.   No facility-administered encounter medications on file as of 03/01/2021.     Patient Active Problem List   Diagnosis Date Noted   Diabetes ([place]) 12/11/2017   Seasonal allergies 06/05/2017   Elevated PSA 06/05/2017   PCP NOTES >>>>>>>>>>>>>>. 03/21/2016   Hearing loss 03/16/2015   Pain in joint, shoulder region 06/28/2013   Annual physical exam 01/01/2011   BPH (benign prostatic hyperplasia) 01/01/2011   DIZZINESS 05/16/2009   Dyslipidemia 06/15/2007   Hepatitis B, chronic ([place]) 06/15/2007   GERD 03/12/2007   PPD positive 03/12/2007   BACK PAIN 02/16/2007     Health Maintenance Due  Topic Date Due   Zoster Vaccines- Shingrix (1 of 2) Never done   TETANUS/TDAP  03/14/2019   COVID-19 Vaccine (5 - Booster for Pfizer series) 09/11/2020   FOOT EXAM  11/14/2020     Review of Systems Review of Systems  Constitutional: Negative for fever, chills, diaphoresis, activity change, appetite change, fatigue and unexpected weight change.  HENT: Negative for congestion, sore throat, rhinorrhea, sneezing, trouble swallowing and sinus  pressure.  Eyes: Negative for photophobia and visual disturbance.  Respiratory: Negative for cough, chest tightness, shortness of breath, wheezing and stridor.  Cardiovascular: Negative for chest pain, palpitations and leg swelling.  Gastrointestinal: Negative for nausea, vomiting, abdominal pain, diarrhea, constipation, blood in stool, abdominal distention and anal bleeding.  Genitourinary: Negative for dysuria, hematuria, flank pain and difficulty urinating.  Musculoskeletal: Negative for myalgias, back pain, joint swelling, arthralgias and gait problem.  Skin: Negative for color change, pallor, rash and wound.  Neurological: Negative for dizziness, tremors, weakness and light-headedness.  Hematological: Negative for adenopathy. Does not bruise/bleed easily.  Psychiatric/Behavioral: Negative for behavioral problems, confusion, sleep disturbance, dysphoric mood, decreased concentration and agitation.   Physical Exam   BP 112/73   Pulse 81   Temp 98.1 F (36.7 C) (Oral)   Wt 139 lb (63 kg)   SpO2 97%   BMI 26.26 kg/m   Physical Exam  Constitutional: He is oriented to person, place, and time. He appears well-developed and well-nourished. No distress.  HENT:  Mouth/Throat: Oropharynx is clear and moist. No oropharyngeal exudate.  Cardiovascular: Normal rate, regular rhythm and normal heart sounds. Exam reveals no gallop and no friction rub.  No murmur heard.  Pulmonary/Chest: Effort normal and breath sounds normal. No respiratory distress. He has no wheezes.  Abdominal: Soft. Bowel sounds are normal. He exhibits no distension. There is no tenderness.  Lymphadenopathy:  He has no cervical adenopathy.  Neurological: He is alert and oriented to person, place, and time.  Skin: Skin is warm and dry. No rash noted. No erythema.  Psychiatric: He has a normal mood and affect. His behavior is normal.   Lab Results  Component Value Date   HEPBSAB NON-REACTIVE 07/17/2020     CBC Lab  Results  Component Value Date   WBC 7.9 07/17/2020   RBC 5.81 (H) 07/17/2020   HGB 14.6 07/17/2020   HCT 45.1 07/17/2020   PLT 135 (L) 07/17/2020   MCV 77.6 (L) 07/17/2020   MCH 25.1 (L) 07/17/2020   MCHC 32.4 07/17/2020   RDW 14.2 07/17/2020   LYMPHSABS 3,460 06/14/2019   MONOABS 665 08/27/2016   EOSABS 307 06/14/2019    BMET Lab Results  Component Value Date   NA 136 07/17/2020   K 5.3 07/17/2020   CL 101 07/17/2020   CO2 30 07/17/2020   GLUCOSE 153 (H) 07/17/2020   BUN 20 07/17/2020   CREATININE 1.13 (H) 07/17/2020   CALCIUM 9.4 07/17/2020   GFRNONAA 67 11/11/2018   GFRAA 78 11/11/2018      Assessment and Plan  Chronic hep b = will refill his tenofovir. Will check labs to see if converted. Also do [place] surveillance with ultrasound  Long term medication management = will check kidney function and CMP  Health maintenance =will give him and his wife bivalent booster

## 2021-03-03 LAB — HEPATITIS B DNA, ULTRAQUANTITATIVE, PCR
Hepatitis B DNA (Calc): <1 Log IU/mL
Hepatitis B DNA: <10 IU/mL

## 2021-03-03 LAB — COMPREHENSIVE METABOLIC PANEL
AG Ratio: 1.5 (calc) (ref 1.0–2.5)
ALT: 19 U/L (ref 9–46)
AST: 18 U/L (ref 10–35)
Albumin: 4.6 g/dL (ref 3.6–5.1)
Alkaline phosphatase (APISO): 55 U/L (ref 35–144)
BUN/Creatinine Ratio: 13 (calc) (ref 6–22)
BUN: 16 mg/dL (ref 7–25)
CO2: 28 mmol/L (ref 20–32)
Calcium: 9.6 mg/dL (ref 8.6–10.3)
Chloride: 102 mmol/L (ref 98–110)
Creat: 1.24 mg/dL — ABNORMAL HIGH (ref 0.70–1.22)
Globulin: 3.1 g/dL (calc) (ref 1.9–3.7)
Glucose, Bld: 94 mg/dL (ref 65–99)
Potassium: 4.5 mmol/L (ref 3.5–5.3)
Sodium: 138 mmol/L (ref 135–146)
Total Bilirubin: 0.6 mg/dL (ref 0.2–1.2)
Total Protein: 7.7 g/dL (ref 6.1–8.1)

## 2021-03-03 LAB — HEPATITIS B E ANTIBODY: Hep B E Ab: REACTIVE — AB

## 2021-03-09 ENCOUNTER — Other Ambulatory Visit (HOSPITAL_COMMUNITY): Payer: Self-pay

## 2021-03-12 ENCOUNTER — Other Ambulatory Visit (HOSPITAL_COMMUNITY): Payer: Self-pay

## 2021-03-13 ENCOUNTER — Other Ambulatory Visit: Payer: Medicare Other

## 2021-03-14 ENCOUNTER — Other Ambulatory Visit: Payer: Self-pay

## 2021-03-14 ENCOUNTER — Ambulatory Visit
Admission: RE | Admit: 2021-03-14 | Discharge: 2021-03-14 | Disposition: A | Discharge status: Home / Self Care | Payer: Medicare Other | Source: Ambulatory Visit | Attending: Internal Medicine | Admitting: Internal Medicine

## 2021-03-14 DIAGNOSIS — K760 Fatty (change of) liver, not elsewhere classified: Secondary | ICD-10-CM | POA: Diagnosis not present

## 2021-03-14 DIAGNOSIS — B181 Chronic viral hepatitis B without delta-agent: Secondary | ICD-10-CM

## 2021-03-15 ENCOUNTER — Other Ambulatory Visit (HOSPITAL_COMMUNITY): Payer: Self-pay

## 2021-03-16 ENCOUNTER — Other Ambulatory Visit (HOSPITAL_COMMUNITY): Payer: Self-pay

## 2021-03-19 ENCOUNTER — Telehealth: Payer: Self-pay

## 2021-03-19 ENCOUNTER — Other Ambulatory Visit (HOSPITAL_COMMUNITY): Payer: Self-pay

## 2021-03-19 NOTE — Telephone Encounter (Signed)
[facility] Patient Advocate Encounter   I was successful in securing patient a $ 7500.00 grant from [employer] to provide copayment coverage for Viread.  The patient's out of pocket cost will be 0.00 monthly.     I have spoken with the patient.    The billing information is as follows and has been shared with [facility].        Dates of Eligibility: 03/19/21 through 02/24/22  Patient knows to call the office with questions or concerns.  Ileene Patrick, [facility] Patient [facility] Phone: [phone] Fax:  [phone]

## 2021-03-27 NOTE — Progress Notes (Signed)
Name: Kevin Hutchinson  Age/ Sex: 87 y.o., male   MRN/ DOB: [id number], Jan 18, 1935     PCP: Wanda Plump, MD   Reason for Endocrinology Evaluation: Type 2 Diabetes Mellitus  Initial Endocrine Consultative Visit: 05/29/2020    PATIENT IDENTIFIER: [name] is a 87 y.o. male with a past medical history of T2DM and hepatitis B. The patient has followed with Endocrinology clinic since 05/29/2020 for consultative assistance with management of his diabetes.  DIABETIC HISTORY:  [name] was diagnosed with DM in 2019. His hemoglobin A1c has ranged from  6.7%in 2021, peaking at 8.5% in 2022..  On his initial visit to our clinic he had an A1c of  8.5% , he was on Metformin only, which we increased    SUBJECTIVE:   During the last visit (09/25/2020): A1c 6.9%, we increased Metformin   Today (03/27/2021): [name]  is here for a follow up on diabetes management.He is accompanied by his wife today. Interpreter line was used  today.  He checks his blood sugars 0 times daily  Per wife he has been without metformin for 2 months, due to no refills?. Per ur records confirmation has been received through pharmacy 09/25/2020 for a year supply.   Denies nausea or diarrhea       HOME DIABETES REGIMEN:  Metformin 500 mg 1 tablet with Breakfast and 1 tablet with Supper     Statin: no ACE-I/ARB: no   METER DOWNLOAD SUMMARY: Did not bring     DIABETIC COMPLICATIONS: Microvascular complications:   Denies: CKD Last Eye Exam: Completed 06/2020  Macrovascular complications:   Denies: CAD, CVA, PVD   HISTORY:  Past Medical History:  Past Medical History:  Diagnosis Date   GERD (gastroesophageal reflux disease)    EGD 5-09: : Duodenitis without Hemorrhage, Esophageal Stricture.   Hepatitis B    Hyperlipidemia    PPD positive    Was Rx INH x 3 months (2008)--HD     Past Surgical History:  Past Surgical History:  Procedure Laterality Date   NO PAST SURGERIES     Social History:   reports that he has quit smoking. He has never used smokeless tobacco. He reports current alcohol use. He reports that he does not use drugs. Family History: No family history on file.   HOME MEDICATIONS: Allergies as of 03/28/2021   No Known Allergies      Medication List        Accurate as of March 27, 2021 11:34 AM. If you have any questions, ask your nurse or doctor.          ezetimibe 10 MG tablet Commonly known as: Zetia Take 1 tablet (10 mg total) by mouth daily.   fenofibrate 160 MG tablet Take 1 tablet (160 mg total) by mouth daily.   metFORMIN 500 MG tablet Commonly known as: GLUCOPHAGE Take 1 tablet (500 mg total) by mouth 2 (two) times daily with a meal.   tamsulosin 0.4 MG Caps capsule Commonly known as: FLOMAX Take 0.4 mg by mouth daily.   tenofovir 300 MG tablet Commonly known as: VIREAD Take 1 tablet (300 mg total) by mouth every other day.         OBJECTIVE:   Vital Signs: There were no vitals taken for this visit.  Wt Readings from Last 3 Encounters:  03/01/21 139 lb (63 kg)  11/24/20 138 lb 4 oz (62.7 kg)  09/25/20 137 lb 9.6 oz (62.4 kg)  Exam: General: Pt appears well and is in NAD  Lungs: Clear with good BS bilat with no rales, rhonchi, or wheezes  Heart: RRR with normal S1 and S2 and no gallops; no murmurs; no rub  Extremities: No pretibial edema.   Neuro: MS is good with appropriate affect, pt is alert and Ox3   DM Foot Exam 03/29/2021  The skin of the feet is intact without sores or ulcerations. The pedal pulses are 1+ on right and 1+ on left. The sensation is decreased to a screening 5.07, 10 gram monofilament on the left    DATA REVIEWED:  Lab Results  Component Value Date   HGBA1C 6.9 (A) 09/25/2020   HGBA1C 8.5 (A) 05/29/2020   HGBA1C 8.5 (H) 03/23/2020   Lab Results  Component Value Date   LDLCALC 142 (H) 11/24/2020   CREATININE 1.24 (H) 03/01/2021     Lab Results  Component Value Date   CHOL 215 (H)  11/24/2020   HDL 34.00 (L) 11/24/2020   LDLCALC 142 (H) 11/24/2020   LDLDIRECT 108.0 03/23/2020   TRIG 193.0 (H) 11/24/2020   CHOLHDL 6 11/24/2020        Latest Reference Range & Units 03/01/21 02:21  Sodium 135 - 146 mmol/L 138  Potassium 3.5 - 5.3 mmol/L 4.5  Chloride 98 - 110 mmol/L 102  CO2 20 - 32 mmol/L 28  Glucose 65 - 99 mg/dL 94  BUN 7 - 25 mg/dL 16  Creatinine 8.290.70 - 5.621.22 mg/dL 1.301.24 (H)  Calcium 8.6 - 10.3 mg/dL 9.6  BUN/Creatinine Ratio 6 - 22 (calc) 13  AG Ratio 1.0 - 2.5 (calc) 1.5  AST 10 - 35 U/L 18  ALT 9 - 46 U/L 19  Total Protein 6.1 - 8.1 g/dL 7.7  Total Bilirubin 0.2 - 1.2 mg/dL 0.6    ASSESSMENT / PLAN / RECOMMENDATIONS:   1) Type 2 Diabetes Mellitus, OPtimally controlled, With CKD III complications - Most recent A1c of 7.2  %. Goal A1c < 7.5 %.    - A1c at goal  - Pt with language and auditory barriers despite having his wife and interpreter line  -He has been without metformin for approximately 2 months, I am not sure if the language barrier has been an issue to the pharmacy but the patient has a year supply until August 2023. -He also told me that he continues to take metformin once a day, I made him worried AVS in [place] and sure enough instructions did say that he should be taking it twice a day.  We again verified the importance of following instructions -A new refill will be provided today  MEDICATIONS: Restart metformin 500 mg BID   EDUCATION / INSTRUCTIONS: BG monitoring instructions: Patient is instructed to check his blood sugars 1 times a day, fasting . Call [facility] if: BG persistently < 70  I reviewed the Rule of 15 for the treatment of hypoglycemia in detail with the patient. Literature supplied.    2) Dyslipidemia: Patient is on zetia and fenofibrate   - Tg elevated at 495 mg/dL this increases the risk of pancreatitis ,I have discussed this with the pt in the past .  Historically there has been imperfect  adherence to lipid-lowering agents  Continue Zetia 10 mg daily  Continue fenofibrate to 160 mg daily   F/U in 6 months     Signed electronically by: Lyndle HerrlichAbby Jaralla Jaaziel Peatross, MD  [facility]  [facility] [address] [place]  [address] [place], [place] [address] Phone: [phone] FAX: [phone]   CC: Wanda Plump, MD [address] [place] [address] Phone: [phone]  Fax: [phone]  Return to Endocrinology clinic as below: Future Appointments  Date Time Provider Department Center  03/28/2021  8:50 AM Reiss Mowrey, Konrad Dolores, MD [facility] None  08/27/2021 10:15 AM Judyann Munson, MD [facility]  11/26/2021  8:40 AM Wanda Plump, MD [facility]

## 2021-03-28 ENCOUNTER — Ambulatory Visit (INDEPENDENT_AMBULATORY_CARE_PROVIDER_SITE_OTHER): Payer: Medicare Other | Admitting: Internal Medicine

## 2021-03-28 ENCOUNTER — Encounter: Payer: Self-pay | Admitting: Internal Medicine

## 2021-03-28 ENCOUNTER — Other Ambulatory Visit: Payer: Self-pay

## 2021-03-28 VITALS — BP 112/68 | HR 75 | Wt 137.0 lb

## 2021-03-28 DIAGNOSIS — E785 Hyperlipidemia, unspecified: Secondary | ICD-10-CM | POA: Diagnosis not present

## 2021-03-28 DIAGNOSIS — E1169 Type 2 diabetes mellitus with other specified complication: Secondary | ICD-10-CM | POA: Diagnosis not present

## 2021-03-28 LAB — POCT GLUCOSE (DEVICE FOR HOME USE): Glucose Fasting, POC: 143 mg/dL — AB (ref 70–99)

## 2021-03-28 LAB — POCT GLYCOSYLATED HEMOGLOBIN (HGB A1C): Hemoglobin A1C: 7.2 % — AB (ref 4.0–5.6)

## 2021-03-28 MED ORDER — METFORMIN HCL 500 MG PO TABS: 500.0000 mg | ORAL_TABLET | Freq: Two times a day (BID) | ORAL | 3 refills | Status: DC

## 2021-03-28 NOTE — Patient Instructions (Signed)
-   Increase Metformin 500 mg to 1 tablet with Breakfast and 1 tablet with Supper      - T?ng Metformin 500 mg ln 1 vin vo B?a sng v 1 vin v?i B?a t?i 

## 2021-03-29 ENCOUNTER — Encounter: Payer: Self-pay | Admitting: Internal Medicine

## 2021-04-05 ENCOUNTER — Other Ambulatory Visit (HOSPITAL_COMMUNITY): Payer: Self-pay

## 2021-04-09 ENCOUNTER — Other Ambulatory Visit (HOSPITAL_COMMUNITY): Payer: Self-pay

## 2021-04-17 ENCOUNTER — Other Ambulatory Visit (HOSPITAL_COMMUNITY): Payer: Self-pay

## 2021-05-04 ENCOUNTER — Other Ambulatory Visit (HOSPITAL_COMMUNITY): Payer: Self-pay

## 2021-05-07 ENCOUNTER — Other Ambulatory Visit (HOSPITAL_COMMUNITY): Payer: Self-pay

## 2021-05-10 ENCOUNTER — Other Ambulatory Visit (HOSPITAL_COMMUNITY): Payer: Self-pay

## 2021-05-29 ENCOUNTER — Other Ambulatory Visit (HOSPITAL_COMMUNITY): Payer: Self-pay

## 2021-06-14 ENCOUNTER — Other Ambulatory Visit (HOSPITAL_COMMUNITY): Payer: Self-pay

## 2021-06-26 ENCOUNTER — Other Ambulatory Visit (HOSPITAL_COMMUNITY): Payer: Self-pay

## 2021-07-02 ENCOUNTER — Other Ambulatory Visit (HOSPITAL_COMMUNITY): Payer: Self-pay

## 2021-07-24 DIAGNOSIS — H40033 Anatomical narrow angle, bilateral: Secondary | ICD-10-CM | POA: Diagnosis not present

## 2021-07-24 DIAGNOSIS — E119 Type 2 diabetes mellitus without complications: Secondary | ICD-10-CM | POA: Diagnosis not present

## 2021-08-21 ENCOUNTER — Other Ambulatory Visit (HOSPITAL_COMMUNITY): Payer: Self-pay

## 2021-08-27 ENCOUNTER — Ambulatory Visit: Payer: Medicare Other | Admitting: Internal Medicine

## 2021-08-27 ENCOUNTER — Encounter: Payer: Self-pay | Admitting: Internal Medicine

## 2021-08-27 ENCOUNTER — Other Ambulatory Visit: Payer: Self-pay

## 2021-08-27 VITALS — BP 137/79 | HR 68 | Temp 97.5°F | Ht 62.6 in | Wt 135.0 lb

## 2021-08-27 DIAGNOSIS — B181 Chronic viral hepatitis B without delta-agent: Secondary | ICD-10-CM

## 2021-08-27 DIAGNOSIS — E7800 Pure hypercholesterolemia, unspecified: Secondary | ICD-10-CM

## 2021-08-27 DIAGNOSIS — K7460 Unspecified cirrhosis of liver: Secondary | ICD-10-CM

## 2021-08-27 HISTORY — DX: Unspecified cirrhosis of liver: K74.60

## 2021-08-29 ENCOUNTER — Other Ambulatory Visit (HOSPITAL_COMMUNITY): Payer: Self-pay

## 2021-08-31 LAB — LIPID PANEL
Cholesterol: 207 mg/dL — ABNORMAL HIGH (ref <200)
HDL: 31 mg/dL — ABNORMAL LOW (ref >=40)
LDL Cholesterol (Calc): 131 mg/dL (calc) — ABNORMAL HIGH
Non-HDL Cholesterol (Calc): 176 mg/dL (calc) — ABNORMAL HIGH (ref <130)
Total CHOL/HDL Ratio: 6.7 (calc) — ABNORMAL HIGH (ref <5.0)
Triglycerides: 318 mg/dL — ABNORMAL HIGH (ref <150)

## 2021-08-31 LAB — COMPREHENSIVE METABOLIC PANEL
AG Ratio: 1.7 (calc) (ref 1.0–2.5)
ALT: 21 U/L (ref 9–46)
AST: 19 U/L (ref 10–35)
Albumin: 4.6 g/dL (ref 3.6–5.1)
Alkaline phosphatase (APISO): 58 U/L (ref 35–144)
BUN/Creatinine Ratio: 18 (calc) (ref 6–22)
BUN: 22 mg/dL (ref 7–25)
CO2: 27 mmol/L (ref 20–32)
Calcium: 9.5 mg/dL (ref 8.6–10.3)
Chloride: 102 mmol/L (ref 98–110)
Creat: 1.25 mg/dL — ABNORMAL HIGH (ref 0.70–1.22)
Globulin: 2.7 g/dL (calc) (ref 1.9–3.7)
Glucose, Bld: 121 mg/dL — ABNORMAL HIGH (ref 65–99)
Potassium: 5 mmol/L (ref 3.5–5.3)
Sodium: 138 mmol/L (ref 135–146)
Total Bilirubin: 0.6 mg/dL (ref 0.2–1.2)
Total Protein: 7.3 g/dL (ref 6.1–8.1)

## 2021-08-31 LAB — HEPATITIS B SURFACE ANTIBODY,QUALITATIVE: Hep B S Ab: NONREACTIVE

## 2021-08-31 LAB — HEPATITIS B DNA, ULTRAQUANTITATIVE, PCR
Hepatitis B DNA: NOT DETECTED IU/mL
Hepatitis B virus DNA: NOT DETECTED Log IU/mL

## 2021-08-31 LAB — HEPATITIS B E ANTIBODY: Hep B E Ab: REACTIVE — AB

## 2021-09-02 NOTE — Progress Notes (Signed)
RFV: follow up for chronic hepatitis B  Patient ID: Kevin Hutchinson, male   DOB: Dec 26, 1934, 87 y.o.   MRN: [id number]  HPI Kevin Hutchinson is an 87yo M with chronic hepatitis B, DM, HLD who is here for follow up on chronic hep B, continues to take tenofovir. No issues in getting his medications. He is otherwise, in good health. He has dropped a few pounds due to better dietary intake.  Reviewed results of last labs and imaging  Outpatient Encounter Medications as of 08/27/2021  Medication Sig   ezetimibe (ZETIA) 10 MG tablet Take 1 tablet (10 mg total) by mouth daily.   fenofibrate 160 MG tablet Take 1 tablet (160 mg total) by mouth daily.   metFORMIN (GLUCOPHAGE) 500 MG tablet Take 1 tablet (500 mg total) by mouth 2 (two) times daily with a meal.   tenofovir (VIREAD) 300 MG tablet Take 1 tablet (300 mg total) by mouth every other day.   tamsulosin (FLOMAX) 0.4 MG CAPS capsule Take 0.4 mg by mouth daily. (Patient not taking: Reported on 08/27/2021)   No facility-administered encounter medications on file as of 08/27/2021.     Patient Active Problem List   Diagnosis Date Noted   Cirrhosis of liver without ascites, unspecified hepatic cirrhosis type (HCC) 08/27/2021   Diabetes ([facility]) 12/11/2017   Seasonal allergies 06/05/2017   Elevated PSA 06/05/2017   PCP NOTES >>>>>>>>>>>>>>. 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  HENT: Negative for congestion, sore throat, rhinorrhea, sneezing, trouble swallowing and sinus pressure.  Eyes: Negative for photophobia and visual disturbance.  Respiratory: Negative for cough, chest tightness, shortness of breath, wheezing and stridor.  Cardiovascular: Negative for chest pain, palpitations and leg swelling.  Gastrointestinal: Negative for nausea, vomiting, abdominal pain, diarrhea, constipation, blood in stool, abdominal distention and anal bleeding.  Genitourinary: Negative for dysuria, hematuria, flank pain and difficulty urinating.  Musculoskeletal: Negative for myalgias, back pain, joint swelling, arthralgias and gait problem.  Skin: Negative for color change, pallor, rash and wound.  Neurological: Negative for dizziness, tremors, weakness and light-headedness.  Hematological: Negative for adenopathy. Does not bruise/bleed easily.  Psychiatric/Behavioral: Negative for behavioral problems, confusion, sleep disturbance, dysphoric mood, decreased concentration and agitation.   Physical Exam   BP 137/79   Pulse 68   Temp (!) 97.5 F (36.4 C) (Oral)   Ht 5' 2.6" (1.59 m)   Wt 135 lb (61.2 kg)   SpO2 99%   BMI 24.22 kg/m   Physical Exam  Constitutional: He is oriented to person, place, and time. He appears well-developed and well-nourished. No distress.  HENT:  Mouth/Throat: Oropharynx is clear and moist. No oropharyngeal exudate.  Cardiovascular: Normal rate, regular rhythm and normal heart sounds. Exam reveals no gallop and no friction rub.  No murmur heard.  Pulmonary/Chest: Effort normal and breath sounds normal. No  respiratory distress. He has no wheezes.  Abdominal: Soft. Bowel sounds are normal. He exhibits no distension. There is no tenderness.  Lymphadenopathy:  He has no cervical adenopathy.  Neurological: He is alert and oriented to person, place, and time.  Skin: Skin is warm and dry. No  rash noted. No erythema.  Psychiatric: He has a normal mood and affect. His behavior is normal.   Lab Results  Component Value Date   HEPBSAB NON-REACTIVE 08/27/2021   No results found for: "RPR", "LABRPR"  CBC Lab Results  Component Value Date   WBC 7.9 07/17/2020   RBC 5.81 (H) 07/17/2020   HGB 14.6 07/17/2020   HCT 45.1 07/17/2020   PLT 135 (L) 07/17/2020   MCV 77.6 (L) 07/17/2020   MCH 25.1 (L) 07/17/2020   MCHC 32.4 07/17/2020   RDW 14.2 07/17/2020   LYMPHSABS 3,460 06/14/2019   MONOABS 665 08/27/2016   EOSABS 307 06/14/2019    BMET Lab Results  Component Value Date   NA 138 08/27/2021   K 5.0 08/27/2021   CL 102 08/27/2021   CO2 27 08/27/2021   GLUCOSE 121 (H) 08/27/2021   BUN 22 08/27/2021   CREATININE 1.25 (H) 08/27/2021   CALCIUM 9.5 08/27/2021   GFRNONAA 67 11/11/2018   GFRAA 78 11/11/2018    Assessment and Plan Chronic hepatitis b = will check labs to see that he is still has undetectable VL. Continue on tenofovir  Hcc surveillance = will plan to repeat limited U/S in 6 months  Long term medication management =will check cr

## 2021-09-03 ENCOUNTER — Ambulatory Visit
Admission: RE | Admit: 2021-09-03 | Discharge: 2021-09-03 | Disposition: A | Discharge status: Home / Self Care | Payer: Medicare Other | Source: Ambulatory Visit | Attending: Internal Medicine | Admitting: Internal Medicine

## 2021-09-03 DIAGNOSIS — Z8619 Personal history of other infectious and parasitic diseases: Secondary | ICD-10-CM | POA: Diagnosis not present

## 2021-09-03 DIAGNOSIS — B181 Chronic viral hepatitis B without delta-agent: Secondary | ICD-10-CM

## 2021-09-18 NOTE — Progress Notes (Unsigned)
Name: SIRRON FRANCESCONI  Age/ Sex: 87 y.o., male   MRN/ DOB: [id number], [birth year]     PCP: Wanda Plump, MD   Reason for Endocrinology Evaluation: Type 2 Diabetes Mellitus  Initial Endocrine Consultative Visit: 05/29/2020    PATIENT IDENTIFIER: [name] is a 87 y.o. male with a past medical history of T2DM and hepatitis B. The patient has followed with Endocrinology clinic since 05/29/2020 for consultative assistance with management of his diabetes.  DIABETIC HISTORY:  [name] was diagnosed with DM in 2019. His hemoglobin A1c has ranged from  6.7%in 2021, peaking at 8.5% in 2022..  On his initial visit to our clinic he had an A1c of  8.5% , he was on Metformin only, which we increased    SUBJECTIVE:   During the last visit (03/28/2021): A1c 7.2%, we restarted  Metformin   Today (09/19/2021): Mr. Gnau  is here for a follow up on diabetes management.He is accompanied by his wife today. Interpreter line was used  today.  He checks his blood sugars rarely    Denies nausea , vomiting or diarrhea     HOME DIABETES REGIMEN:  Metformin 500 mg 1 tablet with Breakfast and 1 tablet with Supper     Statin: no ACE-I/ARB: no   METER DOWNLOAD SUMMARY: Did not bring     DIABETIC COMPLICATIONS: Microvascular complications:   Denies: CKD Last Eye Exam: Completed 06/2020  Macrovascular complications:   Denies: CAD, CVA, PVD   HISTORY:  Past Medical History:  Past Medical History:  Diagnosis Date   GERD (gastroesophageal reflux disease)    EGD 5-09: : Duodenitis without Hemorrhage, Esophageal Stricture.   Hepatitis B    Hyperlipidemia    PPD positive    Was Rx INH x 3 months (2008)--HD     Past Surgical History:  Past Surgical History:  Procedure Laterality Date   NO PAST SURGERIES     Social History:  reports that he has quit smoking. He has never used smokeless tobacco. He reports that he does not currently use alcohol. He reports that he does not use  drugs. Family History: No family history on file.   HOME MEDICATIONS: Allergies as of 09/19/2021   No Known Allergies      Medication List        Accurate as of September 19, 2021  9:01 AM. If you have any questions, ask your nurse or doctor.          ezetimibe 10 MG tablet Commonly known as: Zetia Take 1 tablet (10 mg total) by mouth daily.   fenofibrate 160 MG tablet Take 1 tablet (160 mg total) by mouth daily.   metFORMIN 500 MG tablet Commonly known as: GLUCOPHAGE Take 1 tablet (500 mg total) by mouth 2 (two) times daily with a meal.   tamsulosin 0.4 MG Caps capsule Commonly known as: FLOMAX Take 0.4 mg by mouth daily.   tenofovir 300 MG tablet Commonly known as: VIREAD Take 1 tablet (300 mg total) by mouth every other day.         OBJECTIVE:   Vital Signs: BP 114/80 (BP Location: Left Arm, Patient Position: Sitting, Cuff Size: Small)   Pulse 81   Ht 5' 2.6" (1.59 m)   Wt 136 lb 6.4 oz (61.9 kg)   SpO2 98%   BMI 24.47 kg/m   Wt Readings from Last 3 Encounters:  09/19/21 136 lb 6.4 oz (61.9 kg)  08/27/21 135 lb (61.2 kg)  03/28/21 137 lb (62.1 kg)     Exam: General: Pt appears well and is in NAD  Lungs: Clear with good BS bilat with no rales, rhonchi, or wheezes  Heart: RRR   Extremities: No pretibial edema.   Neuro: MS is good with appropriate affect, pt is alert and Ox3   DM Foot Exam 03/29/2021  The skin of the feet is intact without sores or ulcerations. The pedal pulses are 1+ on right and 1+ on left. The sensation is decreased to a screening 5.07, 10 gram monofilament on the left    DATA REVIEWED:  Lab Results  Component Value Date   HGBA1C 7.2 (A) 03/28/2021   HGBA1C 6.9 (A) 09/25/2020   HGBA1C 8.5 (A) 05/29/2020    Latest Reference Range & Units 08/27/21 10:39  Sodium 135 - 146 mmol/L 138  Potassium 3.5 - 5.3 mmol/L 5.0  Chloride 98 - 110 mmol/L 102  CO2 20 - 32 mmol/L 27  Glucose 65 - 99 mg/dL 625 (H)  BUN 7 - 25 mg/dL 22   Creatinine 6.38 - 1.22 mg/dL 9.37 (H)  Calcium 8.6 - 10.3 mg/dL 9.5  BUN/Creatinine Ratio 6 - 22 (calc) 18  AG Ratio 1.0 - 2.5 (calc) 1.7  AST 10 - 35 U/L 19  ALT 9 - 46 U/L 21  Total Protein 6.1 - 8.1 g/dL 7.3  Total Bilirubin 0.2 - 1.2 mg/dL 0.6      Lab Results  Component Value Date   LDLCALC 131 (H) 08/27/2021   CREATININE 1.25 (H) 08/27/2021     Lab Results  Component Value Date   CHOL 207 (H) 08/27/2021   HDL 31 (L) 08/27/2021   LDLCALC 131 (H) 08/27/2021   LDLDIRECT 108.0 03/23/2020   TRIG 318 (H) 08/27/2021   CHOLHDL 6.7 (H) 08/27/2021        Latest Reference Range & Units 03/01/21 02:21  Sodium 135 - 146 mmol/L 138  Potassium 3.5 - 5.3 mmol/L 4.5  Chloride 98 - 110 mmol/L 102  CO2 20 - 32 mmol/L 28  Glucose 65 - 99 mg/dL 94  BUN 7 - 25 mg/dL 16  Creatinine 3.42 - 8.76 mg/dL 8.11 (H)  Calcium 8.6 - 10.3 mg/dL 9.6  BUN/Creatinine Ratio 6 - 22 (calc) 13  AG Ratio 1.0 - 2.5 (calc) 1.5  AST 10 - 35 U/L 18  ALT 9 - 46 U/L 19  Total Protein 6.1 - 8.1 g/dL 7.7  Total Bilirubin 0.2 - 1.2 mg/dL 0.6    ASSESSMENT / PLAN / RECOMMENDATIONS:   1) Type 2 Diabetes Mellitus, OPtimally controlled, With CKD III complications - Most recent A1c of 6.7  %. Goal A1c < 7.5 %.    - A1c at goal  - Pt with language and auditory barriers despite having his wife and interpreter line  - No changes    MEDICATIONS: Continue Metformin 500 mg , BID  EDUCATION / INSTRUCTIONS: BG monitoring instructions: Patient is instructed to check his blood sugars 1 times a day, fasting . Call [facility] if: BG persistently < 70  I reviewed the Rule of 15 for the treatment of hypoglycemia in detail with the patient. Literature supplied.     F/U in 6 months     Signed electronically by: Lyndle Herrlich, MD  [facility]  [facility] [address] [place] [place], [place] [address] Phone: [phone] FAX:  [phone]   CC: Wanda Plump, MD [address]  [place] [address] Phone: [phone]  Fax: [phone]  Return to Endocrinology clinic as below: Future Appointments  Date Time Provider Department Center  09/19/2021  9:30 AM Tanyika Barros, Konrad Dolores, MD [facility] None  11/26/2021  8:40 AM Wanda Plump, MD [facility]  03/04/2022 10:15 AM Judyann Munson, MD [facility]

## 2021-09-19 ENCOUNTER — Encounter: Payer: Self-pay | Admitting: Internal Medicine

## 2021-09-19 ENCOUNTER — Ambulatory Visit (INDEPENDENT_AMBULATORY_CARE_PROVIDER_SITE_OTHER): Payer: Medicare Other | Admitting: Internal Medicine

## 2021-09-19 VITALS — BP 114/80 | HR 81 | Ht 62.6 in | Wt 136.4 lb

## 2021-09-19 DIAGNOSIS — E1169 Type 2 diabetes mellitus with other specified complication: Secondary | ICD-10-CM | POA: Diagnosis not present

## 2021-09-19 LAB — POCT GLUCOSE (DEVICE FOR HOME USE): Glucose Fasting, POC: 132 mg/dL — AB (ref 70–99)

## 2021-09-19 LAB — POCT GLYCOSYLATED HEMOGLOBIN (HGB A1C): Hemoglobin A1C: 6.7 % — AB (ref 4.0–5.6)

## 2021-09-19 MED ORDER — METFORMIN HCL 500 MG PO TABS: 500.0000 mg | ORAL_TABLET | Freq: Two times a day (BID) | ORAL | 3 refills | Status: DC

## 2021-09-19 NOTE — Patient Instructions (Signed)
-   Metformin 500 mg to 1 tablet with Breakfast and 1 tablet with Supper    - Metformin 500 mg ln 1 vin vo B?a sng v 1 vin v?i B?a t?i  

## 2021-09-22 IMAGING — US US ABDOMEN LIMITED W/ ELASTOGRAPHY
2 series · 13 of 25 positions shown · non-contrast
Comparison: None.

CLINICAL DATA: Chronic viral hepatitis-B.

EXAM:
US ABDOMEN LIMITED - RIGHT UPPER QUADRANT
ULTRASOUND HEPATIC ELASTOGRAPHY
TECHNIQUE: Limited right upper quadrant abdominal ultrasound was performed. In
addition, ultrasound elastography evaluation of the liver was
performed. A region of interest was placed in the right lobe of the
liver. Following application of a compressive sonographic pulse,
shear waves were detected in the adjacent hepatic tissue and the
shear wave velocity was calculated. Multiple assessments were
performed at the selected site. Median shear wave velocity is
correlated to a Metavir fibrosis score.

[Series 1: us abdomen limited w/ elastography · 10 of 43 slices shown (1 of 2)]
[im 1/43]
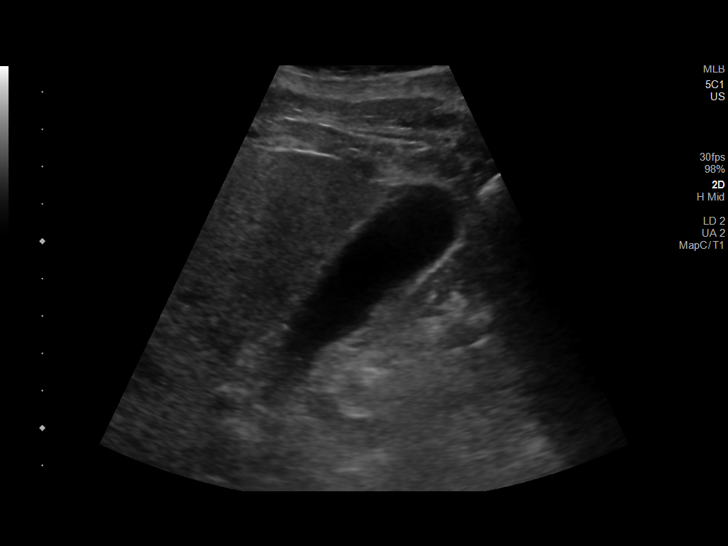
[im 5/43]
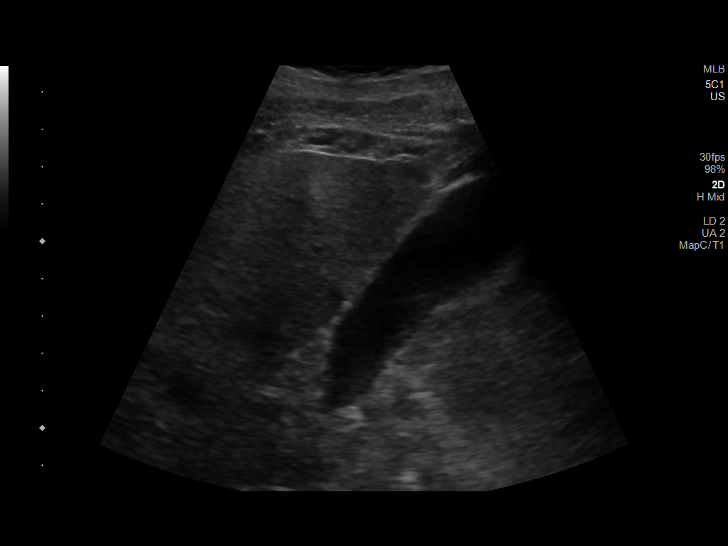
[im 10/43]
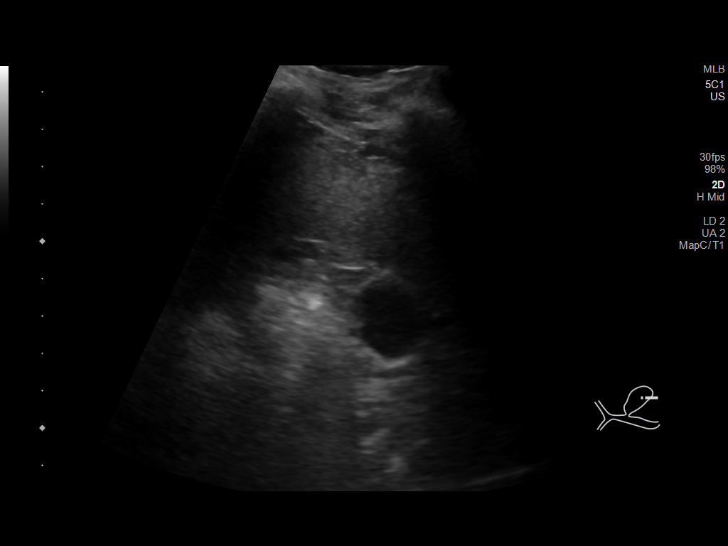
[im 15/43]
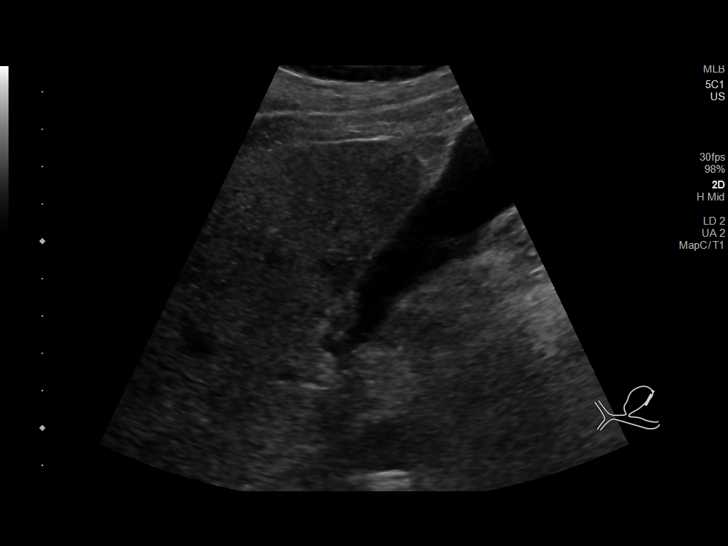
[im 19/43]
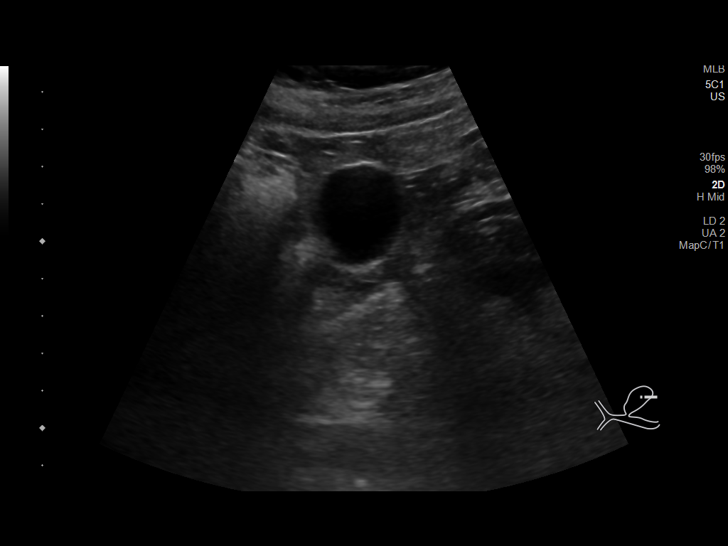
[im 24/43]
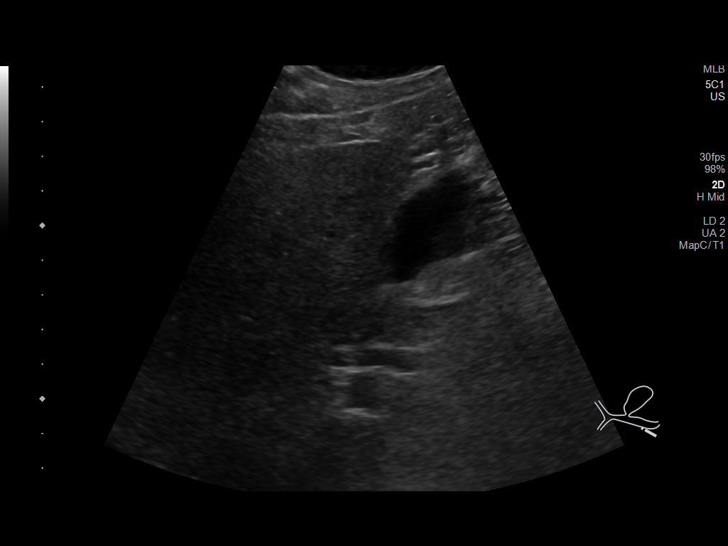
[im 29/43]
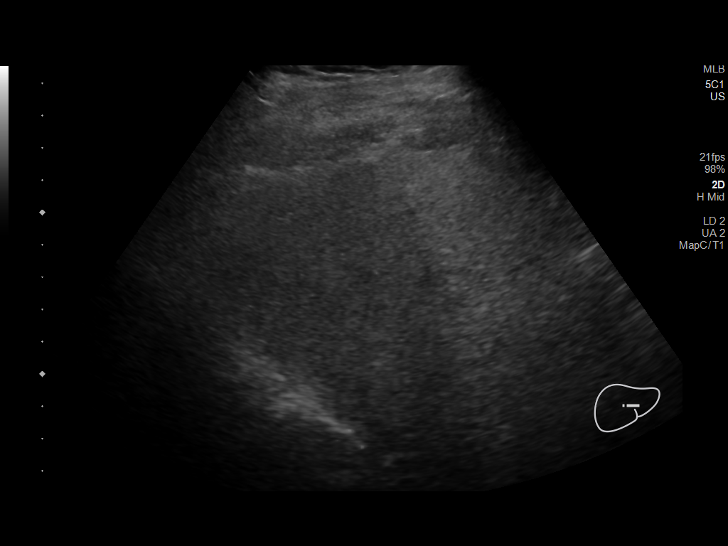
[im 33/43]
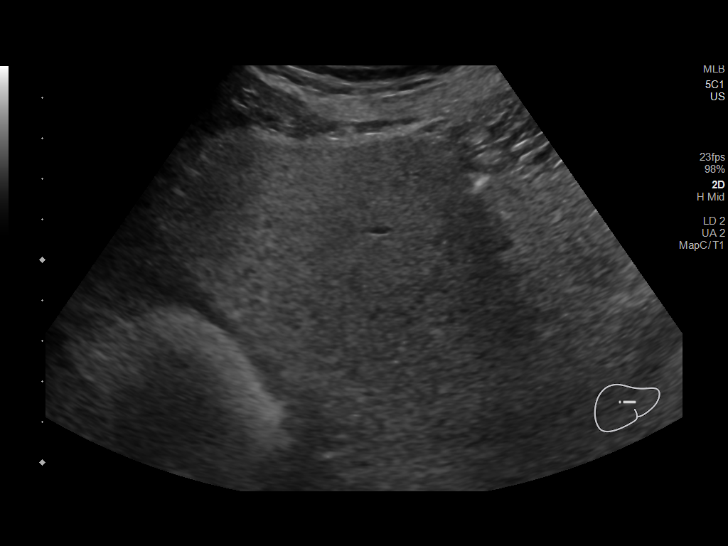
[im 38/43]
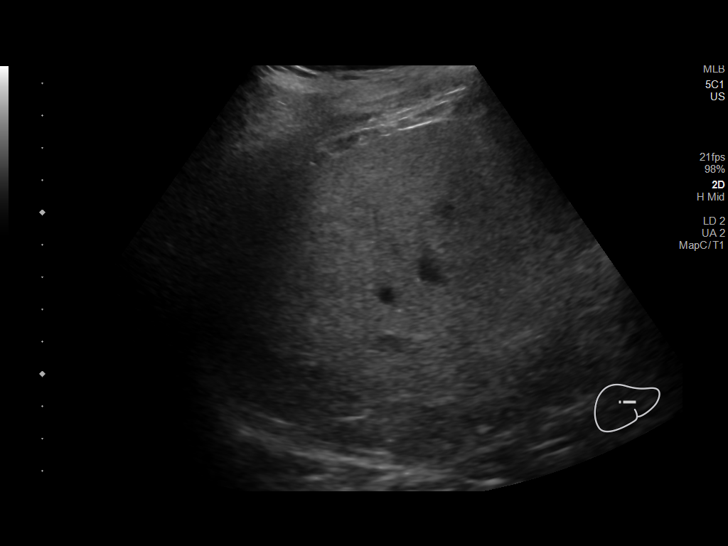
[im 43/43]
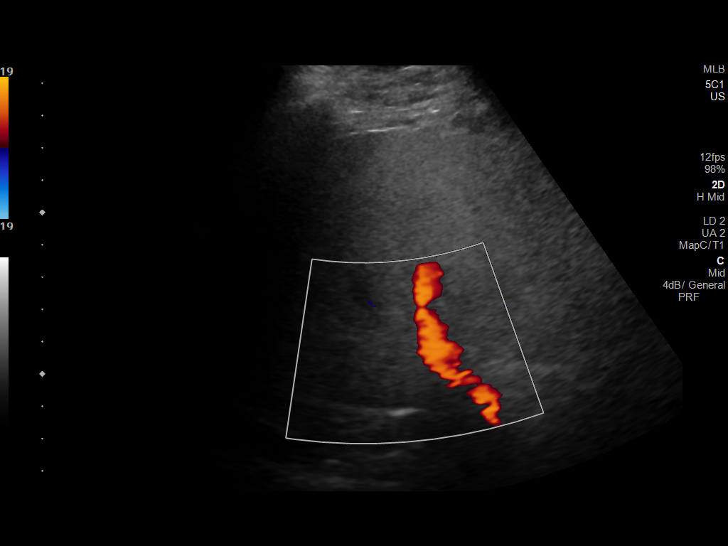

[Series 2: us abdomen limited w/ elastography · 3 of 13 slices shown (2 of 2)]
[im 3/13]
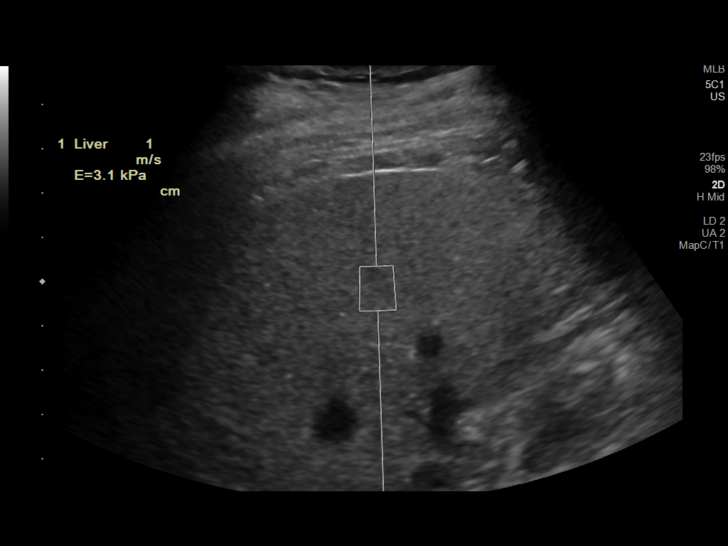
[im 8/13]
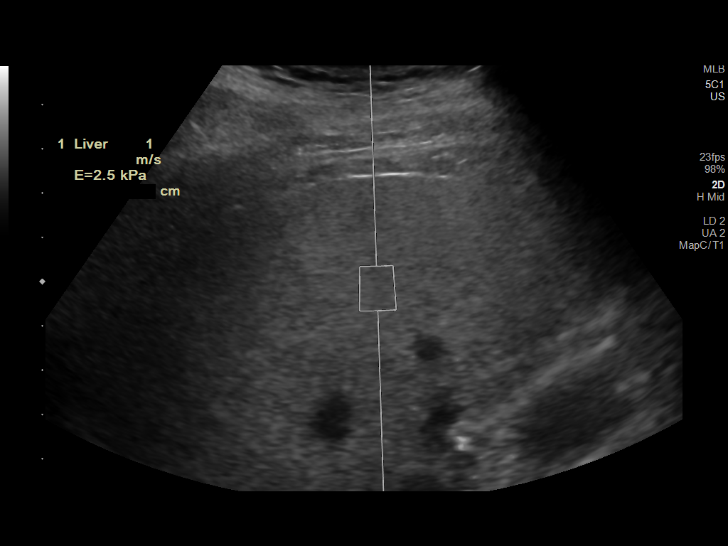
[im 13/13]
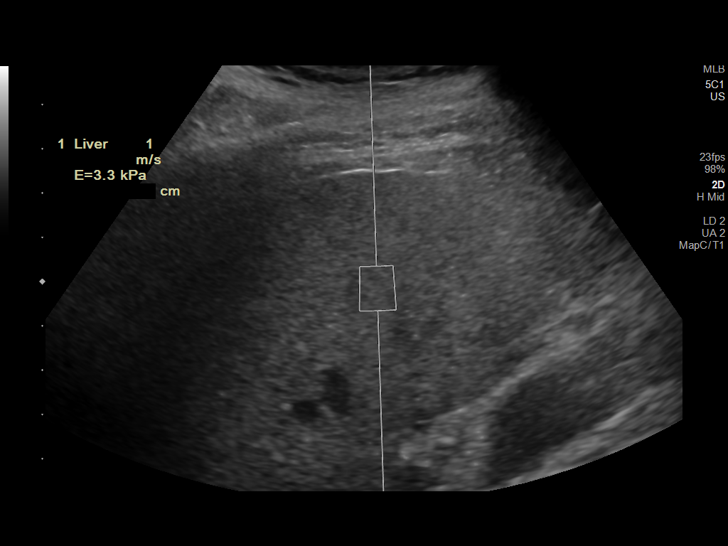

[13 of 25 positions shown; findings below may reference images not displayed]

FINDINGS: ULTRASOUND ABDOMEN LIMITED RIGHT UPPER QUADRANT

Gallbladder:

No gallstones or wall thickening visualized. No sonographic Murphy
sign noted.

Common bile duct:

Diameter: 6.3 mm

Liver:

Heterogeneous and increased echotexture of the liver. No mass lesion
identified. Portal vein is patent on color Doppler imaging with
normal direction of blood flow towards the liver.

ULTRASOUND HEPATIC ELASTOGRAPHY

Device: Siemens Helix VTQ

Patient position: Supine

Transducer 5 C1

Number of measurements: 10

Hepatic segment:  8

Median velocity:   1.03 m/sec

IQR:

IQR/Median velocity ratio:

Corresponding Metavir fibrosis score:  F0/F1

Risk of fibrosis: Minimal

Limitations of exam: Language barrier (despite having interpreter
present).

Please note that abnormal shear wave velocities may also be
identified in clinical settings other than with hepatic fibrosis,
such as: acute hepatitis, elevated right heart and central venous
pressures including use of beta blockers, Ologo disease
(Edlaet), infiltrative processes such as
mastocytosis/amyloidosis/infiltrative tumor, extrahepatic
cholestasis, in the post-prandial state, and liver transplantation.
Correlation with patient history, laboratory data, and clinical
condition recommended.
IMPRESSION: ULTRASOUND ABDOMEN:

1. Echogenic liver suggestive of hepatic steatosis.
2. No acute abnormality.

ULTRASOUND HEPATIC ELASTOGRAHY:

Median hepatic shear wave velocity is calculated at 1.03 m/sec.

Corresponding Metavir fibrosis score is F0/F1.

Risk of fibrosis is Minimal.

Follow-up: None required

## 2021-10-15 ENCOUNTER — Other Ambulatory Visit (HOSPITAL_COMMUNITY): Payer: Self-pay

## 2021-10-18 ENCOUNTER — Other Ambulatory Visit (HOSPITAL_COMMUNITY): Payer: Self-pay

## 2021-10-22 ENCOUNTER — Other Ambulatory Visit (HOSPITAL_COMMUNITY): Payer: Self-pay

## 2021-10-23 ENCOUNTER — Other Ambulatory Visit (HOSPITAL_COMMUNITY): Payer: Self-pay

## 2021-11-26 ENCOUNTER — Encounter: Payer: Self-pay | Admitting: Internal Medicine

## 2021-11-26 ENCOUNTER — Other Ambulatory Visit (HOSPITAL_COMMUNITY): Payer: Self-pay

## 2021-11-26 ENCOUNTER — Ambulatory Visit (INDEPENDENT_AMBULATORY_CARE_PROVIDER_SITE_OTHER): Payer: Medicare Other | Admitting: Internal Medicine

## 2021-11-26 VITALS — BP 126/68 | HR 68 | Temp 97.8°F | Resp 16 | Ht 62.6 in | Wt 135.0 lb

## 2021-11-26 DIAGNOSIS — Z0000 Encounter for general adult medical examination without abnormal findings: Secondary | ICD-10-CM

## 2021-11-26 DIAGNOSIS — Z0001 Encounter for general adult medical examination with abnormal findings: Secondary | ICD-10-CM

## 2021-11-26 DIAGNOSIS — K219 Gastro-esophageal reflux disease without esophagitis: Secondary | ICD-10-CM

## 2021-11-26 DIAGNOSIS — E785 Hyperlipidemia, unspecified: Secondary | ICD-10-CM | POA: Diagnosis not present

## 2021-11-26 DIAGNOSIS — N400 Enlarged prostate without lower urinary tract symptoms: Secondary | ICD-10-CM | POA: Diagnosis not present

## 2021-11-26 DIAGNOSIS — B181 Chronic viral hepatitis B without delta-agent: Secondary | ICD-10-CM | POA: Diagnosis not present

## 2021-11-26 LAB — CBC WITH DIFFERENTIAL/PLATELET
Basophils Absolute: 0.1 10*3/uL (ref 0.0–0.1)
Basophils Relative: 0.9 % (ref 0.0–3.0)
Eosinophils Absolute: 0.7 10*3/uL (ref 0.0–0.7)
Eosinophils Relative: 7.7 % — ABNORMAL HIGH (ref 0.0–5.0)
HCT: 43.1 % (ref 39.0–52.0)
Hemoglobin: 14.1 g/dL (ref 13.0–17.0)
Lymphocytes Relative: 38 % (ref 12.0–46.0)
Lymphs Abs: 3.3 10*3/uL (ref 0.7–4.0)
MCHC: 32.8 g/dL (ref 30.0–36.0)
MCV: 78.4 fl (ref 78.0–100.0)
Monocytes Absolute: 0.6 10*3/uL (ref 0.1–1.0)
Monocytes Relative: 7.2 % (ref 3.0–12.0)
Neutro Abs: 4 10*3/uL (ref 1.4–7.7)
Neutrophils Relative %: 46.2 % (ref 43.0–77.0)
Platelets: 124 10*3/uL — ABNORMAL LOW (ref 150.0–400.0)
RBC: 5.5 Mil/uL (ref 4.22–5.81)
RDW: 14.4 % (ref 11.5–15.5)
WBC: 8.7 10*3/uL (ref 4.0–10.5)

## 2021-11-26 LAB — IRON: Iron: 154 ug/dL (ref 42–165)

## 2021-11-26 LAB — FERRITIN: Ferritin: 72.9 ng/mL (ref 22.0–322.0)

## 2021-11-26 MED ORDER — FENOFIBRATE 160 MG PO TABS: 160.0000 mg | ORAL_TABLET | Freq: Every day | ORAL | 1 refills | Status: DC

## 2021-11-26 MED ORDER — PANTOPRAZOLE SODIUM 40 MG PO TBEC: 40.0000 mg | DELAYED_RELEASE_TABLET | Freq: Every day | ORAL | 0 refills | Status: DC

## 2021-11-26 MED ORDER — TAMSULOSIN HCL 0.4 MG PO CAPS: 0.4000 mg | ORAL_CAPSULE | Freq: Every day | ORAL | 1 refills | Status: DC

## 2021-11-26 MED ORDER — EZETIMIBE 10 MG PO TABS: 10.0000 mg | ORAL_TABLET | Freq: Every day | ORAL | 1 refills | Status: DC

## 2021-11-26 NOTE — Assessment & Plan Note (Addendum)
Here for CPX DM: Per Endo, last A1c 6.2. GERD, history of esophageal stricture:Has no heartburn but difficulty swallowing.  Since he has documented history of esophageal stricture, will restart PPIs and asked him to come back in 2 months.  If symptoms are still there, will refer to GI.  To call sooner if needed. High cholesterol: Last LDL 131, I've been reluctant to Rx statins d/t  h/o  hepatitis B rather Rx fenofibrate or Zetia but he is not taking them;  rec to restart.  Recheck labs on RTC Chronic hepatitis B: Per ID. BPH: minimal if any sxs , encouraged  compliance  w/ flomax RTC 2 months

## 2021-11-26 NOTE — Patient Instructions (Addendum)
Vaccines I recommend:  Shingrix (shingles) Tdap (tetanus) Covid booster RSV vaccine Flu shot   Please see the medication list, take the medications every day. Pantoprazole is a stomach medication, take it before your meals in the morning. If you have difficulty swallowing gets worse, call the office  Reviewed the information about a healthcare power of attorney   Do your blood work before you leave the office  Schedule follow-up in 2 months

## 2021-11-26 NOTE — Progress Notes (Signed)
Subjective:    Patient ID: Kevin Hutchinson, male    DOB: 1934-11-20, 87 y.o.   MRN: [id number]  DOS:  11/26/2021 Type of visit - description: CPX  CPX, here with his wife. Visit assisted by a video interpreter.    In general states he feels well. Did report some retrosternal discomfort for when he swallows food.  Sometimes the food gets stuck there. Denies any nausea vomiting.  No heartburn per se.  No change in the color of the stools.  Also, denies any exertional chest symptoms, no shortness of breath or palpitations.  Not taking medications as recommended.  Review of Systems  Other than above, a 14 point review of systems is negative     Past Medical History:  Diagnosis Date   GERD (gastroesophageal reflux disease)    EGD 5-09: : Duodenitis without Hemorrhage, Esophageal Stricture.   Hepatitis B    Hyperlipidemia    PPD positive    Was Rx INH x 3 months (2008)--HD      Past Surgical History:  Procedure Laterality Date   NO PAST SURGERIES     Social History   Socioeconomic History   Marital status: Married    Spouse name: Not on file   Number of children: 5   Years of education: Not on file   Highest education level: Not on file  Occupational History   Occupation: n/a  Tobacco Use   Smoking status: Former   Smokeless tobacco: Never   Tobacco comments:    quit in the 90s  Substance and Sexual Activity   Alcohol use: Not Currently    Comment: RARE   Drug use: No   Sexual activity: Not on file  Other Topics Concern   Not on file  Social History Narrative   Household: Lives w/ wife only   FROM [place]   5 children_ 3 live in [place] area    Social Determinants of Health   Financial Resource Strain: Not on file  Food Insecurity: Not on file  Transportation Needs: Not on file  Physical Activity: Not on file  Stress: Not on file  Social Connections: Not on file  Intimate Partner Violence: Not on file     Current Outpatient Medications  Medication  Instructions   ezetimibe (ZETIA) 10 mg, Oral, Daily   fenofibrate 160 mg, Oral, Daily   metFORMIN (GLUCOPHAGE) 500 mg, Oral, 2 times daily with meals   pantoprazole (PROTONIX) 40 mg, Oral, Daily before breakfast   tamsulosin (FLOMAX) 0.4 mg, Oral, Daily after supper   tenofovir (VIREAD) 300 mg, Oral, Every 48 hours       Objective:   Physical Exam BP 126/68   Pulse 68   Temp 97.8 F (36.6 C) (Oral)   Resp 16   Ht 5' 2.6" (1.59 m)   Wt 135 lb (61.2 kg)   SpO2 97%   BMI 24.22 kg/m  General: Well developed, NAD, BMI noted Neck: No  thyromegaly, no LAD's HEENT:  Normocephalic . Face symmetric, atraumatic HOH Lungs:  CTA B Normal respiratory effort, no intercostal retractions, no accessory muscle use. Heart: RRR,  no murmur.  Abdomen:  Not distended, soft, non-tender. No rebound or rigidity.   Lower extremities: no pretibial edema bilaterally  Skin: Exposed areas without rash. Not pale. Not jaundice Neurologic:  alert & oriented X3.  Speech normal, gait appropriate for age and unassisted Strength symmetric and appropriate for age.  Psych: Cognition and judgment appear intact.  Cooperative with normal attention  span and concentration.  Behavior appropriate. No anxious or depressed appearing.     Assessment     Assessment DM Anemia GERD: EGD 06-2007: duodenitis, esophageal stricture BPH, slightly increased PSA, declined to see urology 2015, 05/2017.   Chronic hepatitis B H/o + PPD, INH 3 months 2008 HOH, s/p hearing assessment 11/30/2014: Severe hearing loss, has hearing aids   PLAN Here for CPX DM: Per Endo, last A1c 6.2. GERD, history of esophageal stricture:Has no heartburn but difficulty swallowing.  Since he has documented history of esophageal stricture, will restart PPIs and asked him to come back in 2 months.  If symptoms are still there, will refer to GI.  To call sooner if needed. High cholesterol: Last LDL 131, I've been reluctant to Rx statins d/t   h/o  hepatitis B rather Rx fenofibrate or Zetia but he is not taking them;  rec to restart.  Recheck labs on RTC Chronic hepatitis B: Per ID. BPH: minimal if any sxs , encouraged  compliance  w/ flomax RTC 2 months   In addition to CPX, I review his chronic medical problems, I stressed the importance of good compliance with medication.Multiple refills sent.

## 2021-11-26 NOTE — Assessment & Plan Note (Signed)
-   Td--2011, booster recommended -PNM 23: 2009 , 2019 -prevnar 2015 -PNM 20: d/w pt - had zostavax before - shingrex: Recommended - COVID booster recommended -Flu shot declined to get it today -CCS: Cscope 5-09 @ [facility]: TICs.  I would recommend no further screenings at age 87 -Prostate cancer screening: last PSA slt elevated (5.8, close to baseline); rec no further screening d/t age; has declined see urology before  -Diet-exercise discussed   -Labs: `CBC, iron, ferritin - healthcare power of attorney info provided

## 2021-11-28 ENCOUNTER — Other Ambulatory Visit (HOSPITAL_COMMUNITY): Payer: Self-pay

## 2021-12-14 ENCOUNTER — Ambulatory Visit (INDEPENDENT_AMBULATORY_CARE_PROVIDER_SITE_OTHER): Payer: Medicare Other

## 2021-12-14 ENCOUNTER — Encounter: Payer: Self-pay | Admitting: Internal Medicine

## 2021-12-14 DIAGNOSIS — Z23 Encounter for immunization: Secondary | ICD-10-CM

## 2021-12-14 NOTE — Progress Notes (Signed)
Pt here for Influenza shot High Doze   Pt did  well Left Arm

## 2021-12-21 ENCOUNTER — Other Ambulatory Visit (HOSPITAL_COMMUNITY): Payer: Self-pay

## 2021-12-24 ENCOUNTER — Other Ambulatory Visit (HOSPITAL_COMMUNITY): Payer: Self-pay

## 2021-12-26 ENCOUNTER — Other Ambulatory Visit (HOSPITAL_COMMUNITY): Payer: Self-pay

## 2022-01-18 ENCOUNTER — Other Ambulatory Visit (HOSPITAL_COMMUNITY): Payer: Self-pay

## 2022-02-08 ENCOUNTER — Other Ambulatory Visit (HOSPITAL_COMMUNITY): Payer: Self-pay

## 2022-02-12 ENCOUNTER — Other Ambulatory Visit: Payer: Self-pay

## 2022-02-12 ENCOUNTER — Other Ambulatory Visit (HOSPITAL_COMMUNITY): Payer: Self-pay

## 2022-02-19 ENCOUNTER — Ambulatory Visit (INDEPENDENT_AMBULATORY_CARE_PROVIDER_SITE_OTHER): Payer: Medicare Other | Admitting: Internal Medicine

## 2022-02-19 VITALS — BP 122/70 | HR 77 | Temp 97.8°F | Resp 18 | Ht 62.6 in | Wt 138.2 lb

## 2022-02-19 DIAGNOSIS — E785 Hyperlipidemia, unspecified: Secondary | ICD-10-CM | POA: Diagnosis not present

## 2022-02-19 DIAGNOSIS — K219 Gastro-esophageal reflux disease without esophagitis: Secondary | ICD-10-CM

## 2022-02-19 DIAGNOSIS — E1169 Type 2 diabetes mellitus with other specified complication: Secondary | ICD-10-CM

## 2022-02-19 LAB — COMPREHENSIVE METABOLIC PANEL
ALT: 20 U/L (ref 0–53)
AST: 21 U/L (ref 0–37)
Albumin: 4.7 g/dL (ref 3.5–5.2)
Alkaline Phosphatase: 41 U/L (ref 39–117)
BUN: 24 mg/dL — ABNORMAL HIGH (ref 6–23)
CO2: 28 mEq/L (ref 19–32)
Calcium: 9.4 mg/dL (ref 8.4–10.5)
Chloride: 102 mEq/L (ref 96–112)
Creatinine, Ser: 1.46 mg/dL (ref 0.40–1.50)
GFR: 43.58 mL/min — ABNORMAL LOW (ref >60.00)
Glucose, Bld: 126 mg/dL — ABNORMAL HIGH (ref 70–99)
Potassium: 4.8 mEq/L (ref 3.5–5.1)
Sodium: 138 mEq/L (ref 135–145)
Total Bilirubin: 0.6 mg/dL (ref 0.2–1.2)
Total Protein: 7.6 g/dL (ref 6.0–8.3)

## 2022-02-19 LAB — CBC WITH DIFFERENTIAL/PLATELET
Basophils Absolute: 0.1 10*3/uL (ref 0.0–0.1)
Basophils Relative: 0.7 % (ref 0.0–3.0)
Eosinophils Absolute: 0.5 10*3/uL (ref 0.0–0.7)
Eosinophils Relative: 4.6 % (ref 0.0–5.0)
HCT: 45.7 % (ref 39.0–52.0)
Hemoglobin: 14.6 g/dL (ref 13.0–17.0)
Lymphocytes Relative: 45.4 % (ref 12.0–46.0)
Lymphs Abs: 4.5 10*3/uL — ABNORMAL HIGH (ref 0.7–4.0)
MCHC: 32 g/dL (ref 30.0–36.0)
MCV: 79.2 fl (ref 78.0–100.0)
Monocytes Absolute: 0.7 10*3/uL (ref 0.1–1.0)
Monocytes Relative: 6.6 % (ref 3.0–12.0)
Neutro Abs: 4.2 10*3/uL (ref 1.4–7.7)
Neutrophils Relative %: 42.7 % — ABNORMAL LOW (ref 43.0–77.0)
Platelets: 148 10*3/uL — ABNORMAL LOW (ref 150.0–400.0)
RBC: 5.77 Mil/uL (ref 4.22–5.81)
RDW: 14.8 % (ref 11.5–15.5)
WBC: 9.9 10*3/uL (ref 4.0–10.5)

## 2022-02-19 LAB — LIPID PANEL
Cholesterol: 189 mg/dL (ref 0–200)
HDL: 36.9 mg/dL — ABNORMAL LOW (ref >39.00)
LDL Cholesterol: 127 mg/dL — ABNORMAL HIGH (ref 0–99)
NonHDL: 152.58
Total CHOL/HDL Ratio: 5
Triglycerides: 128 mg/dL (ref 0.0–149.0)
VLDL: 25.6 mg/dL (ref 0.0–40.0)

## 2022-02-19 NOTE — Progress Notes (Signed)
   Subjective:    Patient ID: Kevin Hutchinson, male    DOB: 12-30-1934, 87 y.o.   MRN: [id number]  DOS:  02/19/2022 Type of visit - description: f/u  Here with translator. GERD: Reports good compliance with medications. He complained of dysphagia 2 months ago, restarted PPIs.  Now symptoms are essentially gone. Denies abdominal pain BMs are regular Stool color is normal although from time to time stools look a little dark.  On further questioning, there is no melena or blood in the stools.   Review of Systems See above   Past Medical History:  Diagnosis Date   GERD (gastroesophageal reflux disease)    EGD 5-09: : Duodenitis without Hemorrhage, Esophageal Stricture.   Hepatitis B    Hyperlipidemia    PPD positive    Was Rx INH x 3 months (2008)--HD      Past Surgical History:  Procedure Laterality Date   NO PAST SURGERIES      Current Outpatient Medications  Medication Instructions   ezetimibe (ZETIA) 10 mg, Oral, Daily   fenofibrate 160 mg, Oral, Daily   metFORMIN (GLUCOPHAGE) 500 mg, Oral, 2 times daily with meals   pantoprazole (PROTONIX) 40 mg, Oral, Daily before breakfast   tamsulosin (FLOMAX) 0.4 mg, Oral, Daily after supper   tenofovir (VIREAD) 300 mg, Oral, Every 48 hours       Objective:   Physical Exam BP 122/70 (BP Location: Left Arm, Patient Position: Sitting, Cuff Size: Normal)   Pulse 77   Temp 97.8 F (36.6 C) (Temporal)   Resp 18   Ht 5' 2.6" (1.59 m)   Wt 138 lb 3.2 oz (62.7 kg)   SpO2 97%   BMI 24.79 kg/m  General:   Well developed, NAD, BMI noted.  HEENT:  Normocephalic . Face symmetric, atraumatic Lungs:  CTA B Normal respiratory effort, no intercostal retractions, no accessory muscle use. Heart: RRR,  no murmur.  Abdomen:  Not distended, soft, non-tender. No rebound or rigidity.   Skin: Not pale. Not jaundice Lower extremities: no pretibial edema bilaterally  Neurologic:  alert & oriented X3.  Speech normal, gait appropriate for  age and unassisted Psych--  Cognition and judgment appear intact.  Cooperative with normal attention span and concentration.  Behavior appropriate. No anxious or depressed appearing.     Assessment      Assessment DM Anemia GERD: EGD 06-2007: duodenitis, esophageal stricture BPH, slightly increased PSA, declined to see urology 2015, 05/2017.   Chronic hepatitis B H/o + PPD, INH 3 months 2008 HOH, s/p hearing assessment 11/30/2014: Severe hearing loss, has hearing aids   PLAN GERD, history of esophageal structure: See LOV, had some symptoms, restarted PPIs, now asymptomatic. He did report occasional darker stools but no obvious melena. Plan: Continue PPIs, check CBC, stool test. High cholesterol: Since the last visit I asked him to comply with fenofibrate and Zetia, states he is.  Check FLP, BMP. RTC 4 to 5 months

## 2022-02-19 NOTE — Assessment & Plan Note (Signed)
GERD, history of esophageal structure: See LOV, had some symptoms, restarted PPIs, now asymptomatic. He did report occasional darker stools but no obvious melena. Plan: Continue PPIs, check CBC, stool test. High cholesterol: Since the last visit I asked him to comply with fenofibrate and Zetia, states he is.  Check FLP, BMP. RTC 4 to 5 months

## 2022-02-19 NOTE — Patient Instructions (Addendum)
Continue same medications.  If you need to refill, please call your pharmacy  Bring back the stool sample to your earliest convenience    GO TO THE LAB : Get the blood work     GO TO THE FRONT DESK, PLEASE SCHEDULE YOUR APPOINTMENTS Come back for a checkup in 4 to 5 months.

## 2022-02-21 ENCOUNTER — Other Ambulatory Visit (INDEPENDENT_AMBULATORY_CARE_PROVIDER_SITE_OTHER): Payer: Medicare Other

## 2022-02-21 DIAGNOSIS — K219 Gastro-esophageal reflux disease without esophagitis: Secondary | ICD-10-CM | POA: Diagnosis not present

## 2022-02-22 LAB — FECAL OCCULT BLOOD, IMMUNOCHEMICAL: Fecal Occult Bld: NEGATIVE

## 2022-02-22 MED ORDER — ROSUVASTATIN CALCIUM 10 MG PO TABS: 10.0000 mg | ORAL_TABLET | Freq: Every day | ORAL | 0 refills | Status: DC

## 2022-02-22 NOTE — Addendum Note (Signed)
Addended byConrad [place] D on: 02/22/2022 10:09 AM   Modules accepted: Orders

## 2022-03-04 ENCOUNTER — Ambulatory Visit (INDEPENDENT_AMBULATORY_CARE_PROVIDER_SITE_OTHER): Payer: Medicare Other | Admitting: Internal Medicine

## 2022-03-04 ENCOUNTER — Other Ambulatory Visit (HOSPITAL_COMMUNITY): Payer: Self-pay

## 2022-03-04 ENCOUNTER — Other Ambulatory Visit: Payer: Self-pay

## 2022-03-04 VITALS — BP 171/83 | HR 86 | Temp 98.4°F | Wt 135.0 lb

## 2022-03-04 DIAGNOSIS — L570 Actinic keratosis: Secondary | ICD-10-CM | POA: Diagnosis not present

## 2022-03-04 DIAGNOSIS — B181 Chronic viral hepatitis B without delta-agent: Secondary | ICD-10-CM | POA: Diagnosis not present

## 2022-03-04 MED ORDER — VEMLIDY 25 MG PO TABS
1.0000 | ORAL_TABLET | Freq: Every day | ORAL | 11 refills | Status: DC
  Filled 2022-03-04: qty 30, 30d supply, fill #0

## 2022-03-04 MED ORDER — TENOFOVIR DISOPROXIL FUMARATE 300 MG PO TABS
300.0000 mg | ORAL_TABLET | ORAL | 6 refills | Status: DC
  Filled 2022-03-04 – 2022-03-14 (×2): qty 30, 60d supply, fill #0
  Filled 2022-05-14: qty 30, 60d supply, fill #1
  Filled 2022-07-16: qty 30, 60d supply, fill #2

## 2022-03-04 NOTE — Progress Notes (Signed)
Patient ID: Kevin Hutchinson, male   DOB: 07/25/1934, 88 y.o.   MRN: [id number]  HPI Kevin Hutchinson is a 88yo M with chronic hepatitis b, HLD, GERD, T2DM, who is on tenofovir for chronic hep B. Hep B E ab positive, VL undetectable (July 2023)  Doing well overall, no issues with medication and his overall health  ROS: pruritus, dry skin -- but also has occasionally new detergent Outpatient Encounter Medications as of 03/04/2022  Medication Sig   ezetimibe (ZETIA) 10 MG tablet Take 1 tablet (10 mg total) by mouth daily.   fenofibrate 160 MG tablet Take 1 tablet (160 mg total) by mouth daily.   metFORMIN (GLUCOPHAGE) 500 MG tablet Take 1 tablet (500 mg total) by mouth 2 (two) times daily with a meal.   pantoprazole (PROTONIX) 40 MG tablet Take 1 tablet (40 mg total) by mouth daily before breakfast.   rosuvastatin (CRESTOR) 10 MG tablet Take 1 tablet (10 mg total) by mouth at bedtime.   tamsulosin (FLOMAX) 0.4 MG CAPS capsule Take 1 capsule (0.4 mg total) by mouth daily after supper.   tenofovir (VIREAD) 300 MG tablet Take 1 tablet (300 mg total) by mouth every other day.   No facility-administered encounter medications on file as of 03/04/2022.     Patient Active Problem List   Diagnosis Date Noted   Cirrhosis of liver without ascites, unspecified hepatic cirrhosis type ([place]) 08/27/2021   Diabetes ([place]) 12/11/2017   Seasonal allergies 06/05/2017   Elevated PSA 06/05/2017   PCP NOTES >>>>>>>>>>>>>>. 03/21/2016   Hearing loss 03/16/2015   Pain in joint, shoulder region 06/28/2013   Annual physical exam 01/01/2011   BPH (benign prostatic hyperplasia) 01/01/2011   DIZZINESS 05/16/2009   Dyslipidemia 06/15/2007   Hepatitis B, chronic ([place]) 06/15/2007   GERD 03/12/2007   PPD positive 03/12/2007   BACK PAIN 02/16/2007     Health Maintenance Due  Topic Date Due   Medicare Annual Wellness (AWV)  Never done   Diabetic kidney evaluation - Urine ACR  Never done   Zoster Vaccines- Shingrix (1  of 2) Never done   DTaP/Tdap/Td (2 - Tdap) 03/14/2019   OPHTHALMOLOGY EXAM  05/29/2021   COVID-19 Vaccine (6 - 2023-24 season) 10/26/2021     Review of Systems 12 point ros is negative except what is mentioned above Physical Exam   BP (!) 171/83   Pulse 86   Temp 98.4 F (36.9 C) (Temporal)   Wt 135 lb (61.2 kg)   SpO2 99%   BMI 24.22 kg/m    Physical Exam  Constitutional: He is oriented to person, place, and time. He appears well-developed and well-nourished. No distress.  HENT:  Mouth/Throat: Oropharynx is clear and moist. No oropharyngeal exudate.  Cardiovascular: Normal rate, regular rhythm and normal heart sounds. Exam reveals no gallop and no friction rub.  No murmur heard.  Pulmonary/Chest: Effort normal and breath sounds normal. No respiratory distress. He has no wheezes.  Abdominal: Soft. Bowel sounds are normal. He exhibits no distension. There is no tenderness.  Lymphadenopathy:  He has no cervical adenopathy.  Neurological: He is alert and oriented to person, place, and time.  Skin: Skin is warm and dry. No rash noted. No erythema. Scattered ak lesions Psychiatric: He has a normal mood and affect. His behavior is normal.   Lab Results  Component Value Date   HEPBSAB NON-REACTIVE 08/27/2021   No results found for: "RPR", "LABRPR"  CBC Lab Results  Component Value Date  WBC 9.9 02/19/2022   RBC 5.77 02/19/2022   HGB 14.6 02/19/2022   HCT 45.7 02/19/2022   PLT 148.0 (L) 02/19/2022   MCV 79.2 02/19/2022   MCH 25.1 (L) 07/17/2020   MCHC 32.0 02/19/2022   RDW 14.8 02/19/2022   LYMPHSABS 4.5 (H) 02/19/2022   MONOABS 0.7 02/19/2022   EOSABS 0.5 02/19/2022    BMET Lab Results  Component Value Date   NA 138 02/19/2022   K 4.8 02/19/2022   CL 102 02/19/2022   CO2 28 02/19/2022   GLUCOSE 126 (H) 02/19/2022   BUN 24 (H) 02/19/2022   CREATININE 1.46 02/19/2022   CALCIUM 9.4 02/19/2022   GFRNONAA 67 11/11/2018   GFRAA 78 11/11/2018       Assessment and Plan  Will check hep b labs, continue with tenfovir, and also will have check U/S for [facility] surveillance Plan to continue with tenofovir (will see if taf is covered)  Will see if can get TAF (vemlidy) - discussed that the dosage of TDF 300 = to TAF 25mg --  Rash = actinic keratitis? Will refer to dermatology; in the meantime, appears dry. Will do moisturizing cream. May need cryotherapy. Will refer to dermatogy; will change detergent to sensitive skin  Addendum = continue on tenofovir 300mg  Q 48hr, since vemlidy  I have personally spent 40 minutes involved in face-to-face and non-face-to-face activities for this patient on the day of the visit. Professional time spent includes the following activities: Preparing to see the patient (review of tests), Obtaining and/or reviewing separately obtained history (admission/discharge record), Performing a medically appropriate examination and/or evaluation , Ordering medications/tests/procedures, referring and communicating with other health care professionals, Documenting clinical information in the EMR, Independently interpreting results (not separately reported), Communicating results to the patient/family/caregiver, Counseling and educating the patient/family/caregiver and Care coordination (not separately reported).

## 2022-03-06 LAB — COMPREHENSIVE METABOLIC PANEL
AG Ratio: 1.5 (calc) (ref 1.0–2.5)
ALT: 19 U/L (ref 9–46)
AST: 20 U/L (ref 10–35)
Albumin: 5.1 g/dL (ref 3.6–5.1)
Alkaline phosphatase (APISO): 48 U/L (ref 35–144)
BUN/Creatinine Ratio: 15 (calc) (ref 6–22)
BUN: 22 mg/dL (ref 7–25)
CO2: 28 mmol/L (ref 20–32)
Calcium: 10.1 mg/dL (ref 8.6–10.3)
Chloride: 103 mmol/L (ref 98–110)
Creat: 1.48 mg/dL — ABNORMAL HIGH (ref 0.70–1.22)
Globulin: 3.3 g/dL (calc) (ref 1.9–3.7)
Glucose, Bld: 116 mg/dL — ABNORMAL HIGH (ref 65–99)
Potassium: 4.7 mmol/L (ref 3.5–5.3)
Sodium: 140 mmol/L (ref 135–146)
Total Bilirubin: 0.5 mg/dL (ref 0.2–1.2)
Total Protein: 8.4 g/dL — ABNORMAL HIGH (ref 6.1–8.1)

## 2022-03-06 LAB — HEPATITIS B DNA, ULTRAQUANTITATIVE, PCR
Hepatitis B DNA: NOT DETECTED IU/mL
Hepatitis B virus DNA: NOT DETECTED Log IU/mL

## 2022-03-06 LAB — HEPATITIS B SURFACE ANTIGEN: Hepatitis B Surface Ag: REACTIVE — AB

## 2022-03-07 ENCOUNTER — Other Ambulatory Visit (HOSPITAL_COMMUNITY): Payer: Self-pay

## 2022-03-12 ENCOUNTER — Ambulatory Visit (HOSPITAL_COMMUNITY)
Admission: RE | Admit: 2022-03-12 | Discharge: 2022-03-12 | Disposition: A | Discharge status: Home / Self Care | Payer: Medicare Other | Source: Ambulatory Visit | Attending: Internal Medicine | Admitting: Internal Medicine

## 2022-03-12 DIAGNOSIS — K760 Fatty (change of) liver, not elsewhere classified: Secondary | ICD-10-CM | POA: Diagnosis not present

## 2022-03-12 DIAGNOSIS — B181 Chronic viral hepatitis B without delta-agent: Secondary | ICD-10-CM | POA: Insufficient documentation

## 2022-03-13 ENCOUNTER — Other Ambulatory Visit (HOSPITAL_COMMUNITY): Payer: Self-pay

## 2022-03-14 ENCOUNTER — Other Ambulatory Visit (HOSPITAL_COMMUNITY): Payer: Self-pay

## 2022-03-22 ENCOUNTER — Ambulatory Visit: Payer: Medicare Other | Admitting: Internal Medicine

## 2022-03-22 ENCOUNTER — Encounter: Payer: Self-pay | Admitting: Internal Medicine

## 2022-03-22 VITALS — BP 139/72 | HR 100 | Ht 62.6 in | Wt 136.0 lb

## 2022-03-22 DIAGNOSIS — E1122 Type 2 diabetes mellitus with diabetic chronic kidney disease: Secondary | ICD-10-CM | POA: Diagnosis not present

## 2022-03-22 DIAGNOSIS — N1832 Chronic kidney disease, stage 3b: Secondary | ICD-10-CM

## 2022-03-22 DIAGNOSIS — E1142 Type 2 diabetes mellitus with diabetic polyneuropathy: Secondary | ICD-10-CM

## 2022-03-22 LAB — POCT GLYCOSYLATED HEMOGLOBIN (HGB A1C): Hemoglobin A1C: 6.6 % — AB (ref 4.0–5.6)

## 2022-03-22 LAB — POCT GLUCOSE (DEVICE FOR HOME USE): Glucose Fasting, POC: 139 mg/dL — AB (ref 70–99)

## 2022-03-22 MED ORDER — METFORMIN HCL ER 500 MG PO TB24: 500.0000 mg | ORAL_TABLET | Freq: Two times a day (BID) | ORAL | 3 refills | Status: DC

## 2022-03-22 NOTE — Patient Instructions (Signed)
-  Metformin 500 mg to 1 tablet with Breakfast and 1 tablet with Supper    - Metformin 500 mg ln 1 vin vo B?a sng v 1 vin v?i B?a t?i

## 2022-03-22 NOTE — Progress Notes (Signed)
Name: Kevin Hutchinson  Age/ Sex: 88 y.o., male   MRN/ DOB: [id number], 1935/01/26     PCP: Colon Branch, MD   Reason for Endocrinology Evaluation: Type 2 Diabetes Mellitus  Initial Endocrine Consultative Visit: 05/29/2020    PATIENT IDENTIFIER: [name] is a 88 y.o. male with a past medical history of T2DM and hepatitis B. The patient has followed with Endocrinology clinic since 05/29/2020 for consultative assistance with management of his diabetes.  DIABETIC HISTORY:  Kevin Hutchinson was diagnosed with DM in 2019. His hemoglobin A1c has ranged from  6.7%in 2021, peaking at 8.5% in 2022..  On his initial visit to our clinic he had an A1c of  8.5% , he was on Metformin only, which we increased    SUBJECTIVE:   During the last visit (09/19/2021): A1c 6.7%  Today (03/22/2022): Kevin Hutchinson  is here for a follow up on diabetes management.He is accompanied by his wife today. Interpreter line was used  today.  He checks his blood sugars occasionally.  He did not bring his meter today   Denies nausea , vomiting or diarrhea  He has been avoiding sugar-sweetened foods and beverages     HOME DIABETES REGIMEN:  Metformin 500 mg 1 tablet with Breakfast and 1 tablet with Supper     Statin: no ACE-I/ARB: no   METER DOWNLOAD SUMMARY: Did not bring     DIABETIC COMPLICATIONS: Microvascular complications:   Denies: CKD Last Eye Exam: Completed 06/2020  Macrovascular complications:   Denies: CAD, CVA, PVD   HISTORY:  Past Medical History:  Past Medical History:  Diagnosis Date   GERD (gastroesophageal reflux disease)    EGD 5-09: : Duodenitis without Hemorrhage, Esophageal Stricture.   Hepatitis B    Hyperlipidemia    PPD positive    Was Rx INH x 3 months (2008)--HD     Past Surgical History:  Past Surgical History:  Procedure Laterality Date   NO PAST SURGERIES     Social History:  reports that he has quit smoking. He has never used smokeless tobacco. He reports that he  does not currently use alcohol. He reports that he does not use drugs. Family History: No family history on file.   HOME MEDICATIONS: Allergies as of 03/22/2022   No Known Allergies      Medication List        Accurate as of March 22, 2022  9:34 AM. If you have any questions, ask your nurse or doctor.          ezetimibe 10 MG tablet Commonly known as: Zetia Take 1 tablet (10 mg total) by mouth daily.   fenofibrate 160 MG tablet Take 1 tablet (160 mg total) by mouth daily.   metFORMIN 500 MG tablet Commonly known as: GLUCOPHAGE Take 1 tablet (500 mg total) by mouth 2 (two) times daily with a meal.   pantoprazole 40 MG tablet Commonly known as: PROTONIX Take 1 tablet (40 mg total) by mouth daily before breakfast.   rosuvastatin 10 MG tablet Commonly known as: Crestor Take 1 tablet (10 mg total) by mouth at bedtime.   tamsulosin 0.4 MG Caps capsule Commonly known as: FLOMAX Take 1 capsule (0.4 mg total) by mouth daily after supper.   tenofovir 300 MG tablet Commonly known as: VIREAD Take 1 tablet (300 mg total) by mouth every other day.         OBJECTIVE:   Vital Signs: BP 139/72 (BP Location: Left Arm, Patient  Position: Sitting, Cuff Size: Small)   Pulse 100   Ht 5' 2.6" (1.59 m)   Wt 136 lb (61.7 kg)   SpO2 97%   BMI 24.40 kg/m   Wt Readings from Last 3 Encounters:  03/22/22 136 lb (61.7 kg)  03/04/22 135 lb (61.2 kg)  02/19/22 138 lb 3.2 oz (62.7 kg)     Exam: General: Pt appears well and is in NAD  Lungs: Clear with good BS bilat with no rales, rhonchi, or wheezes  Heart: RRR   Extremities: No pretibial edema.   Neuro: MS is good with appropriate affect, pt is alert and Ox3   DM Foot Exam 03/22/2022  The skin of the feet is intact without sores or ulcerations. The pedal pulses are 1+ on right and 1+ on left. The sensation is decreased to a screening 5.07, 10 gram monofilament on the left    DATA REVIEWED:  Lab Results  Component  Value Date   HGBA1C 6.6 (A) 03/22/2022   HGBA1C 6.7 (A) 09/19/2021   HGBA1C 7.2 (A) 03/28/2021    Latest Reference Range & Units 03/04/22 10:23  Sodium 135 - 146 mmol/L 140  Potassium 3.5 - 5.3 mmol/L 4.7  Chloride 98 - 110 mmol/L 103  CO2 20 - 32 mmol/L 28  Glucose 65 - 99 mg/dL 116 (H)  BUN 7 - 25 mg/dL 22  Creatinine 0.70 - 1.22 mg/dL 1.48 (H)  Calcium 8.6 - 10.3 mg/dL 10.1  BUN/Creatinine Ratio 6 - 22 (calc) 15  AG Ratio 1.0 - 2.5 (calc) 1.5  AST 10 - 35 U/L 20  ALT 9 - 46 U/L 19  Total Protein 6.1 - 8.1 g/dL 8.4 (H)  Total Bilirubin 0.2 - 1.2 mg/dL 0.5     ASSESSMENT / PLAN / RECOMMENDATIONS:   1) Type 2 Diabetes Mellitus, OPtimally controlled, With CKD III and neuropathic complications - Most recent A1c of 6.6  %. Goal A1c < 7.5 %.    - A1c at goal  - Pt with language and auditory barriers despite having his wife and interpreter line  - His GFR continues to be above 35 , so he may continue current dose of metformin     MEDICATIONS: Continue Metformin 500 mg , BID  EDUCATION / INSTRUCTIONS: BG monitoring instructions: Patient is instructed to check his blood sugars 1 times a day, fasting . Call [facility] if: BG persistently < 70  I reviewed the Rule of 15 for the treatment of hypoglycemia in detail with the patient. Literature supplied.     F/U in 6 months     Signed electronically by: Mack Guise, MD  [facility]  [facility] [address] [place], [place] [address] Phone: [phone] FAX: [phone]   CC: Colon Branch, [address] [place] [address] Phone: [phone]  Fax: [phone]  Return to Endocrinology clinic as below: Future Appointments  Date Time Provider [place]  07/15/2022  8:40 AM Colon Branch, MD [facility]  09/02/2022  9:15 AM Carlyle Basques, MD [facility]  09/23/2022  9:10 AM Shunna Mikaelian, Melanie Crazier, MD [facility] None

## 2022-03-29 ENCOUNTER — Other Ambulatory Visit (HOSPITAL_COMMUNITY): Payer: Self-pay

## 2022-04-01 ENCOUNTER — Other Ambulatory Visit (HOSPITAL_COMMUNITY): Payer: Self-pay

## 2022-04-01 ENCOUNTER — Telehealth: Payer: Self-pay

## 2022-04-01 ENCOUNTER — Other Ambulatory Visit: Payer: Self-pay

## 2022-04-01 NOTE — Telephone Encounter (Signed)
[facility] Patient Advocate Encounter   I was successful in securing patient a $ 10,000.00 grant from [employer] to provide copayment coverage for Viread.  The patient's out of pocket cost will be $0.00 monthly.     I have spoken with the patient.    The billing information is as follows and has been shared with [facility].           Dates of Eligibility: 04/01/22 through 02/25/23  Patient knows to call the office with questions or concerns.  Ileene Patrick, [facility] Patient [facility] Phone: [phone] Fax:  [phone]

## 2022-04-02 ENCOUNTER — Ambulatory Visit (INDEPENDENT_AMBULATORY_CARE_PROVIDER_SITE_OTHER): Payer: Medicare Other | Admitting: Internal Medicine

## 2022-04-02 ENCOUNTER — Encounter: Payer: Self-pay | Admitting: Internal Medicine

## 2022-04-02 VITALS — BP 126/74 | HR 76 | Temp 97.9°F | Resp 16 | Ht 62.6 in | Wt 138.4 lb

## 2022-04-02 DIAGNOSIS — E785 Hyperlipidemia, unspecified: Secondary | ICD-10-CM

## 2022-04-02 DIAGNOSIS — Z91148 Patient's other noncompliance with medication regimen for other reason: Secondary | ICD-10-CM

## 2022-04-02 DIAGNOSIS — L299 Pruritus, unspecified: Secondary | ICD-10-CM | POA: Diagnosis not present

## 2022-04-02 MED ORDER — ROSUVASTATIN CALCIUM 10 MG PO TABS: 10.0000 mg | ORAL_TABLET | Freq: Every day | ORAL | 0 refills | Status: DC

## 2022-04-02 NOTE — Progress Notes (Unsigned)
Subjective:    Patient ID: Kevin Hutchinson, male    DOB: 08-15-1934, 88 y.o.   MRN: [id number]  DOS:  04/02/2022 Type of visit - description: Routine follow-up, here with translator.  History of generalized itching, he states he has a rash, see physical exam. Denies any new supplements, new pets no new carpets at home. He was prescribed rosuvastatin back in December.   No fever chills.  Admits to sneezing and occasionally itchy eyes and itchy nose  Symptoms are not necessarily worse after he takes a shower.  Med compliance: Unclear to me if he is taking all medications as recommended   Review of Systems See above   Past Medical History:  Diagnosis Date   GERD (gastroesophageal reflux disease)    EGD 5-09: : Duodenitis without Hemorrhage, Esophageal Stricture.   Hepatitis B    Hyperlipidemia    PPD positive    Was Rx INH x 3 months (2008)--HD      Past Surgical History:  Procedure Laterality Date   NO PAST SURGERIES      Current Outpatient Medications  Medication Instructions   ezetimibe (ZETIA) 10 mg, Oral, Daily   fenofibrate 160 mg, Oral, Daily   metFORMIN (GLUCOPHAGE-XR) 500 mg, Oral, 2 times daily before meals   pantoprazole (PROTONIX) 40 mg, Oral, Daily before breakfast   rosuvastatin (CRESTOR) 10 mg, Oral, Daily at bedtime   tamsulosin (FLOMAX) 0.4 mg, Oral, Daily after supper   tenofovir (VIREAD) 300 mg, Oral, Every other day       Objective:   Physical Exam BP 126/74   Pulse 76   Temp 97.9 F (36.6 C) (Oral)   Resp 16   Ht 5' 2.6" (1.59 m)   Wt 138 lb 6 oz (62.8 kg)   SpO2 98%   BMI 24.83 kg/m  General:   Well developed, NAD, BMI noted.  HEENT:  Normocephalic . Face symmetric, atraumatic Lungs:  CTA B Normal respiratory effort, no intercostal retractions, no accessory muscle use. Heart: RRR,  no murmur.  Abdomen:  Not distended, soft, non-tender. No rebound or rigidity.  No organomegaly Skin: No rash, he showed me a couple places on his  extremities but they are excoriations from scratching. Lower extremities: no pretibial edema bilaterally  Neurologic:  alert & oriented X3.  Speech normal, gait appropriate for age and unassisted Psych--  Cognition and judgment appear intact.  Cooperative with normal attention span and concentration.  Behavior appropriate. No anxious or depressed appearing.     Assessment       Assessment DM Anemia GERD: EGD 06-2007: duodenitis, esophageal stricture BPH, slightly increased PSA, declined to see urology 2015, 05/2017.   Chronic hepatitis B H/o + PPD, INH 3 months 2008 HOH, s/p hearing assessment 11/30/2014: Severe hearing loss, has hearing aids   PLAN Pruritus: 2 months history of pruritus, generalized, no rash, fever or chills. I did prescribe rosuvastatin to him in December, this could be the culprit if he is allergic to it however he is not sure if he is taking it. Last LFTs and CBC normal. See next Compliance: I show him a medication list and he did not recognize the majority of names.  State he is taking the "liver pill and diabetes pill". I ask him to come back in a month and bring all his medications in the back.  Dyslipidemia: Last cholesterol elevated, was rx Crestor December 26,, as above, unclear if he is taking it or not.  Will send a prescription  to be sure.  LFTs 03/04/2022 essentially normal DM: Per Endo. RTC 1 month, strongly recommend to put all his medications in the back and bring them with him

## 2022-04-02 NOTE — Patient Instructions (Addendum)
For itching: Take Allegra 60 mg 1 tablet twice daily.  This is a over-the-counter medications you don't  need a prescription  Please come back for office visit in 1 month. Bring all the medications that you are taking with you.

## 2022-04-03 NOTE — Assessment & Plan Note (Signed)
Pruritus: 2 months history of pruritus, generalized, no rash, fever or chills. I did prescribe rosuvastatin to him in December, this could be the culprit if he is allergic to it however he is not sure if he is taking it. Last LFTs and CBC normal. See next Compliance: I show him a medication list and he did not recognize the majority of names.  State he is taking "the liver pill and diabetes pill". I ask him to come back in a month and bring all his medications in a bag. Dyslipidemia: Last cholesterol elevated, was rx Crestor December 26, as above, unclear if he is taking it or not.  Will send a prescription to be sure.  LFTs 03/04/2022 essentially normal DM: Per Endo. RTC 1 month, strongly recommend to put all his medications in  a bag

## 2022-04-30 ENCOUNTER — Encounter: Payer: Self-pay | Admitting: Internal Medicine

## 2022-04-30 ENCOUNTER — Other Ambulatory Visit (HOSPITAL_COMMUNITY): Payer: Self-pay

## 2022-04-30 ENCOUNTER — Ambulatory Visit (INDEPENDENT_AMBULATORY_CARE_PROVIDER_SITE_OTHER): Payer: Medicare Other | Admitting: Internal Medicine

## 2022-04-30 VITALS — BP 126/72 | HR 85 | Temp 97.9°F | Resp 16 | Ht 62.6 in | Wt 136.5 lb

## 2022-04-30 DIAGNOSIS — E785 Hyperlipidemia, unspecified: Secondary | ICD-10-CM

## 2022-04-30 DIAGNOSIS — L299 Pruritus, unspecified: Secondary | ICD-10-CM

## 2022-04-30 MED ORDER — TAMSULOSIN HCL 0.4 MG PO CAPS: 0.4000 mg | ORAL_CAPSULE | Freq: Every day | ORAL | 1 refills | Status: DC

## 2022-04-30 NOTE — Assessment & Plan Note (Signed)
Compliance: He brought his medication bottles, he is only missing Flomax and Allegra. Refills for Flomax sent, encourage Allegra 60 twice daily. Pruritus: See LOV, continue with generalized pruritus.  Could be related to dry skin versus rosuvastatin which was started December 2023. Plan: Aveeno lotion daily.  Stop rosuvastatin. RTC 3 months

## 2022-04-30 NOTE — Patient Instructions (Addendum)
Every day, use over-the-counter lotion called Aveeno on your abdomen, arms and legs.  Stop taking rosuvastatin  Be sure you take Allegra 60 mg over-the-counter twice a day  We refill a medication called tamsulosin to be taken 1 at night    Kevin Hutchinson, [place] back for a checkup in 3 months   Per our records you are due for your diabetic eye exam. Please contact your eye doctor to schedule an appointment. Please have them send copies of your office visit notes to [place]. Our fax number is [phone]. If you need a referral to an eye doctor please let us know.

## 2022-04-30 NOTE — Progress Notes (Signed)
   Subjective:    Patient ID: Kevin Hutchinson, male    DOB: 07-03-1934, 88 y.o.   MRN: [id number]  DOS:  04/30/2022 Type of visit - description: f/u here with his wife, also a Nurse, learning disability  The patient brought all his medicine  bottles with him and they were reviewed. His only concern is generalized ongoing itching.   No fever or chills Admits to some sneezing and mild cough.  Review of Systems See above   Past Medical History:  Diagnosis Date   GERD (gastroesophageal reflux disease)    EGD 5-09: : Duodenitis without Hemorrhage, Esophageal Stricture.   Hepatitis B    Hyperlipidemia    PPD positive    Was Rx INH x 3 months (2008)--HD      Past Surgical History:  Procedure Laterality Date   NO PAST SURGERIES      Current Outpatient Medications  Medication Instructions   ezetimibe (ZETIA) 10 mg, Oral, Daily   fenofibrate 160 mg, Oral, Daily   fexofenadine (ALLEGRA) 60 mg, 2 times daily   metFORMIN (GLUCOPHAGE-XR) 500 mg, Oral, 2 times daily before meals   pantoprazole (PROTONIX) 40 mg, Oral, Daily before breakfast   rosuvastatin (CRESTOR) 10 mg, Oral, Daily at bedtime   tamsulosin (FLOMAX) 0.4 mg, Oral, Daily after supper   tenofovir (VIREAD) 300 mg, Oral, Every other day       Objective:   Physical Exam BP 126/72   Pulse 85   Temp 97.9 F (36.6 C) (Oral)   Resp 16   Ht 5' 2.6" (1.59 m)   Wt 136 lb 8 oz (61.9 kg)   SpO2 97%   BMI 24.49 kg/m  General:   Well developed, NAD, BMI noted. HEENT:  Normocephalic . Face symmetric, atraumatic.  ++/+++ HOH Lungs:  CTA B Normal respiratory effort, no intercostal retractions, no accessory muscle use. Heart: RRR,  no murmur.  Lower extremities: no pretibial edema bilaterally  Skin: No specific rash, no exanthema, in general the skin is dry with some evidence of excoriation. More noticeable on the extremities than the abdomen.  Minimal on the back.  neurologic:  alert & oriented X3.  Speech normal, gait appropriate  for age and unassisted Psych--  Cognition and judgment appear intact.  Cooperative with normal attention span and concentration.  Behavior appropriate. No anxious or depressed appearing.      Assessment    Assessment DM Anemia GERD: EGD 06-2007: duodenitis, esophageal stricture BPH, slightly increased PSA, declined to see urology 2015, 05/2017.   Chronic hepatitis B H/o + PPD, INH 3 months 2008 HOH, s/p hearing assessment 11/30/2014: Severe hearing loss, has hearing aids   PLAN Compliance: He brought his medication bottles, he is only missing Flomax and Allegra. Refills for Flomax sent, encourage Allegra 60 twice daily. Pruritus: See LOV, continue with generalized pruritus.  Could be related to dry skin versus rosuvastatin which was started December 2023. Plan: Aveeno lotion daily.  Stop rosuvastatin. RTC 3 months

## 2022-05-01 ENCOUNTER — Ambulatory Visit: Payer: Medicare Other | Admitting: Internal Medicine

## 2022-05-14 ENCOUNTER — Other Ambulatory Visit (HOSPITAL_COMMUNITY): Payer: Self-pay

## 2022-05-22 ENCOUNTER — Other Ambulatory Visit: Payer: Self-pay

## 2022-05-22 ENCOUNTER — Other Ambulatory Visit (HOSPITAL_COMMUNITY): Payer: Self-pay

## 2022-06-04 ENCOUNTER — Encounter: Payer: Self-pay | Admitting: Internal Medicine

## 2022-06-09 ENCOUNTER — Other Ambulatory Visit: Payer: Self-pay | Admitting: Internal Medicine

## 2022-06-10 ENCOUNTER — Other Ambulatory Visit: Payer: Self-pay | Admitting: Internal Medicine

## 2022-07-15 ENCOUNTER — Ambulatory Visit: Payer: Medicare Other | Admitting: Internal Medicine

## 2022-07-16 ENCOUNTER — Other Ambulatory Visit (HOSPITAL_COMMUNITY): Payer: Self-pay

## 2022-07-17 ENCOUNTER — Other Ambulatory Visit (HOSPITAL_COMMUNITY): Payer: Self-pay

## 2022-08-08 ENCOUNTER — Other Ambulatory Visit (HOSPITAL_COMMUNITY): Payer: Self-pay

## 2022-08-13 ENCOUNTER — Ambulatory Visit (INDEPENDENT_AMBULATORY_CARE_PROVIDER_SITE_OTHER): Payer: Medicare Other | Admitting: Internal Medicine

## 2022-08-13 ENCOUNTER — Other Ambulatory Visit: Payer: Medicare Other

## 2022-08-13 ENCOUNTER — Encounter: Payer: Medicare Other | Admitting: *Deleted

## 2022-08-13 ENCOUNTER — Encounter: Payer: Self-pay | Admitting: Internal Medicine

## 2022-08-13 VITALS — BP 126/74 | HR 86 | Temp 97.7°F | Resp 16 | Ht 62.6 in | Wt 137.4 lb

## 2022-08-13 DIAGNOSIS — Z7984 Long term (current) use of oral hypoglycemic drugs: Secondary | ICD-10-CM

## 2022-08-13 DIAGNOSIS — H9193 Unspecified hearing loss, bilateral: Secondary | ICD-10-CM | POA: Diagnosis not present

## 2022-08-13 DIAGNOSIS — E1169 Type 2 diabetes mellitus with other specified complication: Secondary | ICD-10-CM

## 2022-08-13 DIAGNOSIS — E785 Hyperlipidemia, unspecified: Secondary | ICD-10-CM | POA: Diagnosis not present

## 2022-08-13 DIAGNOSIS — Z91199 Patient's noncompliance with other medical treatment and regimen due to unspecified reason: Secondary | ICD-10-CM | POA: Diagnosis not present

## 2022-08-13 DIAGNOSIS — N400 Enlarged prostate without lower urinary tract symptoms: Secondary | ICD-10-CM

## 2022-08-13 LAB — BASIC METABOLIC PANEL
BUN: 17 mg/dL (ref 6–23)
CO2: 28 mEq/L (ref 19–32)
Calcium: 8.9 mg/dL (ref 8.4–10.5)
Chloride: 101 mEq/L (ref 96–112)
Creatinine, Ser: 1.24 mg/dL (ref 0.40–1.50)
GFR: 52.83 mL/min — ABNORMAL LOW (ref >60.00)
Glucose, Bld: 112 mg/dL — ABNORMAL HIGH (ref 70–99)
Potassium: 4.5 mEq/L (ref 3.5–5.1)
Sodium: 137 mEq/L (ref 135–145)

## 2022-08-13 LAB — MICROALBUMIN / CREATININE URINE RATIO
Creatinine,U: 92 mg/dL
Microalb Creat Ratio: 1 mg/g (ref 0.0–30.0)
Microalb, Ur: 0.9 mg/dL (ref 0.0–1.9)

## 2022-08-13 NOTE — Progress Notes (Signed)
Pt did not show up for appointment. This encounter was created in error - please disregard.

## 2022-08-13 NOTE — Progress Notes (Unsigned)
   Subjective:    Patient ID: Kevin Hutchinson, male    DOB: [birth year], 88 y.o.   MRN: [id number]  DOS:  08/13/2022 Type of visit - description: Routine checkup, here with translator  In general feeling well. Did report cough for 3 days, dry. No associated fever or chills or difficulty breathing. W/  cough, he hurts at the anterior lower chest bilaterally. No other family members affected. No sinus pain or congestion  Review of Systems See above   Past Medical History:  Diagnosis Date   GERD (gastroesophageal reflux disease)    EGD 5-09: : Duodenitis without Hemorrhage, Esophageal Stricture.   Hepatitis B    Hyperlipidemia    PPD positive    Was Rx INH x 3 months (2008)--HD      Past Surgical History:  Procedure Laterality Date   NO PAST SURGERIES      Current Outpatient Medications  Medication Instructions   ezetimibe (ZETIA) 10 mg, Oral, Daily   fenofibrate 160 mg, Oral, Daily   fexofenadine (ALLEGRA) 60 mg, 2 times daily   metFORMIN (GLUCOPHAGE-XR) 500 mg, Oral, 2 times daily before meals   pantoprazole (PROTONIX) 40 mg, Oral, Daily before breakfast   rosuvastatin (CRESTOR) 10 mg, Oral, Daily at bedtime   tamsulosin (FLOMAX) 0.4 mg, Oral, Daily after supper   tenofovir (VIREAD) 300 mg, Oral, Every other day       Objective:   Physical Exam BP 126/74   Pulse 86   Temp 97.7 F (36.5 C) (Oral)   Resp 16   Ht 5' 2.6" (1.59 m)   Wt 137 lb 6 oz (62.3 kg)   SpO2 96%   BMI 24.65 kg/m  General:   Well developed, NAD, BMI noted. HEENT:  Normocephalic . Face symmetric, atraumatic Lungs:  CTA B Normal respiratory effort, no intercostal retractions, no accessory muscle use. Heart: RRR,  no murmur.  Lower extremities: no pretibial edema bilaterally  Skin: Not pale. Not jaundice Neurologic:  alert & oriented X3.  Speech normal, gait appropriate for age and unassisted Psych--  Cognition and judgment appear intact.  Cooperative with normal attention span and  concentration.  Behavior appropriate. No anxious or depressed appearing.      Assessment     Assessment DM Anemia Dyslipidemia: See OV 08/14/2022, rosuvastatin d/c d/t pruritus GERD: EGD 06-2007: duodenitis, esophageal stricture BPH, slightly increased PSA, declined to see urology 2015, 05/2017.   Chronic hepatitis B H/o + PPD, INH 3 months 2008 HOH, s/p hearing assessment 11/30/2014: Severe hearing loss, has hearing aids   PLAN Compliance: Not taking pantoprazole, Flomax,  Zetia, fenofibrate or Allegra.  He also is to take Crestor at my request.  See next Pruritus: Symptoms stopped after I asked him to discontinue rosuvastatin.  There was probably a relationship.  Avoid statins for now.  DM: On metformin, check a A1c, BMP. GERD: Self stop PPIs, reports no major symptoms.  Restart PPIs if needed Dyslipidemia: Pruritus with rosuvastatin?  Not taking Zetia, compliance is an issue.  Currently on no meds BPH: Self d/c Flomax, doing okay. Cough: For 3 days, no fever or chills, exam benign, URI?Marland Kitchen  Recommend Robitussin DM and observation HOH: Significant HOH, making communication very difficult.  Referred to an audiologist RTC 4 months

## 2022-08-13 NOTE — Patient Instructions (Addendum)
For cough: Drink plenty of fluids, start Robitussin DM over-the-counter, come back if fever, chills or you are not better in the next few days.  We are referring you to a hearing aid specialist about your difficulty hearing.  Vaccines I recommend: Shingrix (shingles) Tdap (tetanus) Covid booster RSV vaccine  Per our records you are due for your diabetic eye exam. Please contact your eye doctor to schedule an appointment. Please have them send copies of your office visit notes to [place]. Our fax number is [phone]. If you need a referral to an eye doctor please let us know.     GO TO THE LAB : Get the blood work     GO TO THE FRONT DESK, PLEASE SCHEDULE YOUR APPOINTMENTS Come back for a checkup in 4 months

## 2022-08-14 LAB — HEMOGLOBIN A1C
Hgb A1c MFr Bld: 7.1 % of total Hgb — ABNORMAL HIGH (ref <5.7)
Mean Plasma Glucose: 157 mg/dL
eAG (mmol/L): 8.7 mmol/L

## 2022-08-14 NOTE — Assessment & Plan Note (Signed)
Compliance: Not taking pantoprazole, Flomax,  Zetia, fenofibrate or Allegra.  He also is to take Crestor at my request.  See next Pruritus: Symptoms stopped after I asked him to discontinue rosuvastatin.  There was probably a relationship.  Avoid statins for now.  DM: On metformin, check a A1c, BMP. GERD: Self stop PPIs, reports no major symptoms.  Restart PPIs if needed Dyslipidemia: Pruritus with rosuvastatin?  Not taking Zetia, compliance is an issue.  Currently on no meds BPH: Self d/c Flomax, doing okay. Cough: For 3 days, no fever or chills, exam benign, URI?Marland Kitchen  Recommend Robitussin DM and observation HOH: Significant HOH, making communication very difficult.  Referred to an audiologist RTC 4 months

## 2022-08-15 NOTE — Progress Notes (Deleted)
   Subjective:    Patient ID: TRENNON SAUCERMAN, male    DOB: 1934-04-10, 88 y.o.   MRN: [id number]  DOS:  08/13/2022 Type of visit - description:     Review of Systems See above   Past Medical History:  Diagnosis Date   GERD (gastroesophageal reflux disease)    EGD 5-09: : Duodenitis without Hemorrhage, Esophageal Stricture.   Hepatitis B    Hyperlipidemia    PPD positive    Was Rx INH x 3 months (2008)--HD      Past Surgical History:  Procedure Laterality Date   NO PAST SURGERIES      Current Outpatient Medications  Medication Instructions   metFORMIN (GLUCOPHAGE-XR) 500 mg, Oral, 2 times daily before meals   tenofovir (VIREAD) 300 mg, Oral, Every other day       Objective:   Physical Exam There were no vitals taken for this visit.     Assessment

## 2022-09-02 ENCOUNTER — Other Ambulatory Visit: Payer: Self-pay

## 2022-09-02 ENCOUNTER — Other Ambulatory Visit (HOSPITAL_COMMUNITY): Payer: Self-pay

## 2022-09-02 ENCOUNTER — Encounter: Payer: Self-pay | Admitting: Internal Medicine

## 2022-09-02 ENCOUNTER — Ambulatory Visit: Payer: Medicare Other | Admitting: Internal Medicine

## 2022-09-02 VITALS — BP 122/74 | HR 80 | Resp 16 | Ht 62.6 in | Wt 138.0 lb

## 2022-09-02 DIAGNOSIS — B181 Chronic viral hepatitis B without delta-agent: Secondary | ICD-10-CM

## 2022-09-02 MED ORDER — TENOFOVIR DISOPROXIL FUMARATE 300 MG PO TABS
300.0000 mg | ORAL_TABLET | Freq: Every day | ORAL | 11 refills | Status: DC
  Filled 2022-09-02 – 2022-09-06 (×2): qty 30, 30d supply, fill #0
  Filled 2022-10-01: qty 30, 30d supply, fill #1
  Filled 2022-11-01: qty 30, 30d supply, fill #2
  Filled 2022-12-02: qty 30, 30d supply, fill #3
  Filled 2022-12-18: qty 30, 30d supply, fill #4
  Filled 2023-01-22: qty 30, 30d supply, fill #5
  Filled 2023-02-17: qty 30, 30d supply, fill #6
  Filled 2023-03-13: qty 30, 30d supply, fill #7
  Filled 2023-04-18: qty 30, 30d supply, fill #8
  Filled 2023-05-21: qty 30, 30d supply, fill #9
  Filled 2023-06-16: qty 30, 30d supply, fill #10

## 2022-09-02 MED ORDER — TENOFOVIR DISOPROXIL FUMARATE 300 MG PO TABS
300.0000 mg | ORAL_TABLET | ORAL | 6 refills | Status: DC
  Filled 2022-09-02: qty 30, 60d supply, fill #0

## 2022-09-02 NOTE — Progress Notes (Signed)
RFV: follow up for chronic hepatitis b  Patient ID: Kevin Hutchinson, male   DOB: February 03, 1935, 88 y.o.   MRN: [id number]  HPI [name] is an 88yo M with chronic hep B who has been treated with tenofovir ( insurance did not cover TAF) has been taking every other day. He Previously had pruritus diffuse now just one residual spot on left leg  He denies any health issues. Continues to follow up with his PCP, Dr Drue Novel. Today, interpreter helping with assessment.  On our last appt: Jan 2024: VL undetectable and RUQ U/S did not show any lesions.  Outpatient Encounter Medications as of 09/02/2022  Medication Sig   metFORMIN (GLUCOPHAGE-XR) 500 MG 24 hr tablet Take 1 tablet (500 mg total) by mouth 2 (two) times daily before a meal.   tenofovir (VIREAD) 300 MG tablet Take 1 tablet (300 mg total) by mouth every other day.   No facility-administered encounter medications on file as of 09/02/2022.     Patient Active Problem List   Diagnosis Date Noted   Cirrhosis of liver without ascites, unspecified hepatic cirrhosis type (HCC) 08/27/2021   Diabetes (HCC) 12/11/2017   Seasonal allergies 06/05/2017   Elevated PSA 06/05/2017   PCP NOTES >>>>>>>>>>>>>>. 03/21/2016   Hearing loss 03/16/2015   Pain in joint, shoulder region 06/28/2013   Annual physical exam 01/01/2011   BPH (benign prostatic hyperplasia) 01/01/2011   DIZZINESS 05/16/2009   Dyslipidemia 06/15/2007   Hepatitis B, chronic (HCC) 06/15/2007   GERD 03/12/2007   PPD positive 03/12/2007   BACK PAIN 02/16/2007     Health Maintenance Due  Topic Date Due   Zoster Vaccines- Shingrix (1 of 2) Never done   Medicare Annual Wellness (AWV)  06/29/2014   DTaP/Tdap/Td (2 - Tdap) 03/14/2019   OPHTHALMOLOGY EXAM  05/29/2021   COVID-19 Vaccine (6 - 2023-24 season) 10/26/2021     Review of Systems Review of Systems  Constitutional: Negative for fever, chills, diaphoresis, activity change, appetite change, fatigue and unexpected weight change.   HENT: Negative for congestion, sore throat, rhinorrhea, sneezing, trouble swallowing and sinus pressure.  Eyes: Negative for photophobia and visual disturbance.  Respiratory: Negative for cough, chest tightness, shortness of breath, wheezing and stridor.  Cardiovascular: Negative for chest pain, palpitations and leg swelling.  Gastrointestinal: Negative for nausea, vomiting, abdominal pain, diarrhea, constipation, blood in stool, abdominal distention and anal bleeding.  Genitourinary: Negative for dysuria, hematuria, flank pain and difficulty urinating.  Musculoskeletal: Negative for myalgias, back pain, joint swelling, arthralgias and gait problem.  Skin: Negative for color change, pallor, rash and wound.  Neurological: Negative for dizziness, tremors, weakness and light-headedness.  Hematological: Negative for adenopathy. Does not bruise/bleed easily.  Psychiatric/Behavioral: Negative for behavioral problems, confusion, sleep disturbance, dysphoric mood, decreased concentration and agitation.   Physical Exam   BP 122/74   Pulse 80   Resp 16   Ht 5' 2.6" (1.59 m)   Wt 138 lb (62.6 kg)   BMI 24.76 kg/m    Physical Exam  Constitutional: He is oriented to person, place, and time. He appears well-developed and well-nourished. No distress.  HENT:  Mouth/Throat: Oropharynx is clear and moist. No oropharyngeal exudate.  Cardiovascular: Normal rate, regular rhythm and normal heart sounds. Exam reveals no gallop and no friction rub.  No murmur heard.  Pulmonary/Chest: Effort normal and breath sounds normal. No respiratory distress. He has no wheezes.  Abdominal: Soft. Bowel sounds are normal. He exhibits no distension. There is no tenderness.  Lymphadenopathy:  He has no cervical adenopathy.  Neurological: He is alert and oriented to person, place, and time.  Skin: Skin is warm and dry. No rash noted. No erythema.  Psychiatric: He has a normal mood and affect. His behavior is normal.    Lab Results  Component Value Date   HEPBSAB NON-REACTIVE 08/27/2021   No results found for: "RPR", "LABRPR"  CBC Lab Results  Component Value Date   WBC 9.9 02/19/2022   RBC 5.77 02/19/2022   HGB 14.6 02/19/2022   HCT 45.7 02/19/2022   PLT 148.0 (L) 02/19/2022   MCV 79.2 02/19/2022   MCH 25.1 (L) 07/17/2020   MCHC 32.0 02/19/2022   RDW 14.8 02/19/2022   LYMPHSABS 4.5 (H) 02/19/2022   MONOABS 0.7 02/19/2022   EOSABS 0.5 02/19/2022    BMET Lab Results  Component Value Date   NA 137 08/13/2022   K 4.5 08/13/2022   CL 101 08/13/2022   CO2 28 08/13/2022   GLUCOSE 112 (H) 08/13/2022   BUN 17 08/13/2022   CREATININE 1.24 08/13/2022   CALCIUM 8.9 08/13/2022   GFRNONAA 67 11/11/2018   GFRAA 78 11/11/2018      Assessment and Plan Chronic hepatitis B without hepatic coma = Will check Labs for hep b Will give Refills on tenofovir but change to daily dosing Reinforce schedule of how to take medication, along with translator  Health maintenance = Give recs on vaccines in the fall  I have personally spent 23 minutes involved in face-to-face and non-face-to-face activities for this patient on the day of the visit. Professional time spent includes the following activities: Preparing to see the patient (review of tests), Obtaining and/or reviewing separately obtained history (admission/discharge record), Performing a medically appropriate examination and/or evaluation , Ordering medications/tests/procedures, referring and communicating with other health care professionals, Documenting clinical information in the EMR, Independently interpreting results (not separately reported), Communicating results to the patient/family/caregiver, Counseling and educating the patient/family/caregiver and Care coordination (not separately reported).

## 2022-09-03 LAB — COMPREHENSIVE METABOLIC PANEL
AG Ratio: 1.5 (calc) (ref 1.0–2.5)
ALT: 26 U/L (ref 9–46)
AST: 24 U/L (ref 10–35)
Alkaline phosphatase (APISO): 64 U/L (ref 35–144)
Calcium: 9.7 mg/dL (ref 8.6–10.3)
Creat: 1.18 mg/dL (ref 0.70–1.22)
Globulin: 3 g/dL (calc) (ref 1.9–3.7)
Potassium: 5.6 mmol/L — ABNORMAL HIGH (ref 3.5–5.3)
Total Protein: 7.5 g/dL (ref 6.1–8.1)

## 2022-09-04 ENCOUNTER — Other Ambulatory Visit (HOSPITAL_COMMUNITY): Payer: Self-pay

## 2022-09-05 LAB — HEPATITIS B DNA, ULTRAQUANTITATIVE, PCR
Hepatitis B DNA: <10 IU/mL — ABNORMAL HIGH
Hepatitis B virus DNA: <1 Log IU/mL — ABNORMAL HIGH

## 2022-09-05 LAB — AFP TUMOR MARKER: AFP-Tumor Marker: 3.2 ng/mL (ref <6.1)

## 2022-09-05 LAB — COMPREHENSIVE METABOLIC PANEL
Albumin: 4.5 g/dL (ref 3.6–5.1)
BUN: 19 mg/dL (ref 7–25)
CO2: 30 mmol/L (ref 20–32)
Chloride: 103 mmol/L (ref 98–110)
Glucose, Bld: 151 mg/dL — ABNORMAL HIGH (ref 65–99)
Sodium: 139 mmol/L (ref 135–146)
Total Bilirubin: 0.4 mg/dL (ref 0.2–1.2)

## 2022-09-06 ENCOUNTER — Other Ambulatory Visit (HOSPITAL_COMMUNITY): Payer: Self-pay

## 2022-09-06 ENCOUNTER — Other Ambulatory Visit: Payer: Self-pay

## 2022-09-09 ENCOUNTER — Other Ambulatory Visit (HOSPITAL_COMMUNITY): Payer: Self-pay

## 2022-09-17 ENCOUNTER — Other Ambulatory Visit: Payer: Medicare Other

## 2022-09-23 ENCOUNTER — Ambulatory Visit: Payer: Medicare Other | Admitting: Internal Medicine

## 2022-10-01 ENCOUNTER — Other Ambulatory Visit (HOSPITAL_COMMUNITY): Payer: Self-pay

## 2022-10-04 ENCOUNTER — Other Ambulatory Visit (HOSPITAL_COMMUNITY): Payer: Self-pay

## 2022-10-29 ENCOUNTER — Other Ambulatory Visit (HOSPITAL_COMMUNITY): Payer: Self-pay

## 2022-11-01 ENCOUNTER — Other Ambulatory Visit (HOSPITAL_COMMUNITY): Payer: Self-pay

## 2022-11-12 ENCOUNTER — Ambulatory Visit
Admission: RE | Admit: 2022-11-12 | Discharge: 2022-11-12 | Disposition: A | Discharge status: Home / Self Care | Payer: Medicare Other | Source: Ambulatory Visit | Attending: Internal Medicine | Admitting: Internal Medicine

## 2022-11-12 DIAGNOSIS — B181 Chronic viral hepatitis B without delta-agent: Secondary | ICD-10-CM

## 2022-11-19 ENCOUNTER — Other Ambulatory Visit (HOSPITAL_COMMUNITY): Payer: Self-pay

## 2022-11-27 ENCOUNTER — Other Ambulatory Visit (HOSPITAL_COMMUNITY): Payer: Self-pay

## 2022-11-29 ENCOUNTER — Other Ambulatory Visit (HOSPITAL_COMMUNITY): Payer: Self-pay

## 2022-12-02 ENCOUNTER — Other Ambulatory Visit: Payer: Self-pay

## 2022-12-02 NOTE — Progress Notes (Signed)
Specialty Pharmacy Refill Coordination Note  Kevin Hutchinson is a 88 y.o. male contacted today regarding refills of specialty medication(s) Tenofovir Disoproxil Fumarate   Patient requested Delivery   Delivery date: 12/03/22   Verified address: [address]   [place] [address]   Medication will be filled on 12/02/22.

## 2022-12-17 ENCOUNTER — Ambulatory Visit (INDEPENDENT_AMBULATORY_CARE_PROVIDER_SITE_OTHER): Payer: Medicare Other | Admitting: Internal Medicine

## 2022-12-17 ENCOUNTER — Encounter: Payer: Self-pay | Admitting: Internal Medicine

## 2022-12-17 VITALS — BP 116/68 | HR 71 | Temp 98.0°F | Resp 16 | Ht 62.6 in | Wt 139.0 lb

## 2022-12-17 DIAGNOSIS — K921 Melena: Secondary | ICD-10-CM | POA: Diagnosis not present

## 2022-12-17 DIAGNOSIS — Z7984 Long term (current) use of oral hypoglycemic drugs: Secondary | ICD-10-CM | POA: Diagnosis not present

## 2022-12-17 DIAGNOSIS — K625 Hemorrhage of anus and rectum: Secondary | ICD-10-CM | POA: Diagnosis not present

## 2022-12-17 DIAGNOSIS — E1169 Type 2 diabetes mellitus with other specified complication: Secondary | ICD-10-CM

## 2022-12-17 LAB — CBC WITH DIFFERENTIAL/PLATELET
Basophils Absolute: 0.1 10*3/uL (ref 0.0–0.1)
Basophils Relative: 0.7 % (ref 0.0–3.0)
Eosinophils Absolute: 1.2 10*3/uL — ABNORMAL HIGH (ref 0.0–0.7)
Eosinophils Relative: 11.9 % — ABNORMAL HIGH (ref 0.0–5.0)
HCT: 46.8 % (ref 39.0–52.0)
Hemoglobin: 14.7 g/dL (ref 13.0–17.0)
Lymphocytes Relative: 43.1 % (ref 12.0–46.0)
Lymphs Abs: 4.4 10*3/uL — ABNORMAL HIGH (ref 0.7–4.0)
MCHC: 31.5 g/dL (ref 30.0–36.0)
MCV: 79.5 fL (ref 78.0–100.0)
Monocytes Absolute: 0.7 10*3/uL (ref 0.1–1.0)
Monocytes Relative: 6.8 % (ref 3.0–12.0)
Neutro Abs: 3.8 10*3/uL (ref 1.4–7.7)
Neutrophils Relative %: 37.5 % — ABNORMAL LOW (ref 43.0–77.0)
Platelets: 136 10*3/uL — ABNORMAL LOW (ref 150.0–400.0)
RBC: 5.89 Mil/uL — ABNORMAL HIGH (ref 4.22–5.81)
RDW: 14.8 % (ref 11.5–15.5)
WBC: 10.2 10*3/uL (ref 4.0–10.5)

## 2022-12-17 LAB — COMPREHENSIVE METABOLIC PANEL
ALT: 22 U/L (ref 0–53)
AST: 22 U/L (ref 0–37)
Albumin: 4.4 g/dL (ref 3.5–5.2)
Alkaline Phosphatase: 60 U/L (ref 39–117)
BUN: 19 mg/dL (ref 6–23)
CO2: 30 meq/L (ref 19–32)
Calcium: 9.5 mg/dL (ref 8.4–10.5)
Chloride: 101 meq/L (ref 96–112)
Creatinine, Ser: 1.3 mg/dL (ref 0.40–1.50)
GFR: 49.8 mL/min — ABNORMAL LOW (ref >60.00)
Glucose, Bld: 147 mg/dL — ABNORMAL HIGH (ref 70–99)
Potassium: 4.9 meq/L (ref 3.5–5.1)
Sodium: 139 meq/L (ref 135–145)
Total Bilirubin: 0.6 mg/dL (ref 0.2–1.2)
Total Protein: 7.3 g/dL (ref 6.0–8.3)

## 2022-12-17 LAB — HEMOCCULT GUIAC POC 1CARD (OFFICE): Fecal Occult Blood, POC: NEGATIVE

## 2022-12-17 LAB — IRON: Iron: 182 ug/dL — ABNORMAL HIGH (ref 42–165)

## 2022-12-17 LAB — FERRITIN: Ferritin: 59.4 ng/mL (ref 22.0–322.0)

## 2022-12-17 MED ORDER — PANTOPRAZOLE SODIUM 40 MG PO TBEC: 40.0000 mg | DELAYED_RELEASE_TABLET | Freq: Every day | ORAL | 3 refills | Status: DC

## 2022-12-17 NOTE — Progress Notes (Signed)
Subjective:    Patient ID: Kevin Hutchinson, male    DOB: Oct 11, 1934, 88 y.o.   MRN: [id number]  DOS:  12/17/2022 Type of visit - description: f/u, here with the interpreter  Routine checkup. The patient reports a 69-month history of burning at the chest along with occasional dysphagia. Sometimes he needs to drink water after solid foods  to make it go through. Also, BMs has been black in color consistently. Denies any fresh, red blood per rectum. No abdominal pain per se. Taking NSAIDs?  Unknown, he does take some OTCs but does not recall the name.  No fever or chills.  No weight loss. No nausea vomiting.  No diarrhea.  No blood in the urine.   Wt Readings from Last 3 Encounters:  12/17/22 139 lb (63 kg)  09/02/22 138 lb (62.6 kg)  08/13/22 137 lb 6 oz (62.3 kg)     Review of Systems See above   Past Medical History:  Diagnosis Date   GERD (gastroesophageal reflux disease)    EGD 5-09: : Duodenitis without Hemorrhage, Esophageal Stricture.   Hepatitis B    Hyperlipidemia    PPD positive    Was Rx INH x 3 months (2008)--HD      Past Surgical History:  Procedure Laterality Date   NO PAST SURGERIES      Current Outpatient Medications  Medication Instructions   metFORMIN (GLUCOPHAGE-XR) 500 mg, Oral, 2 times daily before meals   tenofovir (VIREAD) 300 mg, Oral, Daily       Objective:   Physical Exam BP 116/68   Pulse 71   Temp 98 F (36.7 C) (Oral)   Resp 16   Ht 5' 2.6" (1.59 m)   Wt 139 lb (63 kg)   SpO2 98%   BMI 24.94 kg/m  General:   Well developed, NAD, BMI noted.  HEENT:  Normocephalic . Face symmetric, atraumatic.  Not pale Lungs:  CTA B Normal respiratory effort, no intercostal retractions, no accessory muscle use. Heart: RRR,  no murmur.  Abdomen:  Not distended, soft, non-tender. No rebound or rigidity. DRE: Prostate slightly enlarged but not tender or nodular. Scant amount of brown stools found, Hemoccult negative. Skin: Not pale. Not  jaundice Lower extremities: no pretibial edema bilaterally  Neurologic:  alert & oriented X3.  Speech normal, gait appropriate for age and unassisted Psych--  Cognition and judgment appear intact.  Cooperative with normal attention span and concentration.  Behavior appropriate. No anxious or depressed appearing.     Assessment    Assessment DM Anemia Dyslipidemia: See OV 08/14/2022, rosuvastatin d/c d/t pruritus GERD: EGD 06-2007: duodenitis, esophageal stricture BPH, slightly increased PSA, declined to see urology 2015, 05/2017.   Chronic hepatitis B H/o + PPD, INH 3 months 2008 HOH, s/p hearing assessment 11/30/2014: Severe hearing loss, has hearing aids   PLAN GI bleed: Upper GI bleed by history, does not look pale, not tachycardic, scant amount of stools found on DRE, brown in color, Hemoccult negative.  Unknown known if he is taking NSAIDs. Plan: Avoid NSAIDs, rx pantoprazole. CBC, iron ferritin, GI referral.  Stressed importance to see them.    DM: On metformin, checking labs. Vaccines I recommend: Tdap, Shingrix, flu shot, COVID booster, RSV, PNM 20 RTC 3 months.

## 2022-12-17 NOTE — Assessment & Plan Note (Signed)
GI bleed: Upper GI bleed by history, does not look pale, not tachycardic, scant amount of stools found on DRE, brown in color, Hemoccult negative.  Unknown known if he is taking NSAIDs. Plan: Avoid NSAIDs, rx  pantoprazole. CBC, iron ferritin, GI referral.  Stressed importance to see them.    DM: On metformin, checking labs. Vaccines I recommend: Tdap, Shingrix, flu shot, COVID booster, RSV, PNM 20 RTC 3 months.

## 2022-12-17 NOTE — Patient Instructions (Addendum)
I suspect you have a bleeding from the stomach.  Avoid taking any over-the-counter anti-inflammatories such as ibuprofen, naproxen.  Start taking a stomach medication called pantoprazole 40 mg: 1 tablet before breakfast.  We are referring you to a specialist, a gastroenterologist, they will ask you the same  questions and decide if you need endoscopy.  The bleeding can be from a very serious condition.  Do not miss that appointment.   Vaccines I recommend: Covid booster Shingrix (shingles) Tdap (tetanus) RSV vaccine Flu shot Monia shot (PNM 20)     GO TO THE LAB : Get the blood work     Next visit with me in 3 months for a routine checkup.    Please schedule it at the front desk      If you have MyChart, please check frequently in the next few days for your results      Per our records you are due for your diabetic eye exam. Please contact your eye doctor to schedule an appointment. Please have them send copies of your office visit notes to [place]. Our fax number is [phone]. If you need a referral to an eye doctor please let us know.

## 2022-12-18 ENCOUNTER — Other Ambulatory Visit: Payer: Medicare Other

## 2022-12-18 ENCOUNTER — Other Ambulatory Visit: Payer: Self-pay

## 2022-12-18 DIAGNOSIS — E1169 Type 2 diabetes mellitus with other specified complication: Secondary | ICD-10-CM | POA: Diagnosis not present

## 2022-12-18 NOTE — Progress Notes (Signed)
Specialty Pharmacy Refill Coordination Note  Jairon Karie Schwalbe Valdovinos is a 88 y.o. male contacted today regarding refills of specialty medication(s) Tenofovir Disoproxil Fumarate   Patient requested Delivery   Delivery date: 12/31/22   Verified address: [address]  [place] [address]   Medication will be filled on 12/30/22.

## 2022-12-18 NOTE — Progress Notes (Signed)
Specialty Pharmacy Ongoing Clinical Assessment Note  Kevin Hutchinson is a 88 y.o. male who is being followed by the specialty pharmacy service for RxSp Hepatitis B   Patient's specialty medication(s) reviewed today: Tenofovir Disoproxil Fumarate   Missed doses in the last 4 weeks: 0   Patient/Caregiver did not have any additional questions or concerns.   Therapeutic benefit summary: Patient is achieving benefit   Adverse events/side effects summary: No adverse events/side effects   Patient's therapy is appropriate to: Continue    Goals Addressed             This Visit's Progress    Maintain optimal adherence to therapy       Patient is on track. Patient will maintain adherence         Follow up:  6 months  Otto Herb Specialty Pharmacist

## 2022-12-19 LAB — HEMOGLOBIN A1C
Hgb A1c MFr Bld: 7.7 %{Hb} — ABNORMAL HIGH (ref <5.7)
Mean Plasma Glucose: 174 mg/dL
eAG (mmol/L): 9.7 mmol/L

## 2023-01-02 ENCOUNTER — Encounter: Payer: Self-pay | Admitting: Internal Medicine

## 2023-01-03 ENCOUNTER — Encounter: Payer: Self-pay | Admitting: Gastroenterology

## 2023-01-16 ENCOUNTER — Other Ambulatory Visit: Payer: Self-pay

## 2023-01-21 ENCOUNTER — Other Ambulatory Visit: Payer: Self-pay

## 2023-01-22 ENCOUNTER — Other Ambulatory Visit (HOSPITAL_COMMUNITY): Payer: Self-pay | Admitting: Pharmacy Technician

## 2023-01-22 ENCOUNTER — Other Ambulatory Visit (HOSPITAL_COMMUNITY): Payer: Self-pay

## 2023-01-22 NOTE — Progress Notes (Signed)
Specialty Pharmacy Refill Coordination Note  Kevin Hutchinson is a 88 y.o. male contacted today regarding refills of specialty medication(s) Tenofovir Disoproxil Fumarate   Patient requested Delivery   Delivery date: 01/29/23   Verified address: [address] [name]   Medication will be filled on 01/28/23.

## 2023-02-17 ENCOUNTER — Other Ambulatory Visit: Payer: Self-pay

## 2023-02-17 NOTE — Progress Notes (Signed)
Specialty Pharmacy Refill Coordination Note  Kevin Hutchinson is a 88 y.o. male contacted today regarding refills of specialty medication(s) Tenofovir Disoproxil Fumarate [name])   Patient requested Delivery   Delivery date: 02/25/23   Verified address: [address]   [place] [address]   Medication will be filled on 02/24/23.

## 2023-02-18 ENCOUNTER — Encounter: Payer: Self-pay | Admitting: Pharmacist

## 2023-02-18 NOTE — Progress Notes (Signed)
Pharmacy Quality Measure Review  This patient is appearing on a report for being at risk of failing the adherence measure for diabetes medications this calendar year.   Medication: metformin  Last fill date: 10/21/2022 for 90 day supply   Used interpreter phone services to contact patient. Discussed directions for metformin. Patient endorsed he is taking twice a day.  Reminded patient that refill is due and there is 1 more refill remaining. Patient states he will call [facility] to request refill.   Henrene Pastor, PharmD Clinical Pharmacist [facility]  Population Health [phone]

## 2023-02-24 ENCOUNTER — Other Ambulatory Visit: Payer: Self-pay

## 2023-03-10 ENCOUNTER — Encounter: Payer: Self-pay | Admitting: Internal Medicine

## 2023-03-10 ENCOUNTER — Ambulatory Visit: Payer: Medicare Other | Admitting: Internal Medicine

## 2023-03-10 ENCOUNTER — Other Ambulatory Visit: Payer: Self-pay

## 2023-03-10 VITALS — BP 132/75 | HR 90 | Resp 16 | Ht 62.6 in | Wt 141.0 lb

## 2023-03-10 DIAGNOSIS — L282 Other prurigo: Secondary | ICD-10-CM

## 2023-03-10 DIAGNOSIS — K7689 Other specified diseases of liver: Secondary | ICD-10-CM

## 2023-03-10 DIAGNOSIS — B181 Chronic viral hepatitis B without delta-agent: Secondary | ICD-10-CM

## 2023-03-10 MED ORDER — TRIAMCINOLONE ACETONIDE 0.025 % EX OINT: 1.0000 | TOPICAL_OINTMENT | Freq: Every day | CUTANEOUS | 0 refills | Status: DC

## 2023-03-10 NOTE — Progress Notes (Signed)
 Patient ID: Kevin Hutchinson, male   DOB: [birth year], 89 y.o.   MRN: [id number]  HPI Kevin Hutchinson is an 89yo vietnamese speaking male with hx of chronic hepatitis C. He is here with his wife and interpretor. He continues on tenofovir  for which he takes daily. On review of last abdominal U/S, there is mention of a new cyst on imaging. He denies abdominal pain. He does however report having pruritic rash mostly to arms and legs but also some on torso.   No new detergents  Outpatient Encounter Medications as of 03/10/2023  Medication Sig   metFORMIN  (GLUCOPHAGE -XR) 500 MG 24 hr tablet Take 1 tablet (500 mg total) by mouth 2 (two) times daily before a meal.   pantoprazole  (PROTONIX ) 40 MG tablet Take 1 tablet (40 mg total) by mouth daily before breakfast.   tenofovir  (VIREAD ) 300 MG tablet Take 1 tablet (300 mg total) by mouth daily.   No facility-administered encounter medications on file as of 03/10/2023.     Patient Active Problem List   Diagnosis Date Noted   Cirrhosis of liver without ascites, unspecified hepatic cirrhosis type (HCC) 08/27/2021   Diabetes (HCC) 12/11/2017   Seasonal allergies 06/05/2017   Elevated PSA 06/05/2017   PCP NOTES >>>>>>>>>>>>>>. 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 He appears well-developed and well-nourished. No distress.  HENT:  Mouth/Throat: Oropharynx is clear and moist. No oropharyngeal exudate.  Cardiovascular: Normal rate, regular rhythm and normal heart sounds. Exam reveals no gallop and no friction rub.  No murmur heard.  Pulmonary/Chest: Effort normal and breath sounds normal. No respiratory distress. He has no wheezes.  Abdominal: Soft. Bowel sounds are normal. He exhibits no distension. There is no tenderness.  Lymphadenopathy:  He has no cervical adenopathy.  Neurological: He is alert and oriented to person, place, and time.  Skin: Skin is warm and dry. Pictures placed in media Psychiatric: He has a normal mood and affect. His behavior is normal.    CBC Lab Results  Component Value Date   WBC 10.2 12/17/2022   RBC 5.89 (H) 12/17/2022   HGB 14.7 12/17/2022   HCT 46.8 12/17/2022   PLT 136.0 (L) 12/17/2022   MCV 79.5 12/17/2022   MCH 25.1 (L) 07/17/2020   MCHC 31.5 12/17/2022   RDW 14.8 12/17/2022   LYMPHSABS 4.4 (H) 12/17/2022   MONOABS 0.7 12/17/2022   EOSABS 1.2 (H) 12/17/2022    BMET Lab Results  Component Value Date   NA 139 12/17/2022   K 4.9 12/17/2022   CL 101 12/17/2022   CO2 30 12/17/2022  GLUCOSE 147 (H) 12/17/2022   BUN 19 12/17/2022   CREATININE 1.30 12/17/2022   CALCIUM  9.5 12/17/2022   GFRNONAA 67 11/11/2018   GFRAA 78 11/11/2018      Assessment and Plan Chronic hepatitis B = new lesion on ultrasound concern for HCC, will obtain Liver mri; plan on getting hep B Labs to see still undetectable Refill tenofovir  Long term medication management = will check cr/cmp  Pruritic rash- will do benadryl and triamcinolone - unclear if allergy vs. Related to hep B skin manifestations Refer to derm   I have personally spent 50 minutes involved in face-to-face and non-face-to-face activities for this patient on the day of the visit. Professional  time spent includes the following activities: Preparing to see the patient (review of tests), Obtaining and/or reviewing separately obtained history (admission/discharge record), Performing a medically appropriate examination and/or evaluation , Ordering medications/tests/procedures, referring and communicating with other health care professionals, Documenting clinical information in the EMR, Independently interpreting results (not separately reported), Communicating results to the patient/family/caregiver, Counseling and educating the patient/family/caregiver and Care coordination (not separately reported).

## 2023-03-12 LAB — CBC WITH DIFFERENTIAL/PLATELET
Absolute Lymphocytes: 3108 {cells}/uL (ref 850–3900)
Absolute Monocytes: 574 {cells}/uL (ref 200–950)
Basophils Absolute: 57 {cells}/uL (ref 0–200)
Basophils Relative: 0.7 %
Eosinophils Absolute: 418 {cells}/uL (ref 15–500)
Eosinophils Relative: 5.1 %
HCT: 46.8 % (ref 38.5–50.0)
Hemoglobin: 15.3 g/dL (ref 13.2–17.1)
MCH: 25.2 pg — ABNORMAL LOW (ref 27.0–33.0)
MCHC: 32.7 g/dL (ref 32.0–36.0)
MCV: 77.1 fL — ABNORMAL LOW (ref 80.0–100.0)
MPV: 11.9 fL (ref 7.5–12.5)
Monocytes Relative: 7 %
Neutro Abs: 4043 {cells}/uL (ref 1500–7800)
Neutrophils Relative %: 49.3 %
Platelets: 141 10*3/uL (ref 140–400)
RBC: 6.07 10*6/uL — ABNORMAL HIGH (ref 4.20–5.80)
RDW: 13.8 % (ref 11.0–15.0)
Total Lymphocyte: 37.9 %
WBC: 8.2 10*3/uL (ref 3.8–10.8)

## 2023-03-12 LAB — COMPREHENSIVE METABOLIC PANEL
AG Ratio: 1.6 (calc) (ref 1.0–2.5)
ALT: 26 U/L (ref 9–46)
AST: 26 U/L (ref 10–35)
Albumin: 4.6 g/dL (ref 3.6–5.1)
Alkaline phosphatase (APISO): 74 U/L (ref 35–144)
BUN/Creatinine Ratio: 12 (calc) (ref 6–22)
BUN: 15 mg/dL (ref 7–25)
CO2: 25 mmol/L (ref 20–32)
Calcium: 9.6 mg/dL (ref 8.6–10.3)
Chloride: 103 mmol/L (ref 98–110)
Creat: 1.27 mg/dL — ABNORMAL HIGH (ref 0.70–1.22)
Globulin: 2.9 g/dL (ref 1.9–3.7)
Glucose, Bld: 158 mg/dL — ABNORMAL HIGH (ref 65–99)
Potassium: 5.4 mmol/L — ABNORMAL HIGH (ref 3.5–5.3)
Sodium: 139 mmol/L (ref 135–146)
Total Bilirubin: 0.5 mg/dL (ref 0.2–1.2)
Total Protein: 7.5 g/dL (ref 6.1–8.1)

## 2023-03-12 LAB — HEPATITIS B SURFACE ANTIBODY,QUALITATIVE: Hep B S Ab: NONREACTIVE

## 2023-03-12 LAB — HEPATITIS B DNA, ULTRAQUANTITATIVE, PCR
Hepatitis B DNA: NOT DETECTED [IU]/mL
Hepatitis B virus DNA: NOT DETECTED {Log}

## 2023-03-12 LAB — HEPATITIS B E ANTIBODY: Hep B E Ab: REACTIVE — AB

## 2023-03-13 ENCOUNTER — Other Ambulatory Visit: Payer: Self-pay

## 2023-03-13 NOTE — Progress Notes (Signed)
Specialty Pharmacy Refill Coordination Note  Kevin Hutchinson is a 89 y.o. male contacted today regarding refills of specialty medication(s) Tenofovir Disoproxil Fumarate [name]) Spoke with patient's daughter in law  Patient requested Delivery   Delivery date: 03/21/23   Verified address: [address]   [place] [address]   Medication will be filled on 01.23.25.

## 2023-03-17 ENCOUNTER — Ambulatory Visit (HOSPITAL_COMMUNITY)
Admission: RE | Admit: 2023-03-17 | Discharge: 2023-03-17 | Disposition: A | Discharge status: Home / Self Care | Payer: Medicare Other | Source: Ambulatory Visit | Attending: Internal Medicine | Admitting: Internal Medicine

## 2023-03-17 DIAGNOSIS — K769 Liver disease, unspecified: Secondary | ICD-10-CM | POA: Diagnosis not present

## 2023-03-17 DIAGNOSIS — B181 Chronic viral hepatitis B without delta-agent: Secondary | ICD-10-CM | POA: Insufficient documentation

## 2023-03-17 DIAGNOSIS — K5730 Diverticulosis of large intestine without perforation or abscess without bleeding: Secondary | ICD-10-CM | POA: Diagnosis not present

## 2023-03-17 DIAGNOSIS — K449 Diaphragmatic hernia without obstruction or gangrene: Secondary | ICD-10-CM | POA: Diagnosis not present

## 2023-03-17 MED ORDER — GADOBUTROL 1 MMOL/ML IV SOLN
6.0000 mL | Freq: Once | INTRAVENOUS | Status: AC | PRN
  Administered 2023-03-17: 6 mL via INTRAVENOUS

## 2023-03-20 ENCOUNTER — Other Ambulatory Visit (HOSPITAL_COMMUNITY): Payer: Self-pay

## 2023-03-20 ENCOUNTER — Other Ambulatory Visit: Payer: Self-pay

## 2023-03-20 NOTE — Progress Notes (Signed)
03/20/23 CMA: Kevin Hutchinson will call patient. [employer] (savings program) is closed and there is no assistance for hep-b. May be switching patient's medication.

## 2023-03-21 ENCOUNTER — Other Ambulatory Visit: Payer: Self-pay

## 2023-03-21 ENCOUNTER — Encounter: Payer: Self-pay | Admitting: Gastroenterology

## 2023-03-21 ENCOUNTER — Ambulatory Visit (INDEPENDENT_AMBULATORY_CARE_PROVIDER_SITE_OTHER): Payer: Medicare Other | Admitting: Gastroenterology

## 2023-03-21 ENCOUNTER — Other Ambulatory Visit (HOSPITAL_COMMUNITY): Payer: Self-pay

## 2023-03-21 ENCOUNTER — Telehealth: Payer: Self-pay

## 2023-03-21 VITALS — BP 110/70 | HR 77 | Ht 62.0 in | Wt 142.0 lb

## 2023-03-21 DIAGNOSIS — R195 Other fecal abnormalities: Secondary | ICD-10-CM

## 2023-03-21 DIAGNOSIS — B181 Chronic viral hepatitis B without delta-agent: Secondary | ICD-10-CM | POA: Diagnosis not present

## 2023-03-21 DIAGNOSIS — K219 Gastro-esophageal reflux disease without esophagitis: Secondary | ICD-10-CM

## 2023-03-21 MED ORDER — PANTOPRAZOLE SODIUM 40 MG PO TBEC: 40.0000 mg | DELAYED_RELEASE_TABLET | Freq: Every day | ORAL | 11 refills | Status: AC | PRN

## 2023-03-21 NOTE — Progress Notes (Signed)
HPI :  89 year old male, [place] history of chronic hepatitis B followed by infectious disease on tenofovir, no known cirrhosis, diabetes, referred here by Dr. Porfirio Oar for dark stools, concern for GI bleed.  Translator present today to help with history/communication  On review of chart it appears he was seen back in October for some dark stools.  He reported 1 dark black stool, was concern for blood.  States it was an isolated episode.  He normally has 1 bowel movement every 2 days or so.  Does not see any red or dark black blood in his stool at baseline.  States during the time when he saw the dark stool he was having some indigestion that was bothering him.  Denied any abdominal pains.  Was given pantoprazole 40 mg and states that worked really well to help his indigestion.  He does not typically have any reflux.  Denies any dysphagia.  No nausea or vomiting.  He has had some microcytosis noted on his CBC back in October, MCV 79, iron studies at the time are normal.  Hemoglobin was normal, BUN/creatinine normal at the time of the symptom and he had Hemoccult stools tested which were negative.  Again he has not had any further symptoms concerning like this since then.  Denies any NSAID use routinely.  Has not been using Pepto-Bismol or iron supplementation.  He last had an EGD in 2009, no Barrett's or ulcers noted, did have a benign esophageal stricture at the time  Of note he has a history of chronic hepatitis B followed by infectious disease.  He is on tenofovir.  He has had negative viral load, last checked January 13.  He has had HCC screening with ultrasound which has been negative over time.  Most recent ultrasound was September 2024.  He had any elastography back in 2018 showing perhaps moderate fibrotic risk, however elastography repeated in September 2020 showing low risk for fibrotic change.  His ultrasounds have not shown any overt cirrhosis. He did have an MRI of his liver just done  to further evaluate for cirrhosis or mass lesions.  That has not yet been resulted by radiology.  He has had some mildly low platelet counts into the 120s 130s over the past year.  Labs 03/10/23: Hep B DNA negative Hep B E AB (+) Hep B surface AB (-)   RUQ [place] 11/12/2022: IMPRESSION: 1. No acute abnormality identified. 2. Increased echotexture of the liver which may represent fatty infiltration.  MRI liver 03/17/23: - report pending  Colonoscopy 07/16/2007: Dr. Arlyce Dice - diverticulosis otherwise normal  EGD 07/04/2007 - Dr. Arlyce Dice - benign distal esophageal stricture dilated to 18mm Maloney  Stools heme (-)   Lab Results  Component Value Date   WBC 8.2 03/10/2023   HGB 15.3 03/10/2023   HCT 46.8 03/10/2023   MCV 77.1 (L) 03/10/2023   PLT 141 03/10/2023    Lab Results  Component Value Date   IRON 182 (H) 12/17/2022   FERRITIN 59.4 12/17/2022     Past Medical History:  Diagnosis Date   BPH (benign prostatic hyperplasia) 01/01/2011   Cirrhosis of liver without ascites, unspecified hepatic cirrhosis type (HCC) 08/27/2021   Diabetes (HCC) 12/11/2017   Diverticulosis    GERD (gastroesophageal reflux disease)    EGD 5-09: : Duodenitis without Hemorrhage, Esophageal Stricture.   Hearing loss 03/16/2015   Hepatitis B    Hyperlipidemia    PPD positive    Was Rx INH x 3 months (2008)--HD  Past Surgical History:  Procedure Laterality Date   NO PAST SURGERIES     Family History  Problem Relation Age of Onset   Colon cancer Neg Hx    Stomach cancer Neg Hx    Esophageal cancer Neg Hx    Social History   Tobacco Use   Smoking status: Former   Smokeless tobacco: Never   Tobacco comments:    quit in the 90s  Vaping Use   Vaping status: Never Used  Substance Use Topics   Alcohol use: Not Currently    Comment: RARE   Drug use: No   Current Outpatient Medications  Medication Sig Dispense Refill   metFORMIN (GLUCOPHAGE-XR) 500 MG 24 hr tablet Take 1 tablet (500  mg total) by mouth 2 (two) times daily before a meal. 180 tablet 3   pantoprazole (PROTONIX) 40 MG tablet Take 1 tablet (40 mg total) by mouth daily before breakfast. 30 tablet 3   tenofovir (VIREAD) 300 MG tablet Take 1 tablet (300 mg total) by mouth daily. 30 tablet 11   triamcinolone (KENALOG) 0.025 % ointment Apply 1 Application topically daily. 454 g 0   No current facility-administered medications for this visit.   No Known Allergies   Review of Systems: All systems reviewed and negative except where noted in HPI.   Lab Results  Component Value Date   NA 139 03/10/2023   CL 103 03/10/2023   K 5.4 (H) 03/10/2023   CO2 25 03/10/2023   BUN 15 03/10/2023   CREATININE 1.27 (H) 03/10/2023   GFR 49.80 (L) 12/17/2022   CALCIUM 9.6 03/10/2023   ALBUMIN 4.4 12/17/2022   GLUCOSE 158 (H) 03/10/2023   Lab Results  Component Value Date   ALT 26 03/10/2023   AST 26 03/10/2023   ALKPHOS 60 12/17/2022   BILITOT 0.5 03/10/2023     Physical Exam: BP 110/70   Pulse 77   Ht 5\' 2"  (1.575 m)   Wt 142 lb (64.4 kg)   BMI 25.97 kg/m  Constitutional: Pleasant,well-developed, male in no acute distress. HEENT: Normocephalic and atraumatic. Conjunctivae are normal. No scleral icterus. Neck supple.  Cardiovascular: Normal rate, regular rhythm.  Pulmonary/chest: Effort normal and breath sounds normal. No wheezing, rales or rhonchi. Abdominal: Soft, nondistended, nontender.  There are no masses palpable. No hepatomegaly. Extremities: no edema Neurological: Alert and oriented to person place and time. Skin: Skin is warm and dry. No rashes noted. Psychiatric: Normal mood and affect. Behavior is normal.   ASSESSMENT: 89 y.o. male here for assessment of the following  1. Dark stools   2. Gastroesophageal reflux disease, unspecified whether esophagitis present   3. Hepatitis B, chronic (HCC)    One-time episode of dark stool back in October in the setting of some indigestion/reflux.  He  denies any NSAID use, or other contents he would ingest to cause the dark stool.  At the time his hemoglobin and BUN were normal.  Stool negative for occult blood.  It is unclear if he truly had blood loss causing the symptoms.  He has had no further episodes since then.  On Protonix and that has controlled his reflux very well and is happy with the regimen.  Remote EGD in the past.  We discussed how aggressive he wanted to be with this evaluation.  Given he has not had any recurrence and his labs are stable we could monitor for now and I think that is very reasonable given his age.  We discussed if you  want to pursue an upper endoscopy to rule out upper tract causes, that would entail anesthesia etc.  Following discussion of these options, given he is feeling well without any recurrence and his labs look okay he want to hold off on upper endoscopy and declined that for now.  If he notes any recurrent changes in his stools concerning for blood loss he will contact me.  I refilled pantoprazole for him to use as needed moving forward.  Otherwise his hepatitis B viral load is negative/undetectable, he will continue tenofovir per infectious disease.  He has had an MRI of his liver that has been done and that remains pending, ensure no cirrhosis, will follow-up that report   PLAN: - unclear if this was blood or not in his stools, lHgb and BUN normal -following discussion as above, he is declining EGD will monitor for now - will contact me if recurrent symptoms - refilled protonix to use PRN for reflux  - continue tenofovir and following up with ID - awaiting liver MRI results  Harlin Rain, MD [facility]  CC: Wanda Plump, MD

## 2023-03-21 NOTE — Patient Instructions (Addendum)
_______________________________________________________  If your blood pressure at your visit was 140/90 or greater, please contact your primary care physician to follow up on this.  _______________________________________________________  If you are age 87 or older, your body mass index should be between 23-30. Your Body mass index is 25.97 kg/m. If this is out of the aforementioned range listed, please consider follow up with your Primary Care Provider.  If you are age 26 or younger, your body mass index should be between 19-25. Your Body mass index is 25.97 kg/m. If this is out of the aformentioned range listed, please consider follow up with your Primary Care Provider.   ________________________________________________________  The [facility] providers would like to encourage you to use [facility] to communicate with providers for non-urgent requests or questions.  Due to long hold times on the telephone, sending your provider a message by [facility] may be a faster and more efficient way to get a response.  Please allow 48 business hours for a response.  Please remember that this is for non-urgent requests.  _______________________________________________________  Due to recent changes in healthcare laws, you may see the results of your imaging and laboratory studies on MyChart before your provider has had a chance to review them.  We understand that in some cases there may be results that are confusing or concerning to you. Not all laboratory results come back in the same time frame and the provider may be waiting for multiple results in order to interpret others.  Please give [place] 48 hours in order for your provider to thoroughly review all the results before contacting the office for clarification of your results.   Please contact us if you are having recurrent symptoms  Hy lin h? v?i chng ti n?u b?n c tri?u ch?ng ti pht  We have sent the following medications to your pharmacy for you  to pick up at your convenience: Protonix daily as needed Protonix hng ngy khi c?n thi?t  Thank you for entrusting me with your care and for choosing [facility], Dr. Ileene Patrick

## 2023-03-21 NOTE — Telephone Encounter (Signed)
[facility] Patient Advocate Encounter   I was successful in securing patient a $ 2,000.00 grant from [employer] to provide copayment coverage for Viread.  The patient's out of pocket cost will be 0.00 monthly.     I have spoken with the patient.    The billing information is as follows and has been shared with [facility].            Dates of Eligibility: 03/20/23 through 02/25/24  Patient knows to call the office with questions or concerns.  Clearance Coots, CPhT Specialty Pharmacy Patient [facility] Phone: [phone] Fax:  [phone]

## 2023-03-24 ENCOUNTER — Encounter: Payer: Self-pay | Admitting: Internal Medicine

## 2023-03-24 ENCOUNTER — Telehealth: Payer: Self-pay

## 2023-03-24 ENCOUNTER — Ambulatory Visit (INDEPENDENT_AMBULATORY_CARE_PROVIDER_SITE_OTHER): Payer: Medicare Other | Admitting: Internal Medicine

## 2023-03-24 VITALS — BP 126/76 | HR 80 | Temp 98.0°F | Resp 16 | Ht 62.0 in | Wt 142.5 lb

## 2023-03-24 DIAGNOSIS — Z23 Encounter for immunization: Secondary | ICD-10-CM | POA: Diagnosis not present

## 2023-03-24 DIAGNOSIS — Z0000 Encounter for general adult medical examination without abnormal findings: Secondary | ICD-10-CM | POA: Diagnosis not present

## 2023-03-24 DIAGNOSIS — L299 Pruritus, unspecified: Secondary | ICD-10-CM

## 2023-03-24 DIAGNOSIS — R682 Dry mouth, unspecified: Secondary | ICD-10-CM | POA: Diagnosis not present

## 2023-03-24 DIAGNOSIS — E785 Hyperlipidemia, unspecified: Secondary | ICD-10-CM

## 2023-03-24 DIAGNOSIS — E1169 Type 2 diabetes mellitus with other specified complication: Secondary | ICD-10-CM | POA: Diagnosis not present

## 2023-03-24 DIAGNOSIS — Z7984 Long term (current) use of oral hypoglycemic drugs: Secondary | ICD-10-CM | POA: Diagnosis not present

## 2023-03-24 DIAGNOSIS — Z0001 Encounter for general adult medical examination with abnormal findings: Secondary | ICD-10-CM

## 2023-03-24 LAB — BASIC METABOLIC PANEL
BUN: 18 mg/dL (ref 6–23)
CO2: 29 meq/L (ref 19–32)
Calcium: 9.5 mg/dL (ref 8.4–10.5)
Chloride: 101 meq/L (ref 96–112)
Creatinine, Ser: 1.33 mg/dL (ref 0.40–1.50)
GFR: 48.37 mL/min — ABNORMAL LOW (ref >60.00)
Glucose, Bld: 178 mg/dL — ABNORMAL HIGH (ref 70–99)
Potassium: 4.9 meq/L (ref 3.5–5.1)
Sodium: 138 meq/L (ref 135–145)

## 2023-03-24 LAB — HEMOGLOBIN A1C: Hgb A1c MFr Bld: 8.9 % — ABNORMAL HIGH (ref 4.6–6.5)

## 2023-03-24 LAB — TSH: TSH: 0.72 u[IU]/mL (ref 0.35–5.50)

## 2023-03-24 NOTE — Patient Instructions (Addendum)
Vaccines I recommend: Covid booster Shingrix (shingles) Tdap (tetanus) RSV  Dry skin: No hot showers Aveeno lotion: Daily throughout Dr. Drue Second has refer you to a dermatologist  Dry mouth: Place a humidifier near your bed.    GO TO THE LAB : Get the blood work     Next visit with me in 6 months Please schedule it at the front desk    Per our records you are due for your diabetic eye exam. Please contact your eye doctor to schedule an appointment. Please have them send copies of your office visit notes to [place]. Our fax number is [phone]. If you need a referral to an eye doctor please let us know.        "Health Care Power of attorney" ,  "Living will" (Advance care planning documents)  If you already have a living will or healthcare power of attorney, is recommended you bring the copy to be scanned in your chart.   The document will be available to all the doctors you see in the system.  Advance care planning is a process that supports adults in  understanding and sharing their preferences regarding future medical care.  The patient's preferences are recorded in documents called Advance Directives and the can be modified at any time while the patient is in full mental capacity.   If you don't have one, please consider create one.      More information at: [email or web address]

## 2023-03-24 NOTE — Progress Notes (Signed)
Subjective:    Patient ID: Kevin Hutchinson, male    DOB: 1934/07/27, 89 y.o.   MRN: [id number]  DOS:  03/24/2023 Type of visit - description: CPX, here with his wife and a interpreter  In general feeling well. Reports dry mouth at night for the last 4 weeks. Also, ongoing dry skin and generalized pruritus.  He denies fever or chills. No chest pain no difficulty breathing. No nausea vomiting.  No blood in the stools.   Review of Systems See above   Past Medical History:  Diagnosis Date   BPH (benign prostatic hyperplasia) 01/01/2011   Cirrhosis of liver without ascites, unspecified hepatic cirrhosis type (HCC) 08/27/2021   Diabetes (HCC) 12/11/2017   Diverticulosis    GERD (gastroesophageal reflux disease)    EGD 5-09: : Duodenitis without Hemorrhage, Esophageal Stricture.   Hearing loss 03/16/2015   Hepatitis B    Hyperlipidemia    PPD positive    Was Rx INH x 3 months (2008)--HD      Past Surgical History:  Procedure Laterality Date   NO PAST SURGERIES     Social History   Socioeconomic History   Marital status: Married    Spouse name: Not on file   Number of children: 5   Years of education: Not on file   Highest education level: Not on file  Occupational History   Occupation: n/a  Tobacco Use   Smoking status: Former   Smokeless tobacco: Never   Tobacco comments:    quit in the 90s  Vaping Use   Vaping status: Never Used  Substance and Sexual Activity   Alcohol use: Not Currently    Comment: RARE   Drug use: No   Sexual activity: Not on file  Other Topics Concern   Not on file  Social History Narrative   Household: Lives w/ wife only   FROM [place]   5 children_ 3 live in [place] area    Social Drivers of Health   Financial Resource Strain: Not on file  Food Insecurity: Not on file  Transportation Needs: Not on file  Physical Activity: Not on file  Stress: Not on file  Social Connections: Not on file  Intimate Partner Violence: Not on file      Current Outpatient Medications  Medication Instructions   metFORMIN (GLUCOPHAGE-XR) 500 mg, Oral, 2 times daily before meals   pantoprazole (PROTONIX) 40 mg, Oral, Daily PRN   tenofovir (VIREAD) 300 mg, Oral, Daily   triamcinolone (KENALOG) 0.025 % ointment 1 Application, Topical, Daily       Objective:   Physical Exam BP 126/76   Pulse 80   Temp 98 F (36.7 C) (Oral)   Resp 16   Ht 5\' 2"  (1.575 m)   Wt 142 lb 8 oz (64.6 kg)   SpO2 98%   BMI 26.06 kg/m  General: Well developed, NAD, BMI noted Neck: No  thyromegaly  HEENT:  Normocephalic . Face symmetric, atraumatic Lungs:  CTA B Normal respiratory effort, no intercostal retractions, no accessory muscle use. Heart: RRR,  no murmur.  Abdomen:  Not distended, soft, non-tender. No rebound or rigidity.   Lower extremities: no pretibial edema bilaterally  Skin: Exposed areas-skin looks dry, evidence of previous scratching without obvious rash. Neurologic:  alert & oriented X3.  Speech normal, gait appropriate for age and unassisted Strength symmetric and appropriate for age.  Psych: Cognition and judgment appear intact.  Cooperative with normal attention span and concentration.  Behavior appropriate. No  anxious or depressed appearing.     Assessment     Assessment DM Anemia Dyslipidemia: See OV 08/14/2022, rosuvastatin d/c d/t pruritus GERD: EGD 06-2007: duodenitis, esophageal stricture BPH, slightly increased PSA, declined to see urology 2015, 05/2017.   Chronic hepatitis B H/o + PPD, INH 3 months 2008 HOH, s/p hearing assessment 11/30/2014: Severe hearing loss, has hearing aids   PLAN Here for CPX - Td:2011  -PNM 23: 2009 , 2019;prevnar 2015.  PNM 20: 03/24/2023. - had zostavax before - Flu shot today -Vaccines I recommend: Tdap,   COVID booster, Shingrix, RSV -CCS: Cscope 5-09 @ [place]: TICs. Rec no  further screenings  -Prostate cancer screening: last PSA slt elevated (5.8, close to baseline); rec  no further screening d/t age; has declined see urology before  -Diet-exercise discussed   -Labs: BMP, A1c TSH`  - healthcare power of attorney info provided English, encouraged to discuss with his family Chronic medical issues addressed: GI bleed?: see LOV, was eval by GI, no further evaluation recommended, no further symptoms. DM: On metformin, checking A1c BMP and TSH. Hyperkalemia: Noted on recent labs, recheck. Dyslipidemia: Previously on rosuvastatin, it was d/c due to concerns about pruritus.  Poor compliance with Zetia.  At this point in his life, statins provide only marginal benefits.  Will stop checking FLPs. Pruritus: Has been a ongoing issue, recommend to avoid hot showers, use Aveeno, has been referred to dermatology by ID. Dry mouth: Recommend good hydration and use humidifier at his bedside. All of the above was discussed with the patient via interpreter and he verbalized understanding. RTC 6 months.

## 2023-03-24 NOTE — Assessment & Plan Note (Signed)
Here for CPX - Td:2011  -PNM 23: 2009 , 2019;prevnar 2015.  PNM 20: 03/24/2023. - had zostavax before - Flu shot today -Vaccines I recommend: Tdap,   COVID booster, Shingrix, RSV -CCS: Cscope 5-09 @ [place]: TICs. Rec no  further screenings  -Prostate cancer screening: last PSA slt elevated (5.8, close to baseline); rec no further screening d/t age; has declined see urology before  -Diet-exercise discussed   -Labs: BMP, A1c TSH`  - healthcare power of attorney info provided English, encouraged to discuss with his family

## 2023-03-24 NOTE — Telephone Encounter (Signed)
-----   Message from Benancio Deeds sent at 03/23/2023  7:12 PM EST ----- Regarding: FW: follow up liver MRI from 1/20 Jan you help relay the following to the patient - he requires a Nurse, learning disability to communicate.    Can you let him know his liver has no mass lesions, NO concerning changes for cirrhosis, which is excellent news. Dr. Drue Second will continue to survey his liver with [place] every 6 months as she has been doing. Thanks ----- Message ----- From: Benancio Deeds, MD Sent: 03/21/2023   1:56 PM EST To: Benancio Deeds, MD Subject: follow up liver MRI from 1/20                  Follow-up liver MRI to confirm no cirrhosis etc., report pending at time of clinic visit.

## 2023-03-24 NOTE — Assessment & Plan Note (Signed)
Here for CPX Chronic medical issues addressed: GI bleed?: see LOV, was eval by GI, no further evaluation recommended, no further symptoms. DM: On metformin, checking A1c BMP and TSH. Hyperkalemia: Noted on recent labs, recheck. Dyslipidemia: Previously on rosuvastatin, it was d/c due to concerns about pruritus.  Poor compliance with Zetia.  At this point in his life, statins provide only marginal benefits.  Will stop checking FLPs. Pruritus: Has been a ongoing issue, recommend to avoid hot showers, use Aveeno, has been referred to dermatology by ID. Dry mouth: Recommend good hydration and use humidifier at his bedside. All of the above was discussed with the patient via interpreter and he verbalized understanding. RTC 6 months.

## 2023-03-24 NOTE — Telephone Encounter (Signed)
Using the language line, the patient was called.  He did not answer the phone so the interpretor left a message with the results and recommendations. Patient instructed to call back with any questions.  MyChart message sent to patient.

## 2023-03-28 MED ORDER — METFORMIN HCL ER 500 MG PO TB24: 500.0000 mg | ORAL_TABLET | Freq: Two times a day (BID) | ORAL | 1 refills | Status: DC

## 2023-03-28 NOTE — Addendum Note (Signed)
Addended byConrad Medon D on: 03/28/2023 08:44 AM   Modules accepted: Orders

## 2023-04-18 ENCOUNTER — Other Ambulatory Visit: Payer: Self-pay

## 2023-04-18 NOTE — Progress Notes (Signed)
Specialty Pharmacy Refill Coordination Note  Kevin Hutchinson is a 89 y.o. male contacted today regarding refills of specialty medication(s) Tenofovir Disoproxil Fumarate Kevin Hutchinson)   Patient requested Delivery   Delivery date: 04/24/23   Verified address: [address]   [place] [address]   Medication will be filled on 02.26.25.

## 2023-04-23 ENCOUNTER — Other Ambulatory Visit: Payer: Self-pay

## 2023-05-15 ENCOUNTER — Other Ambulatory Visit: Payer: Self-pay

## 2023-05-19 ENCOUNTER — Other Ambulatory Visit: Payer: Self-pay

## 2023-05-21 ENCOUNTER — Other Ambulatory Visit: Payer: Self-pay

## 2023-05-21 NOTE — Progress Notes (Signed)
 Specialty Pharmacy Refill Coordination Note  Kevin Hutchinson is a 89 y.o. male contacted today regarding refills of specialty medication(s) Tenofovir Disoproxil Fumarate [name])   Patient requested Delivery   Delivery date: 05/26/23   Verified address: [address]   [place] [address]   Medication will be filled on 05/23/23.

## 2023-05-23 ENCOUNTER — Other Ambulatory Visit: Payer: Self-pay

## 2023-06-04 ENCOUNTER — Encounter: Payer: Self-pay | Admitting: Internal Medicine

## 2023-06-13 ENCOUNTER — Other Ambulatory Visit: Payer: Self-pay

## 2023-06-16 ENCOUNTER — Other Ambulatory Visit (HOSPITAL_COMMUNITY): Payer: Self-pay

## 2023-06-16 NOTE — Progress Notes (Signed)
 Specialty Pharmacy Ongoing Clinical Assessment Note  Spoke with patient's daughter. Kevin Hutchinson is a 89 y.o. male who is being followed by the specialty pharmacy service for RxSp Hepatitis B   Patient's specialty medication(s) reviewed today: Tenofovir  Disoproxil Fumarate (VIREAD )   Missed doses in the last 4 weeks: 0   Patient/Caregiver did not have any additional questions or concerns.   Therapeutic benefit summary: Patient is achieving benefit   Adverse events/side effects summary: Experienced adverse events/side effects (patient has a new onset rash, his daughter reports that she is calling the MD to have him seen for this, unsure if it is a side effect of the tenofovir  or unrelated)   Patient's therapy is appropriate to: Continue    Goals Addressed             This Visit's Progress    Maintain optimal adherence to therapy   On track    Patient is on track. Patient will maintain adherence         Follow up:  6 months  Malachi Screws Specialty Pharmacist

## 2023-06-16 NOTE — Progress Notes (Signed)
 Specialty Pharmacy Refill Coordination Note  Spoke with patient's daughter. Uthman SUMMIT ARROYAVE is a 89 y.o. male contacted today regarding refills of specialty medication(s) Tenofovir  Disoproxil Fumarate (VIREAD )   Patient requested Delivery   Delivery date: 06/20/23   Verified address: [address]   [place] [address]   Medication will be filled on 06/19/23.

## 2023-07-08 ENCOUNTER — Other Ambulatory Visit: Payer: Self-pay

## 2023-07-10 ENCOUNTER — Other Ambulatory Visit: Payer: Self-pay

## 2023-07-10 ENCOUNTER — Ambulatory Visit: Payer: Medicare Other | Admitting: Internal Medicine

## 2023-07-14 ENCOUNTER — Other Ambulatory Visit (HOSPITAL_COMMUNITY): Payer: Self-pay

## 2023-07-17 ENCOUNTER — Other Ambulatory Visit: Payer: Self-pay | Admitting: Internal Medicine

## 2023-08-11 ENCOUNTER — Other Ambulatory Visit: Payer: Self-pay

## 2023-08-11 ENCOUNTER — Other Ambulatory Visit

## 2023-08-11 ENCOUNTER — Ambulatory Visit: Admitting: Internal Medicine

## 2023-08-11 ENCOUNTER — Other Ambulatory Visit: Payer: Self-pay | Admitting: Internal Medicine

## 2023-08-11 DIAGNOSIS — B181 Chronic viral hepatitis B without delta-agent: Secondary | ICD-10-CM

## 2023-08-11 NOTE — Addendum Note (Signed)
 Addended by: Hendricks Locker on: 08/11/2023 10:36 AM   Modules accepted: Orders

## 2023-08-13 ENCOUNTER — Other Ambulatory Visit (HOSPITAL_COMMUNITY): Payer: Self-pay

## 2023-08-13 LAB — COMPREHENSIVE METABOLIC PANEL WITH GFR
AG Ratio: 1.5 (calc) (ref 1.0–2.5)
ALT: 23 U/L (ref 9–46)
AST: 22 U/L (ref 10–35)
Albumin: 4.4 g/dL (ref 3.6–5.1)
Alkaline phosphatase (APISO): 71 U/L (ref 35–144)
BUN/Creatinine Ratio: 14 (calc) (ref 6–22)
BUN: 18 mg/dL (ref 7–25)
CO2: 23 mmol/L (ref 20–32)
Calcium: 9.3 mg/dL (ref 8.6–10.3)
Chloride: 102 mmol/L (ref 98–110)
Creat: 1.27 mg/dL — ABNORMAL HIGH (ref 0.70–1.22)
Globulin: 2.9 g/dL (ref 1.9–3.7)
Glucose, Bld: 162 mg/dL — ABNORMAL HIGH (ref 65–99)
Potassium: 4.6 mmol/L (ref 3.5–5.3)
Sodium: 139 mmol/L (ref 135–146)
Total Bilirubin: 0.3 mg/dL (ref 0.2–1.2)
Total Protein: 7.3 g/dL (ref 6.1–8.1)
eGFR: 55 mL/min/{1.73_m2} — ABNORMAL LOW (ref >=60)

## 2023-08-13 LAB — AFP TUMOR MARKER: AFP-Tumor Marker: 3.6 ng/mL (ref <6.1)

## 2023-08-13 LAB — HEPATITIS B DNA, ULTRAQUANTITATIVE, PCR
Hepatitis B DNA: NOT DETECTED [IU]/mL
Hepatitis B virus DNA: NOT DETECTED {Log_IU}/mL

## 2023-08-13 LAB — HEPATITIS B SURFACE ANTIBODY,QUALITATIVE: Hep B S Ab: NONREACTIVE

## 2023-08-22 ENCOUNTER — Other Ambulatory Visit: Payer: Self-pay

## 2023-08-22 ENCOUNTER — Other Ambulatory Visit (HOSPITAL_COMMUNITY): Payer: Self-pay

## 2023-08-22 ENCOUNTER — Ambulatory Visit: Admitting: Infectious Diseases

## 2023-08-22 ENCOUNTER — Encounter: Payer: Self-pay | Admitting: Infectious Diseases

## 2023-08-22 VITALS — BP 146/79 | HR 79 | Temp 97.7°F | Ht 62.0 in | Wt 142.0 lb

## 2023-08-22 DIAGNOSIS — L282 Other prurigo: Secondary | ICD-10-CM | POA: Diagnosis not present

## 2023-08-22 DIAGNOSIS — B181 Chronic viral hepatitis B without delta-agent: Secondary | ICD-10-CM | POA: Diagnosis not present

## 2023-08-22 MED ORDER — TENOFOVIR DISOPROXIL FUMARATE 300 MG PO TABS: 300.0000 mg | ORAL_TABLET | Freq: Every day | ORAL | 3 refills | Status: AC

## 2023-08-22 MED ORDER — TRIAMCINOLONE ACETONIDE 0.025 % EX OINT: 1.0000 | TOPICAL_OINTMENT | Freq: Every day | CUTANEOUS | 0 refills | Status: AC

## 2023-08-22 MED ORDER — TENOFOVIR DISOPROXIL FUMARATE 300 MG PO TABS: 300.0000 mg | ORAL_TABLET | Freq: Every day | ORAL | 3 refills | Status: DC

## 2023-08-22 NOTE — Progress Notes (Signed)
 [place] and will need a 90 day supply and will have to fill with [facility] .

## 2023-08-22 NOTE — Progress Notes (Signed)
 Patient: Kevin Hutchinson  DOB: [birth year] MRN: [id number] PCP: Amon Aloysius BRAVO, MD   Reason for Visit: chronic hepatitis B follow up   Chief Complaint  Patient presents with   Follow-up      Subjective   Subjective:   Chief Complaint  Patient presents with   Follow-up     Discussed the use of AI scribe software for clinical note transcription with the patient, who gave verbal consent to proceed.  History of Present Illness   Kevin Hutchinson is an 89 year old male with hepatitis B who presents for a follow-up visit. He last saw Dr. Luiz in January 2025.   He has been experiencing issues with obtaining his hepatitis B medication, which has been unavailable for two weeks. He is planning to travel overseas to [place] for three months starting in July, necessitating an adequate supply of medication for this period. No side effects from his current medication, and recent blood work shows normal liver function tests and no active hepatitis B in the blood.  He has been experiencing itchiness and a rash for the past month. The rash is described as very itchy, but there is no pain associated with it. This is a new symptom, and he has not experienced it before. He has an appointment with his regular doctor to further evaluate this issue.  His recent liver imaging results are not available in the current records, but he mentions that imaging was done last month.        ROS  Past Medical History:  Diagnosis Date   BPH (benign prostatic hyperplasia) 01/01/2011   Cirrhosis of liver without ascites, unspecified hepatic cirrhosis type (HCC) 08/27/2021   Diabetes (HCC) 12/11/2017   Diverticulosis    GERD (gastroesophageal reflux disease)    EGD 5-09: : Duodenitis without Hemorrhage, Esophageal Stricture.   Hearing loss 03/16/2015   Hepatitis B    Hyperlipidemia    PPD positive    Was Rx INH x 3 months (2008)--HD      Outpatient Medications Prior to Visit  Medication Sig Dispense  Refill   metFORMIN  (GLUCOPHAGE -XR) 500 MG 24 hr tablet Take 1 tablet (500 mg total) by mouth 2 (two) times daily with a meal. 180 tablet 1   pantoprazole  (PROTONIX ) 40 MG tablet Take 1 tablet (40 mg total) by mouth daily as needed. 30 tablet 11   tenofovir  (VIREAD ) 300 MG tablet Take 1 tablet (300 mg total) by mouth daily. 30 tablet 11   triamcinolone  (KENALOG ) 0.025 % ointment Apply 1 Application topically daily. 454 g 0   No facility-administered medications prior to visit.     No Known Allergies  Social History   Tobacco Use   Smoking status: Former   Smokeless tobacco: Never   Tobacco comments:    quit in the 90s  Vaping Use   Vaping status: Never Used  Substance Use Topics   Alcohol use: Not Currently    Comment: RARE   Drug use: No    Family History  Problem Relation Age of Onset   Colon cancer Neg Hx    Stomach cancer Neg Hx    Esophageal cancer Neg Hx        Objective   Objective:   Vitals:   08/22/23 1023  BP: (!) 146/79  Pulse: 79  Temp: 97.7 F (36.5 C)  TempSrc: Temporal  SpO2: 94%  Weight: 142 lb (64.4 kg)  Height: 5' 2 (1.575 m)   Body mass  index is 25.97 kg/m.  Physical Exam            Assessment and Plan    Chronic Hepatitis B Infection  - On Tx since 2015 for Chronic e-antigen producing infection -  Hepatitis B is well-controlled with current medication. MRI in January reviewed- No suspicious liver lesions or contrast enhancement. No morphologic findings of cirrhosis or portal hypertension. No masses. Liver function tests are normal, and there is no active hepatitis B in the blood. AFP levels normal. No concerns regarding liver health at this time. Defer to Dr. Luiz if they want to continue [facility] screenings at his age. Will try to arrange a 90-day fill for their upcoming travel plans to [place]. If they prolong their stay recommend local evaluation to see if they can get a refill on his Viread  while out of country. Unable to  dispense > 90d.  - Reorder hepatitis B medication and update prescription - need to send to [facility] pharmacy for 90d with out of country travel.  - If travel is extended, will need to see if he can be seen by health care team locally for another refill while out of country.  - Defer to Dr. Luiz if they want to continue [facility] screenings at his age. - They will call to schedule a follow up in 6 months when they return from travel.    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 Luiz and appears to be a more chronic problem than what they initially discussed.  It is widely distributed to arms, legs and torso. There is one larger soft purple/red mass on the posterior left calf.  Attempted referral to Derm which they did not call back to schedule unfortunately. With his prolonged travel coming up will not re-open referral at the moment.  No clear etiology identified. His hepatitis B viral loads have remained undetectable - can still have some cutaneous phenomena even in virologically suppressed people, He is not iron deficient. TSH normal. He has taken no medications in the last 2 weeks and still pruritis persists.  ?lichen planus papules/nodules vs Vasculitis  - Unclear etiology - would get his PCP's ideas for evaluation of rash.  - ?lichen planus like syndrome - would continue topical steroids  - Dermatology when he returns. - Refill triamcinolone  cream - may need stronger if LP - antihistamines PRN for itching  - Document and photograph rash for medical records.        Orders Placed This Encounter  Procedures   Hepatitis B surface antigen    Standing Status:   Future    Expected Date:   02/21/2024    Expiration Date:   05/21/2024   COMPLETE METABOLIC PANEL WITHOUT GFR    Standing Status:   Future    Expected Date:   02/21/2024    Expiration Date:    05/21/2024   Hepatitis B DNA, ultraquantitative, PCR    Standing Status:   Future    Expected Date:   02/21/2024    Expiration Date:   05/21/2024   Protime-INR    Standing Status:   Future    Expected Date:   02/21/2024    Expiration Date:   05/21/2024   CBC w/Diff    Standing Status:   Future    Expected Date:   02/21/2024    Expiration Date:  05/21/2024   AFP tumor marker    Standing Status:   Future    Expected Date:   02/21/2024    Expiration Date:   05/21/2024    Meds ordered this encounter  Medications   DISCONTD: tenofovir  (VIREAD ) 300 MG tablet    Sig: Take 1 tablet (300 mg total) by mouth daily.    Dispense:  30 tablet    Refill:  3    Patient will need 90 days for out of country travel.   tenofovir  (VIREAD ) 300 MG tablet    Sig: Take 1 tablet (300 mg total) by mouth daily.    Dispense:  90 tablet    Refill:  3    Patient will need 90 days for out of country travel.   triamcinolone  (KENALOG ) 0.025 % ointment    Sig: Apply 1 Application topically daily.    Dispense:  454 g    Refill:  0    Return in about 6 months (around 02/21/2024).   Corean Fireman, MSN, NP-C [facility]  [email or web address] Pager: [phone] Office: [phone] [facility] Main Line: [phone] *Secure Chat Communication Welcome

## 2023-08-22 NOTE — Patient Instructions (Addendum)
 Please come back to see Dr. Luiz in 6 months with labs prior to your visit   Your liver looked great on the MRI recently   Will send in a request to the mail order pharmacy through insurance to request 90 day supply for your travel.  If you stay longer will need to see local healthcare team to get on something similar.   Bring your medication with you!

## 2023-08-27 ENCOUNTER — Encounter: Payer: Self-pay | Admitting: Internal Medicine

## 2023-08-27 ENCOUNTER — Ambulatory Visit: Admitting: Internal Medicine

## 2023-08-27 VITALS — BP 126/80 | HR 87 | Temp 97.8°F | Resp 16 | Ht 62.0 in | Wt 142.5 lb

## 2023-08-27 DIAGNOSIS — Z603 Acculturation difficulty: Secondary | ICD-10-CM

## 2023-08-27 DIAGNOSIS — R21 Rash and other nonspecific skin eruption: Secondary | ICD-10-CM

## 2023-08-27 DIAGNOSIS — Z758 Other problems related to medical facilities and other health care: Secondary | ICD-10-CM | POA: Diagnosis not present

## 2023-08-27 DIAGNOSIS — Z7984 Long term (current) use of oral hypoglycemic drugs: Secondary | ICD-10-CM

## 2023-08-27 DIAGNOSIS — E1165 Type 2 diabetes mellitus with hyperglycemia: Secondary | ICD-10-CM | POA: Diagnosis not present

## 2023-08-27 DIAGNOSIS — L299 Pruritus, unspecified: Secondary | ICD-10-CM

## 2023-08-27 NOTE — Assessment & Plan Note (Signed)
 DM: Not well-controlled per last A1c which was 8.9. We reach out to the pharmacy at that time and apparently he was not been taking metformin  as prescribed. We advised about Januvia and left a message for the patient's daughter but we never got a call back. Plan: rec good metformin  compliance, Check A1c, further advise w/ results. Chronic hep B: LOV ID 08/22/2023, rec to continue Viread .  Ran out of medication about 3 weeks ago, encouraged to reach out to ID. Rash, pruritus: Etiology unclear, the patient will leave to [place] soon and will not come back until September.  I see that he is scheduled to see dermatology 12/17/2023.  Strong encouraged to keep the appointment.   Social: Communication is difficult even with the translator.  Provided advice: Is important he brings not only a Nurse, learning disability but a family member fluent in English that hopefully could take notes and make sure the patient follows my instructions  RTC 3 months.

## 2023-08-27 NOTE — Patient Instructions (Addendum)
 Is important you get a translator for every visit.  It is also very important you bring one of your children with you at every visit so they can help you with your medical care.  Please reach out to the infectious diseases doctor and ask about the medication for hepatitis B you have not been able to get.  Take metformin  as recommended.  You are scheduled for a visit with a dermatologist December 17, 2023.  Be sure you see them.   GO TO THE LAB :  Get the blood work   Your results will be posted on MyChart with my comments  Next office visit for a checkup in 3 months Please make an appointment before you leave today

## 2023-08-27 NOTE — Progress Notes (Signed)
 Subjective:    Patient ID: Kevin Hutchinson, male    DOB: 08/10/1934, 89 y.o.   MRN: [id number]  DOS:  08/27/2023 Type of visit - description: Here with his wife, virtual translator present  Here w/ his wife We discussed DM; saw ID, note reviewed   Review of Systems See above   Past Medical History:  Diagnosis Date   BPH (benign prostatic hyperplasia) 01/01/2011   Cirrhosis of liver without ascites, unspecified hepatic cirrhosis type (HCC) 08/27/2021   Diabetes (HCC) 12/11/2017   Diverticulosis    GERD (gastroesophageal reflux disease)    EGD 5-09: : Duodenitis without Hemorrhage, Esophageal Stricture.   Hearing loss 03/16/2015   Hepatitis B    Hyperlipidemia    PPD positive    Was Rx INH x 3 months (2008)--HD      Past Surgical History:  Procedure Laterality Date   NO PAST SURGERIES      Current Outpatient Medications  Medication Instructions   metFORMIN  (GLUCOPHAGE -XR) 500 mg, Oral, 2 times daily with meals   pantoprazole  (PROTONIX ) 40 mg, Oral, Daily PRN   tenofovir  (VIREAD ) 300 mg, Oral, Daily   triamcinolone  (KENALOG ) 0.025 % ointment 1 Application, Topical, Daily       Objective:   Physical Exam BP 126/80   Pulse 87   Temp 97.8 F (36.6 C) (Oral)   Resp 16   Ht 5' 2 (1.575 m)   Wt 142 lb 8 oz (64.6 kg)   SpO2 93%   BMI 26.06 kg/m  General:   Well developed, NAD, BMI noted. HEENT:  Normocephalic . Face symmetric, atraumatic Lungs:  CTA B Normal respiratory effort, no intercostal retractions, no accessory muscle use. Heart: RRR,  no murmur.  Lower extremities: no pretibial edema bilaterally  Skin: multiple skin lesions, ~ 2 mm in size, hyperpigmented; has a large one at the L calf, papular, irregular surface , see picture  Neurologic:  alert & oriented X3.  Speech normal, gait appropriate for age and unassisted Psych--  Behavior appropriate. No anxious or depressed appearing.      Assessment     Assessment DM Anemia Dyslipidemia: See OV  03/24/2023: Stop checking FLP's, on no meds. GERD: EGD 06-2007: duodenitis, esophageal stricture BPH, slightly increased PSA, declined to see urology 2015, 05/2017.   Chronic hepatitis B H/o + PPD, INH 3 months 2008 HOH, s/p hearing assessment 11/30/2014: Severe hearing loss, has hearing aids   PLAN DM: Not well-controlled per last A1c which was 8.9. We reach out to the pharmacy at that time and apparently he was not been taking metformin  as prescribed. We advised about Januvia and left a message for the patient's daughter but we never got a call back. Plan: rec good metformin  compliance, Check A1c, further advise w/ results. Chronic hep B: LOV ID 08/22/2023, rec to continue Viread .  Ran out of medication about 3 weeks ago, encouraged to reach out to ID. Rash, pruritus: Etiology unclear, the patient will leave to [place] soon and will not come back until September.  I see that he is scheduled to see dermatology 12/17/2023.  Strong encouraged to keep the appointment.   Social: Communication is difficult even with the translator.  Provided advice: Is important he brings not only a Nurse, learning disability but a family member fluent in English that hopefully could take notes and make sure the patient follows my instructions  RTC 3 months.  Time spent 33 minutes, advising patient about compliance, the need to bring a family member.

## 2023-08-28 ENCOUNTER — Other Ambulatory Visit

## 2023-08-28 DIAGNOSIS — E1165 Type 2 diabetes mellitus with hyperglycemia: Secondary | ICD-10-CM | POA: Diagnosis not present

## 2023-08-29 ENCOUNTER — Ambulatory Visit: Payer: Self-pay | Admitting: Internal Medicine

## 2023-08-29 LAB — HEMOGLOBIN A1C
Est. average glucose Bld gHb Est-mCnc: 232 mg/dL
Hgb A1c MFr Bld: 9.7 % — ABNORMAL HIGH (ref 4.8–5.6)

## 2023-09-02 MED ORDER — SITAGLIPTIN PHOSPHATE 100 MG PO TABS: 100.0000 mg | ORAL_TABLET | Freq: Every day | ORAL | 5 refills | Status: AC

## 2023-09-02 NOTE — Telephone Encounter (Signed)
 Notified [name] and he voices understanding.  Rx sent to [facility] on [address]. Kevin Hutchinson states that pt is out of the country until October and will not be able to start medication until then.  He also requests results/instructions be mailed to patient's home address and that was completed.

## 2023-09-24 ENCOUNTER — Ambulatory Visit: Admitting: Internal Medicine

## 2023-09-25 ENCOUNTER — Encounter: Payer: Self-pay | Admitting: Pharmacist

## 2023-09-25 NOTE — Progress Notes (Signed)
 Pharmacy Quality Measure Review  This patient is appearing on a report for being at risk of failing the adherence measure for diabetes medications this calendar year.   Medication: metformin  XR 500mg  Last fill date: 04/28/2023 for 90 day supply per adherence report but was actually last refilled for 90 days on 08/12/2023. He has 1 refill remaining.   I spoke to Mr Rizor's Naseem, Karleen today.   Mr. Harkin is out of the country in [place] until the beginning of October. His last A1c was > 9.0%. Dr Amon added Januvia  to his regimen but he has not started because he had already left the country when prescription was prescribed. It looks like patient also might not have filled Viread  before he left.   Discussed with Karleen if he might be able to mail Mr. Mokry his Januvia  and Viread . He states maybe but he has not done that before and is not sure how long it would take.   Encouraged Karleen to discuss with his father possibly mailing any needed medications.  Patient will return the first week of October and see Dr Amon the following week.   Madelin Ray, PharmD Clinical Pharmacist [facility] Primary Care  Population Health [phone]

## 2023-09-26 ENCOUNTER — Ambulatory Visit: Payer: Medicare Other | Admitting: Internal Medicine

## 2023-11-05 ENCOUNTER — Other Ambulatory Visit (HOSPITAL_COMMUNITY): Payer: Self-pay

## 2023-11-26 ENCOUNTER — Encounter: Payer: Self-pay | Admitting: Pharmacist

## 2023-11-26 NOTE — Progress Notes (Signed)
 Pharmacy Quality Measure Review  This patient is appearing on a report for being at risk of failing the adherence measure for diabetes medications this calendar year.   Medication: metformin  XR 500mg  Last fill date: 08/25/2023 for 90 day supply per adherence report.  Reviewed recent refill history in Dr Annemarie database. Actual last refill date was 11/18/2023 for 90 day supply but it does not look like it has been picked up yet. Expect patient will pick up when he returns from [place] which should be in the next 1 ro 2 weeks.  Patient has no refills remaining. Next appointment with PCP is soon 12/05/2023.    His last A1c was > 9.0%. Dr Amon added Januvia  to his regimen but he has not started because he had already left the country when prescription was prescribed. I have discussed this with patient's Dimitry in the past. Encouraged his to discuss with his father possibly mailing any needed medications.    Madelin Ray, PharmD Clinical Pharmacist [facility] Primary Care  Population Health [phone]

## 2023-12-04 DIAGNOSIS — Z603 Acculturation difficulty: Secondary | ICD-10-CM | POA: Insufficient documentation

## 2023-12-04 DIAGNOSIS — Z91199 Patient's noncompliance with other medical treatment and regimen due to unspecified reason: Secondary | ICD-10-CM | POA: Insufficient documentation

## 2023-12-05 ENCOUNTER — Ambulatory Visit: Admitting: Internal Medicine

## 2023-12-05 ENCOUNTER — Encounter: Payer: Self-pay | Admitting: Internal Medicine

## 2023-12-05 VITALS — BP 122/80 | HR 78 | Temp 98.0°F | Resp 16 | Ht 62.0 in | Wt 139.2 lb

## 2023-12-05 DIAGNOSIS — E119 Type 2 diabetes mellitus without complications: Secondary | ICD-10-CM

## 2023-12-05 DIAGNOSIS — E785 Hyperlipidemia, unspecified: Secondary | ICD-10-CM | POA: Diagnosis not present

## 2023-12-05 DIAGNOSIS — Z7984 Long term (current) use of oral hypoglycemic drugs: Secondary | ICD-10-CM

## 2023-12-05 DIAGNOSIS — Z603 Acculturation difficulty: Secondary | ICD-10-CM | POA: Diagnosis not present

## 2023-12-05 DIAGNOSIS — Z758 Other problems related to medical facilities and other health care: Secondary | ICD-10-CM | POA: Diagnosis not present

## 2023-12-05 DIAGNOSIS — B181 Chronic viral hepatitis B without delta-agent: Secondary | ICD-10-CM | POA: Diagnosis not present

## 2023-12-05 DIAGNOSIS — E1165 Type 2 diabetes mellitus with hyperglycemia: Secondary | ICD-10-CM

## 2023-12-05 DIAGNOSIS — Z91199 Patient's noncompliance with other medical treatment and regimen due to unspecified reason: Secondary | ICD-10-CM

## 2023-12-05 LAB — LIPID PANEL
Cholesterol: 217 mg/dL — ABNORMAL HIGH (ref 0–200)
HDL: 30.2 mg/dL — ABNORMAL LOW (ref >39.00)
LDL Cholesterol: 117 mg/dL — ABNORMAL HIGH (ref 0–99)
NonHDL: 186.74
Total CHOL/HDL Ratio: 7
Triglycerides: 347 mg/dL — ABNORMAL HIGH (ref 0.0–149.0)
VLDL: 69.4 mg/dL — ABNORMAL HIGH (ref 0.0–40.0)

## 2023-12-05 LAB — CBC WITH DIFFERENTIAL/PLATELET
Basophils Absolute: 0 K/uL (ref 0.0–0.1)
Basophils Relative: 0.4 % (ref 0.0–3.0)
Eosinophils Absolute: 0.1 K/uL (ref 0.0–0.7)
Eosinophils Relative: 1.8 % (ref 0.0–5.0)
HCT: 45.7 % (ref 39.0–52.0)
Hemoglobin: 14.8 g/dL (ref 13.0–17.0)
Lymphocytes Relative: 44.6 % (ref 12.0–46.0)
Lymphs Abs: 3.7 K/uL (ref 0.7–4.0)
MCHC: 32.3 g/dL (ref 30.0–36.0)
MCV: 77.9 fl — ABNORMAL LOW (ref 78.0–100.0)
Monocytes Absolute: 0.7 K/uL (ref 0.1–1.0)
Monocytes Relative: 8.1 % (ref 3.0–12.0)
Neutro Abs: 3.7 K/uL (ref 1.4–7.7)
Neutrophils Relative %: 45.1 % (ref 43.0–77.0)
Platelets: 108 K/uL — ABNORMAL LOW (ref 150.0–400.0)
RBC: 5.87 Mil/uL — ABNORMAL HIGH (ref 4.22–5.81)
RDW: 14.6 % (ref 11.5–15.5)
WBC: 8.3 K/uL (ref 4.0–10.5)

## 2023-12-05 LAB — BASIC METABOLIC PANEL WITH GFR
BUN: 19 mg/dL (ref 6–23)
CO2: 29 meq/L (ref 19–32)
Calcium: 9.3 mg/dL (ref 8.4–10.5)
Chloride: 101 meq/L (ref 96–112)
Creatinine, Ser: 1.27 mg/dL (ref 0.40–1.50)
GFR: 50.87 mL/min — ABNORMAL LOW (ref >60.00)
Glucose, Bld: 272 mg/dL — ABNORMAL HIGH (ref 70–99)
Potassium: 4.7 meq/L (ref 3.5–5.1)
Sodium: 138 meq/L (ref 135–145)

## 2023-12-05 LAB — HEMOGLOBIN A1C: Hgb A1c MFr Bld: 12.3 % — ABNORMAL HIGH (ref 4.6–6.5)

## 2023-12-05 NOTE — Progress Notes (Signed)
   Subjective:    Patient ID: Kevin Hutchinson, male    DOB: 1934-04-04, 89 y.o.   MRN: [id number]  DOS:  12/05/2023 Follow-up Discussed the use of AI scribe software for clinical note transcription with the patient, who gave verbal consent to proceed.  History of Present Illness  Hearing impairment - Significant hearing difficulties requiring assistance from his wife for communication during the visit  Diabetes mellitus management - Diabetes managed with metformin  and Januvia  - Adheres to prescribed medication regimen  Dyslipidemia management - Dyslipidemia controlled through dietary measures  Chronic hepatitis b - Chronic hepatitis B under management by infectious disease clinic  Dermatologic symptoms - Rash and pruritus present - Dermatology appointment scheduled for further evaluation     Review of Systems See above   Past Medical History:  Diagnosis Date   BPH (benign prostatic hyperplasia) 01/01/2011   Cirrhosis of liver without ascites, unspecified hepatic cirrhosis type (HCC) 08/27/2021   Diabetes (HCC) 12/11/2017   Diverticulosis    GERD (gastroesophageal reflux disease)    EGD 5-09: : Duodenitis without Hemorrhage, Esophageal Stricture.   Hearing loss 03/16/2015   Hepatitis B    Hyperlipidemia    PPD positive    Was Rx INH x 3 months (2008)--HD      Past Surgical History:  Procedure Laterality Date   NO PAST SURGERIES      Current Outpatient Medications  Medication Instructions   metFORMIN  (GLUCOPHAGE -XR) 500 mg, Oral, 2 times daily with meals   pantoprazole  (PROTONIX ) 40 mg, Oral, Daily PRN   sitaGLIPtin  (JANUVIA ) 100 mg, Oral, Daily with breakfast   tenofovir  (VIREAD ) 300 mg, Oral, Daily   triamcinolone  (KENALOG ) 0.025 % ointment 1 Application, Topical, Daily       Objective:   Physical Exam BP 122/80   Pulse 78   Temp 98 F (36.7 C) (Oral)   Resp 16   Ht 5' 2 (1.575 m)   Wt 139 lb 4 oz (63.2 kg)   SpO2 96%   BMI 25.47 kg/m  General:    Well developed, NAD, BMI noted. HEENT:  Normocephalic . Face symmetric, atraumatic.  HOH, questions answered by the wife Lungs:  CTA B Normal respiratory effort, no intercostal retractions, no accessory muscle use. Heart: RRR,  no murmur.  Lower extremities: no pretibial edema bilaterally  Skin: Not pale. Not jaundice Neurologic:  alert & oriented X3.  Speech normal, gait appropriate for age and unassisted Psych--  Behavior appropriate. No anxious or depressed appearing.      Assessment   Assessment DM Anemia Dyslipidemia: See OV 03/24/2023: Stop checking FLP's, on no meds. GERD: EGD 06-2007: duodenitis, esophageal stricture BPH, slightly increased PSA, declined to see urology 2015, 05/2017.   Chronic hepatitis B H/o + PPD, INH 3 months 2008 HOH, s/p hearing assessment 11/30/2014: Severe hearing loss, has hearing aids   Assessment & Plan DM: Last A1c elevated in the context of the patient not taking any metformin , today he reports good compliance with metformin  and Januvia . - Check A1c and BMP. High cholesterol Managed with diet control. Check FLP. Chronic hepatitis B  follow-up by Infectious Disease clinic. Rash and pruritus See LOV, strongly recommend him to keep the appointment with dermatology   on December 17, 2023. General Health Maintenance Flu and COVID vaccinations recommended.  Declines.  Benefits discussed with the patient. Social: Communication with the patient was via interpreter over the phone.  He is HOH,  questions answered his wife. RTC 4 months.

## 2023-12-05 NOTE — Patient Instructions (Addendum)
 GO TO THE LAB :  Get the blood work    Then, go to the front desk for the checkout Please make an appointment for a physical exam in 4 months  See the dermatologist as scheduled on October 22  Please read more detailed instructions below      TYPE 2 DIABETES MELLITUS: Your diabetes is being managed with metformin  and Januvia . -We will check your A1c and BMP to monitor your diabetes.  DYSLIPIDEMIA: Your cholesterol levels are being controlled through your diet. -We will check your FLP to monitor your cholesterol levels.  CHRONIC HEPATITIS B: Your chronic hepatitis B is being managed by the Infectious Disease clinic. -Continue your follow-ups with the Infectious Disease clinic.  RASH AND PRURITUS: You have a rash and itching that will be evaluated further by dermatology. -Remember your dermatology appointment on December 17, 2023.  GENERAL HEALTH MAINTENANCE: We discussed vaccinations to keep you healthy. -I recommend you get a flu shot and a COVID-vaccine

## 2023-12-06 NOTE — Assessment & Plan Note (Signed)
 DM: Last A1c elevated in the context of the patient not taking any metformin , today he reports good compliance with metformin  and Januvia . - Check A1c and BMP. High cholesterol Managed with diet control. Check FLP. Chronic hepatitis B  follow-up by Infectious Disease clinic. Rash and pruritus See LOV, strongly recommend him to keep the appointment with dermatology   on December 17, 2023. General Health Maintenance Flu and COVID vaccinations recommended.  Declines.  Benefits discussed with the patient. Social: Communication with the patient was via interpreter over the phone.  He is HOH,  questions answered his wife. RTC 4 months.

## 2023-12-08 ENCOUNTER — Ambulatory Visit: Payer: Self-pay | Admitting: Internal Medicine

## 2023-12-08 DIAGNOSIS — E1165 Type 2 diabetes mellitus with hyperglycemia: Secondary | ICD-10-CM

## 2023-12-08 DIAGNOSIS — Z758 Other problems related to medical facilities and other health care: Secondary | ICD-10-CM

## 2023-12-16 ENCOUNTER — Telehealth: Payer: Self-pay | Admitting: Pharmacist

## 2023-12-16 NOTE — Telephone Encounter (Signed)
 Okay thank you

## 2023-12-16 NOTE — Progress Notes (Signed)
 Pharmacy Quality Measure Review  This patient is appearing on a report for being at risk of failing the adherence measure for diabetes medications this calendar year.   Medication: metformin   Last fill date: 08/12/2023 for 90 day supply. Has 1 refill remaining.  Medication: Januvia  100mg  Last fill date: 09/03/2023 for 30 day supply. Has 5 refills remaining.  Patient was noted to be out of the country for the last few months and returned in early October.  I has spoken with his Kairyn in the past about low adherence to diabetes medications. At that time Isao did not know what he could do since patient was out of the country.  Since patient has returned he still has not filled either of his medications for diabetes.   Lab Results  Component Value Date   HGBA1C 12.3 (H) 12/05/2023   Patient has been referred to endocrinology for further treatment recommendations but looks like adherence is likely contributing to poorly controlled diabetes.  No phone number to reach patient in his chart but I did speak with his daughter in law. She will check with patient to see if he has medications at home and if not order from pharmacy.    Madelin Ray, PharmD Clinical Pharmacist [facility] Primary Care  Population Health [phone]

## 2023-12-17 ENCOUNTER — Ambulatory Visit: Admitting: Physician Assistant

## 2023-12-17 ENCOUNTER — Other Ambulatory Visit (HOSPITAL_COMMUNITY): Payer: Self-pay

## 2023-12-17 ENCOUNTER — Encounter: Payer: Self-pay | Admitting: Physician Assistant

## 2023-12-17 ENCOUNTER — Other Ambulatory Visit: Payer: Self-pay

## 2023-12-17 DIAGNOSIS — L299 Pruritus, unspecified: Secondary | ICD-10-CM

## 2023-12-17 DIAGNOSIS — D489 Neoplasm of uncertain behavior, unspecified: Secondary | ICD-10-CM | POA: Diagnosis not present

## 2023-12-17 DIAGNOSIS — L986 Other infiltrative disorders of the skin and subcutaneous tissue: Secondary | ICD-10-CM

## 2023-12-17 MED ORDER — TRIAMCINOLONE ACETONIDE 0.1 % EX OINT
1.0000 | TOPICAL_OINTMENT | Freq: Two times a day (BID) | CUTANEOUS | 4 refills | Status: AC | PRN
  Filled 2023-12-17: qty 454, 30d supply, fill #0

## 2023-12-17 NOTE — Progress Notes (Signed)
   New Patient Visit   Subjective  Kevin Hutchinson is a 89 y.o. male NEW PATIENT who presents for the following: pruritus.   Patient states he has itchy rash on arms and legs but has been using triamcinolone  and it helps. Ongoing now x 5 months. Has known chronic hepatitis B.   Other concern: lesion on posterior left lower leg (calf). Painful to touch. Duration now x 2 years.   ACCOMPANIED BY [facility] AND TRANSLATOR # [id number] ([place] WE NEED TRANSLATOR TO GIVER RESULTS)  The following portions of the chart were reviewed this encounter and updated as appropriate: medications, allergies, medical history  Review of Systems:  No other skin or systemic complaints except as noted in HPI or Assessment and Plan.  Objective  Well appearing patient in no apparent distress; mood and affect are within normal limits.    A focused examination was performed of the following areas: face, neck, arms, hands and legs.   Relevant exam findings are noted in the Assessment and Plan.        Left Lower Leg - Posterior Erythematous scaly nodules   Assessment & Plan   PRURITUS   I counseled the patient regarding the following: Skin care: Recommend moisturizers, anti-histamines and sarna lotion. Expectations: Pruritus can be intermittent or persistent. Persistent cases can be due to dry skin, mite infestations, allergic reactions, neurodermatoses, or organic disease. For pruritus lasting longer than several months, CBC, TSH, LFTs, BUN/Crt, C-Xray may be warranted.  Treatment:  - continue triamcinolone  as needed.  - no sign of rash today - if recurs, I think biopsy may be warranted  - may be related to his hepatitis B      NEOPLASM OF UNCERTAIN BEHAVIOR Left Lower Leg - Posterior Skin / nail biopsy Type of biopsy: tangential   Informed consent: discussed and consent obtained   Timeout: patient name, date of birth, surgical site, and procedure verified   Procedure prep:  Patient was prepped and  draped in usual sterile fashion Prep type:  Isopropyl alcohol Anesthesia: the lesion was anesthetized in a standard fashion   Anesthetic:  1% lidocaine w/ epinephrine 1-100,000 buffered w/ 8.4% NaHCO3 Instrument used: flexible razor blade   Hemostasis achieved with: pressure, aluminum chloride and electrodesiccation   Outcome: patient tolerated procedure well   Post-procedure details: sterile dressing applied and wound care instructions given   Dressing type: bandage and petrolatum    Specimen 1 - Surgical pathology Differential Diagnosis: SCC VS OTHER  Check Margins: No PRURITUS   Related Medications triamcinolone  ointment (KENALOG ) 0.1 % Apply 1 Application topically 2 (two) times daily as needed (Rash).  Return for pending pathology .  I, Doyce Pan, CMA, am acting as scribe for Natsumi Whitsitt K, PA-C.   Documentation: I have reviewed the above documentation for accuracy and completeness, and I agree with the above.  Giavanni Zeitlin K, PA-C

## 2023-12-17 NOTE — Patient Instructions (Addendum)

## 2023-12-18 ENCOUNTER — Encounter: Payer: Self-pay | Admitting: Pharmacist

## 2023-12-18 ENCOUNTER — Other Ambulatory Visit: Payer: Self-pay

## 2023-12-18 ENCOUNTER — Other Ambulatory Visit (HOSPITAL_COMMUNITY): Payer: Self-pay

## 2023-12-22 LAB — SURGICAL PATHOLOGY

## 2023-12-23 ENCOUNTER — Other Ambulatory Visit: Payer: Self-pay

## 2024-01-06 ENCOUNTER — Telehealth: Payer: Self-pay | Admitting: Physician Assistant

## 2024-01-06 NOTE — Telephone Encounter (Signed)
 Based on scheduling message sent by provider, Kevin Hutchinson. Patient was to be contacted to schedule an appointment to go over results. We have attempted to contact patient or family via phone call. Patient does need an interpreter which we have utilized in all the attempts. No success and messages have been left on all numbers on file for patient.   1st attempt: LVM 10/30 MP 2nd attempt: LVM 11/4 MP 3rd attempt: LVM 11/6 MP 4th attempt: unable to LVM 11/11 MP

## 2024-01-17 IMAGING — US US ABDOMEN LIMITED
1 series · 14 of 25 positions shown · non-contrast
Comparison: Abdominal ultrasound 08/11/2020

CLINICAL DATA: Chronic viral hepatitis-B. Hepatocellular carcinoma
surveillance.

EXAM:
ULTRASOUND ABDOMEN LIMITED RIGHT UPPER QUADRANT

[Series 1: us abdomen limited · 0.22mm/px · 14 of 34 slices shown]
[im 1/34]
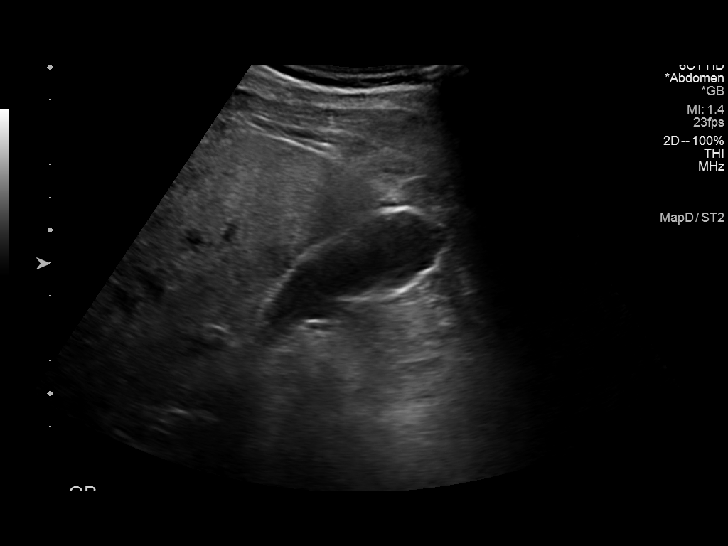
[im 3/34]
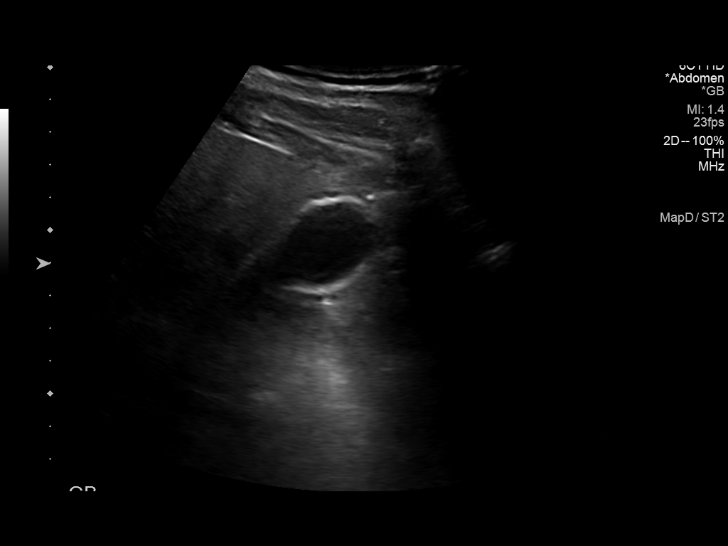
[im 6/34]
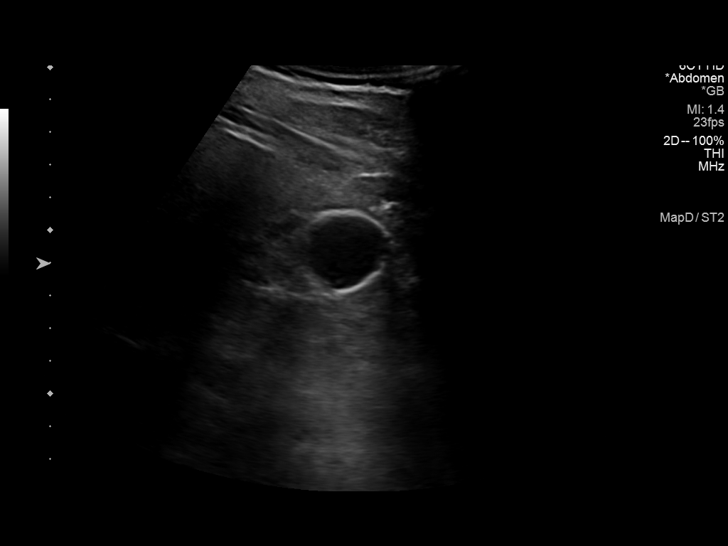
[im 9/34]
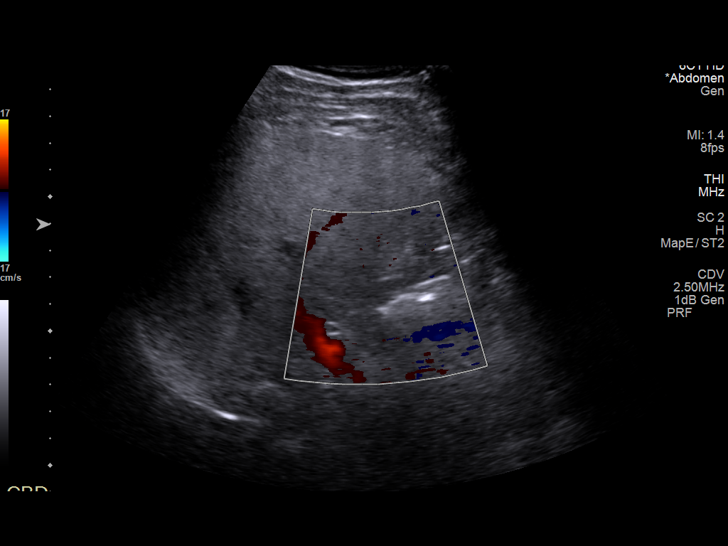
[im 12/34]
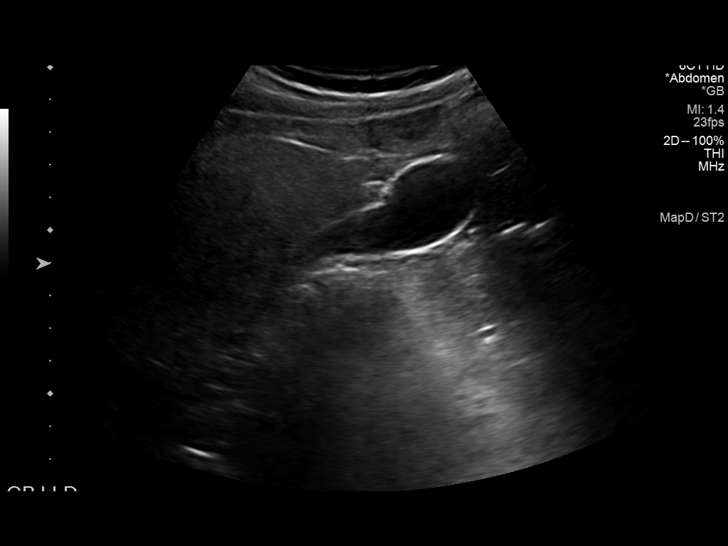
[im 13/34]
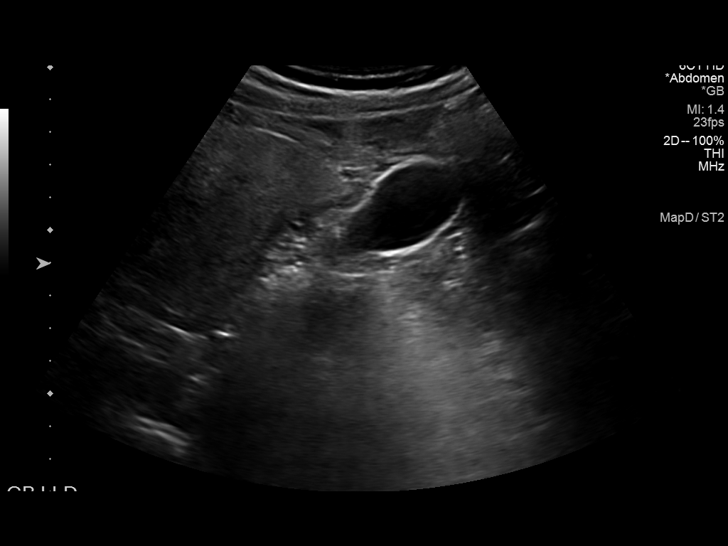
[im 16/34]
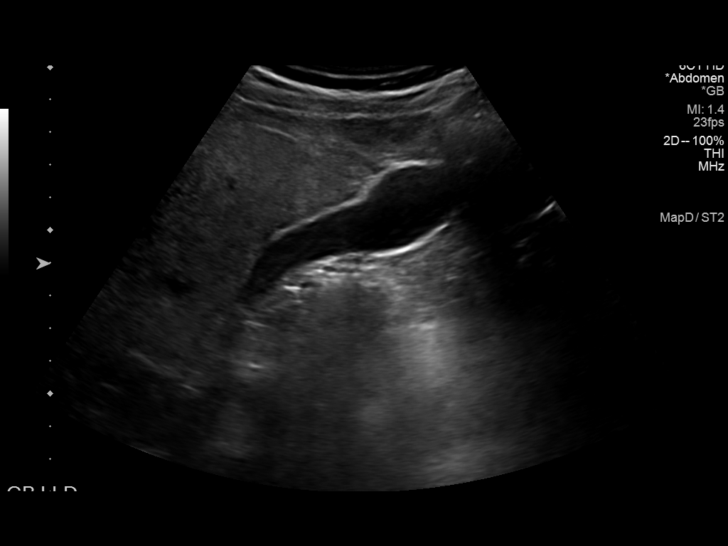
[im 18/34]
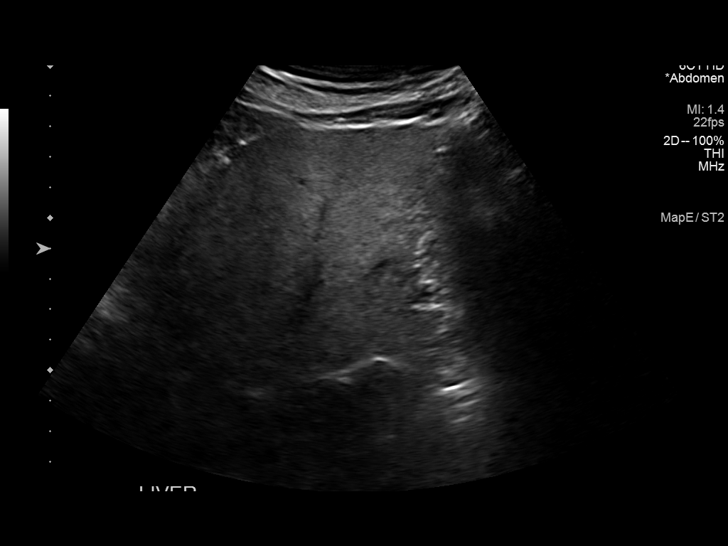
[im 21/34]
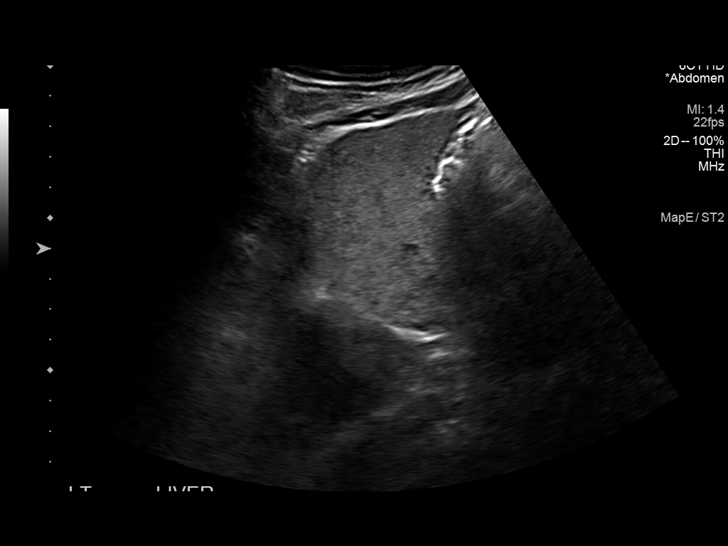
[im 23/34]
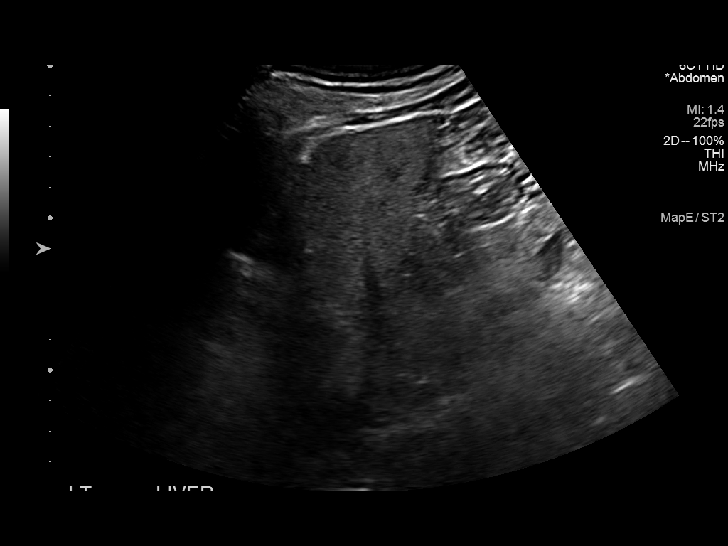
[im 25/34]
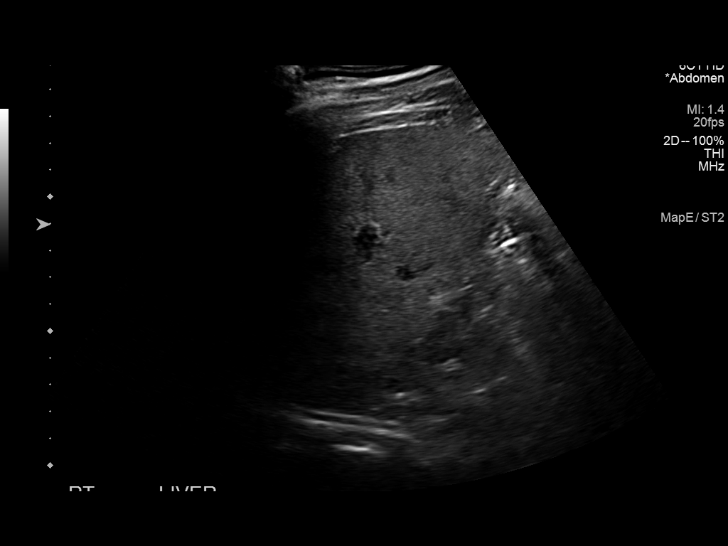
[im 28/34]
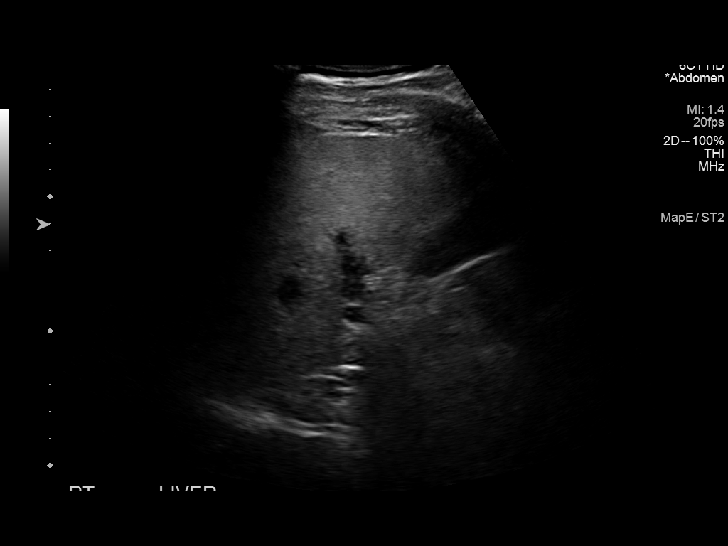
[im 31/34]
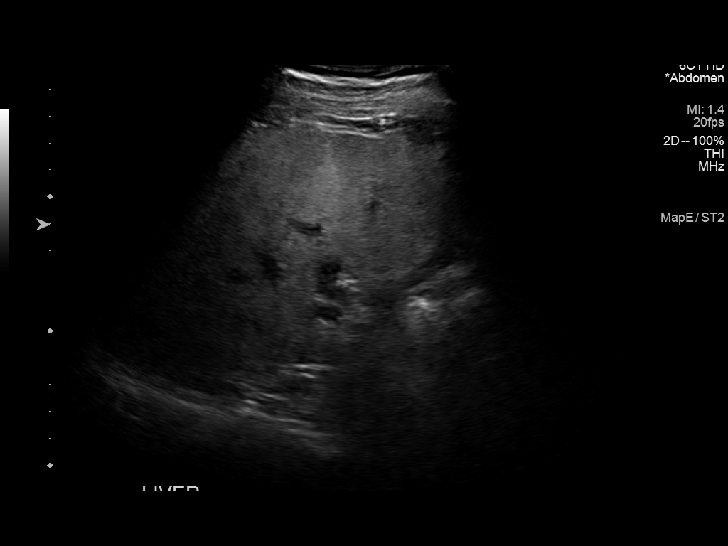
[im 34/34]
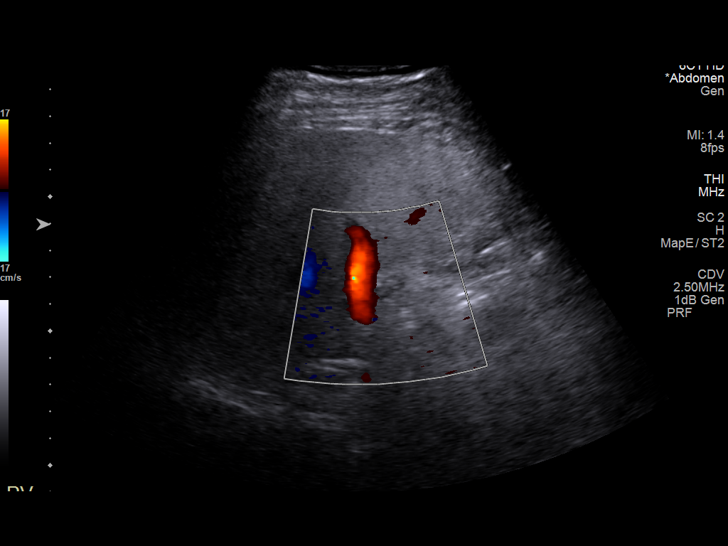

[14 of 25 positions shown; findings below may reference images not displayed]

FINDINGS: Gallbladder:

No gallstones or wall thickening visualized. No sonographic Murphy
sign noted by sonographer.

Common bile duct:

Diameter: 3

Liver:

No focal lesion identified. Smooth liver contours. Increased
echogenicity is again seen throughout the liver suggesting fatty
infiltration. Portal vein is patent on color Doppler imaging with
normal direction of blood flow towards the liver.

Other: None.
IMPRESSION: No significant change from prior.  Fatty liver.

## 2024-01-25 ENCOUNTER — Ambulatory Visit: Payer: Self-pay | Admitting: Physician Assistant

## 2024-01-25 NOTE — Progress Notes (Signed)
 PLEASE SEND LETTER as we have not been able to get in touch with patient.   Late entry - unable to get in touch with patient (language barrier)   Plan to send to Hem/Onc to r/o lymphoma.     1. Skin, left lower leg - posterior :       MIXED DERMAL LYMPHOCYTIC AND EOSINOPHILIC INFILTRATE, SEE DESCRIPTION

## 2024-04-05 ENCOUNTER — Ambulatory Visit: Admitting: Internal Medicine
